# Patient Record
Sex: Female | Born: 1960 | Race: Black or African American | Hispanic: No | Marital: Single | Smoking: Current every day smoker
Health system: Southern US, Community
[De-identification: ages and names within clinical notes are randomized; demographics above are authoritative.]

## PROBLEM LIST (undated history)

## (undated) DIAGNOSIS — R011 Cardiac murmur, unspecified: Secondary | ICD-10-CM

## (undated) DIAGNOSIS — N393 Stress incontinence (female) (male): Secondary | ICD-10-CM

## (undated) DIAGNOSIS — K219 Gastro-esophageal reflux disease without esophagitis: Secondary | ICD-10-CM

## (undated) DIAGNOSIS — I10 Essential (primary) hypertension: Secondary | ICD-10-CM

## (undated) DIAGNOSIS — E079 Disorder of thyroid, unspecified: Secondary | ICD-10-CM

## (undated) DIAGNOSIS — E039 Hypothyroidism, unspecified: Secondary | ICD-10-CM

## (undated) DIAGNOSIS — N8110 Cystocele, unspecified: Secondary | ICD-10-CM

## (undated) DIAGNOSIS — R928 Other abnormal and inconclusive findings on diagnostic imaging of breast: Secondary | ICD-10-CM

## (undated) HISTORY — PX: ROTATOR CUFF REPAIR: SHX139

## (undated) HISTORY — PX: THYROIDECTOMY: SHX17

---

## 2005-11-26 ENCOUNTER — Ambulatory Visit: Payer: Self-pay | Admitting: Otolaryngology

## 2005-11-30 ENCOUNTER — Other Ambulatory Visit: Payer: Self-pay

## 2005-12-10 ENCOUNTER — Ambulatory Visit: Payer: Self-pay | Admitting: Otolaryngology

## 2008-01-22 ENCOUNTER — Emergency Department (HOSPITAL_COMMUNITY): Admission: EM | Admit: 2008-01-22 | Discharge: 2008-01-22 | Payer: Self-pay | Admitting: Emergency Medicine

## 2008-02-02 ENCOUNTER — Emergency Department (HOSPITAL_COMMUNITY): Admission: EM | Admit: 2008-02-02 | Discharge: 2008-02-02 | Payer: Self-pay | Admitting: Emergency Medicine

## 2008-06-28 ENCOUNTER — Ambulatory Visit (HOSPITAL_COMMUNITY): Admission: RE | Admit: 2008-06-28 | Discharge: 2008-06-28 | Payer: Self-pay | Admitting: Family Medicine

## 2009-03-23 ENCOUNTER — Emergency Department (HOSPITAL_COMMUNITY): Admission: EM | Admit: 2009-03-23 | Discharge: 2009-03-23 | Payer: Self-pay | Admitting: Emergency Medicine

## 2010-02-21 ENCOUNTER — Ambulatory Visit (HOSPITAL_COMMUNITY): Admission: RE | Admit: 2010-02-21 | Discharge: 2010-02-21 | Payer: Self-pay | Admitting: Internal Medicine

## 2010-04-02 ENCOUNTER — Ambulatory Visit (HOSPITAL_COMMUNITY)
Admission: RE | Admit: 2010-04-02 | Discharge: 2010-04-02 | Payer: Self-pay | Source: Home / Self Care | Attending: Internal Medicine | Admitting: Internal Medicine

## 2010-05-18 ENCOUNTER — Encounter: Payer: Self-pay | Admitting: Internal Medicine

## 2010-08-07 ENCOUNTER — Other Ambulatory Visit (HOSPITAL_COMMUNITY): Payer: Self-pay | Admitting: Nurse Practitioner

## 2010-08-07 DIAGNOSIS — Z09 Encounter for follow-up examination after completed treatment for conditions other than malignant neoplasm: Secondary | ICD-10-CM

## 2010-10-08 ENCOUNTER — Ambulatory Visit (HOSPITAL_COMMUNITY): Payer: BC Managed Care – PPO

## 2010-10-08 ENCOUNTER — Other Ambulatory Visit (HOSPITAL_COMMUNITY): Payer: BC Managed Care – PPO

## 2010-11-05 ENCOUNTER — Ambulatory Visit (HOSPITAL_COMMUNITY)
Admission: RE | Admit: 2010-11-05 | Discharge: 2010-11-05 | Disposition: A | Discharge status: Home / Self Care | Payer: BC Managed Care – PPO | Source: Ambulatory Visit | Attending: Nurse Practitioner | Admitting: Nurse Practitioner

## 2010-11-05 DIAGNOSIS — Z09 Encounter for follow-up examination after completed treatment for conditions other than malignant neoplasm: Secondary | ICD-10-CM

## 2010-11-05 DIAGNOSIS — N63 Unspecified lump in unspecified breast: Secondary | ICD-10-CM | POA: Insufficient documentation

## 2011-05-07 ENCOUNTER — Emergency Department (HOSPITAL_COMMUNITY)
Admission: EM | Admit: 2011-05-07 | Discharge: 2011-05-07 | Disposition: A | Discharge status: Home / Self Care | Payer: BC Managed Care – PPO | Attending: Emergency Medicine | Admitting: Emergency Medicine

## 2011-05-07 ENCOUNTER — Encounter (HOSPITAL_COMMUNITY): Payer: Self-pay | Admitting: Emergency Medicine

## 2011-05-07 ENCOUNTER — Emergency Department (HOSPITAL_COMMUNITY): Payer: BC Managed Care – PPO

## 2011-05-07 DIAGNOSIS — R6883 Chills (without fever): Secondary | ICD-10-CM | POA: Insufficient documentation

## 2011-05-07 DIAGNOSIS — R059 Cough, unspecified: Secondary | ICD-10-CM

## 2011-05-07 DIAGNOSIS — IMO0001 Reserved for inherently not codable concepts without codable children: Secondary | ICD-10-CM | POA: Insufficient documentation

## 2011-05-07 DIAGNOSIS — J3489 Other specified disorders of nose and nasal sinuses: Secondary | ICD-10-CM | POA: Insufficient documentation

## 2011-05-07 DIAGNOSIS — F17200 Nicotine dependence, unspecified, uncomplicated: Secondary | ICD-10-CM | POA: Insufficient documentation

## 2011-05-07 DIAGNOSIS — R05 Cough: Secondary | ICD-10-CM | POA: Insufficient documentation

## 2011-05-07 DIAGNOSIS — R6889 Other general symptoms and signs: Secondary | ICD-10-CM | POA: Insufficient documentation

## 2011-05-07 DIAGNOSIS — Z794 Long term (current) use of insulin: Secondary | ICD-10-CM | POA: Insufficient documentation

## 2011-05-07 MED ORDER — AZITHROMYCIN 250 MG PO TABS
500.0000 mg | ORAL_TABLET | Freq: Once | ORAL | Status: AC
  Administered 2011-05-07: 500 mg via ORAL
  Filled 2011-05-07: qty 2

## 2011-05-07 MED ORDER — HYDROCOD POLST-CHLORPHEN POLST 10-8 MG/5ML PO LQCR: 5.0000 mL | Freq: Two times a day (BID) | ORAL | Status: DC | PRN

## 2011-05-07 MED ORDER — AZITHROMYCIN 250 MG PO TABS: ORAL_TABLET | ORAL | Status: DC

## 2011-05-07 MED ORDER — IBUPROFEN 800 MG PO TABS
800.0000 mg | ORAL_TABLET | Freq: Once | ORAL | Status: AC
  Administered 2011-05-07: 800 mg via ORAL
  Filled 2011-05-07: qty 1

## 2011-05-07 MED ORDER — IPRATROPIUM BROMIDE 0.02 % IN SOLN
0.5000 mg | Freq: Once | RESPIRATORY_TRACT | Status: AC
  Administered 2011-05-07: 0.5 mg via RESPIRATORY_TRACT
  Filled 2011-05-07: qty 2.5

## 2011-05-07 MED ORDER — ALBUTEROL SULFATE HFA 108 (90 BASE) MCG/ACT IN AERS
2.0000 | INHALATION_SPRAY | RESPIRATORY_TRACT | Status: DC | PRN
  Administered 2011-05-07: 2 via RESPIRATORY_TRACT
  Filled 2011-05-07: qty 6.7

## 2011-05-07 MED ORDER — ALBUTEROL SULFATE (5 MG/ML) 0.5% IN NEBU
5.0000 mg | INHALATION_SOLUTION | Freq: Once | RESPIRATORY_TRACT | Status: AC
  Administered 2011-05-07: 5 mg via RESPIRATORY_TRACT
  Filled 2011-05-07: qty 1

## 2011-05-07 NOTE — ED Notes (Signed)
Pt c/o cough/congestion/body aches.

## 2011-05-07 NOTE — ED Provider Notes (Signed)
History     CSN: [id number]  Arrival date & time 05/07/11  1316   First MD Initiated Contact with Patient 05/07/11 1608      Chief Complaint  Patient presents with  . Cough  . Nasal Congestion  . Generalized Body Aches    (Consider location/radiation/quality/duration/timing/severity/associated sxs/prior treatment) Patient is a 51 y.o. female presenting with cough. The history is provided by the patient. No language interpreter was used.  Cough This is a new problem. The current episode started more than 2 days ago. The problem occurs constantly. The problem has not changed since onset.The cough is non-productive. There has been no fever. Associated symptoms include chills, rhinorrhea and myalgias. Pertinent negatives include no ear pain, no headaches, no sore throat, no shortness of breath and no wheezing. She has tried nothing for the symptoms. The treatment provided no relief. She is a smoker. Her past medical history does not include bronchitis, pneumonia, COPD or asthma.    History reviewed. No pertinent past medical history.  Past Surgical History  Procedure Date  . Thyroidectomy   . Rotator cuff repair     History reviewed. No pertinent family history.  History  Substance Use Topics  . Smoking status: Current Everyday Smoker  . Smokeless tobacco: Not on file  . Alcohol Use: No    OB History    Grav Para Term Preterm Abortions TAB SAB Ect Mult Living                  Review of Systems  Constitutional: Positive for chills. Negative for activity change and appetite change.  HENT: Positive for congestion, rhinorrhea and sneezing. Negative for ear pain, sore throat, trouble swallowing, neck pain and neck stiffness.   Eyes: Negative for visual disturbance.  Respiratory: Positive for cough. Negative for chest tightness, shortness of breath and wheezing.   Gastrointestinal: Negative for vomiting and abdominal pain.  Genitourinary: Negative for dysuria.    Musculoskeletal: Positive for myalgias. Negative for arthralgias.  Skin: Negative.   Neurological: Negative for dizziness, weakness, numbness and headaches.  All other systems reviewed and are negative.    Allergies  Review of patient's allergies indicates no known allergies.  Home Medications   Current Outpatient Rx  Name Route Sig Dispense Refill  . INSULIN ASPART 100 UNIT/ML [place] SOLN Subcutaneous Inject 5-8 Units into the skin 3 (three) times daily before meals.    . INSULIN DETEMIR 100 UNIT/ML [place] SOLN Subcutaneous Inject 10 Units into the skin at bedtime.    Marland Kitchen LOSARTAN POTASSIUM 50 MG PO TABS Oral Take 50 mg by mouth daily.    Marland Kitchen METFORMIN HCL 1000 MG PO TABS Oral Take 1,000 mg by mouth 2 (two) times daily with a meal.    . OMEPRAZOLE 20 MG PO CPDR Oral Take 20 mg by mouth daily.    Marland Kitchen PRAVASTATIN SODIUM 20 MG PO TABS Oral Take 20 mg by mouth daily.      BP 157/62  Pulse 98  Temp 99.5 F (37.5 C)  Resp 20  Ht 5\' 11"  (1.803 m)  Wt 196 lb (88.905 kg)  BMI 27.34 kg/m2  SpO2 97%  Physical Exam  Nursing note reviewed. Constitutional: She is oriented to person, place, and time. She appears well-developed and well-nourished. No distress.  HENT:  Head: Normocephalic and atraumatic.  Right Ear: Tympanic membrane and ear canal normal.  Left Ear: Tympanic membrane and ear canal normal.  Nose: Rhinorrhea present. No mucosal edema.  Mouth/Throat: Uvula is midline,  oropharynx is clear and moist and mucous membranes are normal. No oropharyngeal exudate.  Neck: Normal range of motion.  Cardiovascular: Normal rate, regular rhythm and normal heart sounds.   No murmur heard. Pulmonary/Chest: Effort normal and breath sounds normal. No respiratory distress. She has no wheezes. She has no rales.  Musculoskeletal: She exhibits no tenderness.  Lymphadenopathy:    She has no cervical adenopathy.  Neurological: She is alert and oriented to person, place, and time. She exhibits normal muscle  tone. Coordination normal.  Skin: Skin is warm and dry.    ED Course  Procedures (including critical care time)  Labs Reviewed - No data to display Dg Chest 2 View  05/07/2011  *RADIOLOGY REPORT*  Clinical Data: Cough  CHEST - 2 VIEW  Comparison: 03/23/2009, 01/22/2008  Findings: Normal heart size, mediastinal contours, and pulmonary vascularity. Minimal chronic peribronchial thickening without infiltrate or effusion. No pneumothorax. Bones unremarkable.  IMPRESSION: Minimal chronic bronchitic changes. No acute abnormalities.  Original Report Authenticated By: Lollie Marrow, M.D.        MDM    Patient is alert, NAD.  No tachypnea or hypoxia.  Non-toxic appearing.  Appears stable for d/c.  She agrees to f/u with her PMD or return here if sx's worsen  Patient / Family / Caregiver understand and agree with initial ED impression and plan with expectations set for ED visit.   Pt stable in ED with no significant deterioration in condition.       Bradrick Kamau L. Richards [place], [place] 05/09/11 2313

## 2011-05-10 NOTE — ED Provider Notes (Signed)
Medical screening examination/treatment/procedure(s) were performed by non-physician practitioner and as supervising physician I was immediately available for consultation/collaboration.   Joya Gaskins, MD 05/10/11 [phone]

## 2011-07-22 ENCOUNTER — Other Ambulatory Visit (HOSPITAL_COMMUNITY): Payer: Self-pay | Admitting: Nurse Practitioner

## 2011-07-22 DIAGNOSIS — Z09 Encounter for follow-up examination after completed treatment for conditions other than malignant neoplasm: Secondary | ICD-10-CM

## 2011-07-29 ENCOUNTER — Other Ambulatory Visit (HOSPITAL_COMMUNITY): Payer: Self-pay | Admitting: Nurse Practitioner

## 2011-07-29 ENCOUNTER — Ambulatory Visit (HOSPITAL_COMMUNITY)
Admission: RE | Admit: 2011-07-29 | Discharge: 2011-07-29 | Disposition: A | Discharge status: Home / Self Care | Payer: BC Managed Care – PPO | Source: Ambulatory Visit | Attending: Nurse Practitioner | Admitting: Nurse Practitioner

## 2011-07-29 DIAGNOSIS — Z09 Encounter for follow-up examination after completed treatment for conditions other than malignant neoplasm: Secondary | ICD-10-CM

## 2011-07-29 DIAGNOSIS — N63 Unspecified lump in unspecified breast: Secondary | ICD-10-CM | POA: Insufficient documentation

## 2011-12-30 ENCOUNTER — Encounter (HOSPITAL_COMMUNITY): Payer: Self-pay

## 2011-12-30 ENCOUNTER — Emergency Department (HOSPITAL_COMMUNITY)
Admission: EM | Admit: 2011-12-30 | Discharge: 2011-12-30 | Disposition: A | Discharge status: Home / Self Care | Payer: BC Managed Care – PPO | Attending: Emergency Medicine | Admitting: Emergency Medicine

## 2011-12-30 DIAGNOSIS — J019 Acute sinusitis, unspecified: Secondary | ICD-10-CM

## 2011-12-30 DIAGNOSIS — E119 Type 2 diabetes mellitus without complications: Secondary | ICD-10-CM | POA: Insufficient documentation

## 2011-12-30 DIAGNOSIS — F17200 Nicotine dependence, unspecified, uncomplicated: Secondary | ICD-10-CM | POA: Insufficient documentation

## 2011-12-30 DIAGNOSIS — Z79899 Other long term (current) drug therapy: Secondary | ICD-10-CM | POA: Insufficient documentation

## 2011-12-30 MED ORDER — AZITHROMYCIN 250 MG PO TABS: ORAL_TABLET | ORAL | Status: DC

## 2011-12-30 MED ORDER — FLUCONAZOLE 150 MG PO TABS: 150.0000 mg | ORAL_TABLET | Freq: Every day | ORAL | Status: AC

## 2011-12-30 NOTE — ED Notes (Signed)
Pt presents with c/o sinus pressure headache, chest discomfort, productive cough, nasal congestion and generalized body aches. Pt states became sick with sinus pressure and nasal congestion on Saturday and symptoms have progressed to URI discomfort. Pt reports coughing so much she aches every where. Denies fever, N/V/D. Pt is in no acute respiratory distress at this time.

## 2011-12-30 NOTE — ED Notes (Signed)
Pt reports nasal congestion and sinus headache, cough since sunday

## 2011-12-30 NOTE — ED Provider Notes (Signed)
History     CSN: [id number]  Arrival date & time 12/30/11  1123   First MD Initiated Contact with Patient 12/30/11 1301      Chief Complaint  Patient presents with  . Nasal Congestion  . Headache    (Consider location/radiation/quality/duration/timing/severity/associated sxs/prior treatment) HPI Comments: Monica Bell presents with a three-day history of nasal congestion, sinus headache nonproductive cough and generalized body aches.  She denies fevers and chills and has had no chest pain or shortness of breath.  She does have a history of prior sinusitis and she states her symptoms today are similar.  She has facial pressure primarily in her cheeks and across her nose.  She has had bilateral nasal congestion and nonbloody  green nasal discharge.  She does use Flonase when necessary and has been using this for the past 2 days without relief of her nasal congestion.   The history is provided by the patient.    Past Medical History  Diagnosis Date  . Diabetes mellitus     Past Surgical History  Procedure Date  . Thyroidectomy   . Rotator cuff repair     No family history on file.  History  Substance Use Topics  . Smoking status: Current Everyday Smoker    Types: Cigarettes  . Smokeless tobacco: Not on file  . Alcohol Use: No    OB History    Grav Para Term Preterm Abortions TAB SAB Ect Mult Living                  Review of Systems  Constitutional: Negative for fever.  HENT: Positive for congestion, rhinorrhea, postnasal drip and sinus pressure. Negative for sore throat, facial swelling, trouble swallowing and neck pain.   Eyes: Negative.   Respiratory: Positive for cough. Negative for chest tightness, shortness of breath, wheezing and stridor.   Cardiovascular: Negative for chest pain.  Gastrointestinal: Negative for nausea and abdominal pain.  Genitourinary: Negative.   Musculoskeletal: Negative for joint swelling and arthralgias.  Skin: Negative.   Negative for rash and wound.  Neurological: Negative for dizziness, weakness, light-headedness, numbness and headaches.  Hematological: Negative.   Psychiatric/Behavioral: Negative.     Allergies  Review of patient's allergies indicates no known allergies.  Home Medications   Current Outpatient Rx  Name Route Sig Dispense Refill  . INSULIN ASPART 100 UNIT/ML [place] SOLN Subcutaneous Inject 10 Units into the skin 4 (four) times daily -  before meals and at bedtime. MD also told her to take a little bit before snacks also    . INSULIN DETEMIR 100 UNIT/ML [place] SOLN Subcutaneous Inject 35 Units into the skin daily.    Marland Kitchen LOSARTAN POTASSIUM 50 MG PO TABS Oral Take 50 mg by mouth daily.    Marland Kitchen METFORMIN HCL 1000 MG PO TABS Oral Take 1,000 mg by mouth 2 (two) times daily with a meal.    . OMEPRAZOLE 20 MG PO CPDR Oral Take 20 mg by mouth daily.    Marland Kitchen PRAVASTATIN SODIUM 20 MG PO TABS Oral Take 20 mg by mouth at bedtime.     . AZITHROMYCIN 250 MG PO TABS  Take 2 tablets by mouth on day one followed by one tablet daily for 4 days. 6 tablet 0  . FLUCONAZOLE 150 MG PO TABS Oral Take 1 tablet (150 mg total) by mouth daily. 1 tablet 0    Take prn antibiotic induced vaginitis    BP 147/66  Pulse 80  Temp 98 F (  36.7 C) (Oral)  Resp 20  Ht 5\' 8"  (1.727 m)  Wt 196 lb (88.905 kg)  BMI 29.80 kg/m2  SpO2 99%  Physical Exam  Nursing note and vitals reviewed. Constitutional: She appears well-developed and well-nourished.  HENT:  Head: Normocephalic and atraumatic.  Right Ear: External ear normal.  Left Ear: External ear normal.  Nose: Mucosal edema and rhinorrhea present. Right sinus exhibits maxillary sinus tenderness. Left sinus exhibits maxillary sinus tenderness.  Mouth/Throat: Uvula is midline, oropharynx is clear and moist and mucous membranes are normal.  Eyes: Conjunctivae are normal.  Neck: Normal range of motion.  Cardiovascular: Normal rate, regular rhythm, normal heart sounds and intact  distal pulses.   Pulmonary/Chest: Effort normal and breath sounds normal. No respiratory distress. She has no wheezes. She has no rales.  Abdominal: Soft.  Musculoskeletal: Normal range of motion.  Neurological: She is alert.  Skin: Skin is warm and dry.  Psychiatric: She has a normal mood and affect.    ED Course  Procedures (including critical care time)  Labs Reviewed - No data to display No results found.   1. Sinusitis, acute       MDM  Patient with acute sinusitis by history and exam.  Discussed treatment with antibiotics.  Patient deferred on amoxicillin or Augmentin stating to Zithromax works better for her.  Also states she typically will have a yeast infection while being on antibiotics so she was also prescribed Diflucan tablet.  She was encouraged to continue using her Flonase, also continue with warm compresses to her face, Tylenol for pain relief, menthol cough drops for temporary nasal decongestant purposes.  Follow up with PCP if not improving over the next several days.        Burgess Amor, PA 12/30/11 1710

## 2011-12-31 NOTE — ED Provider Notes (Signed)
Medical screening examination/treatment/procedure(s) were performed by non-physician practitioner and as supervising physician I was immediately available for consultation/collaboration. Zekiah Coen, MD, FACEP   Alize Acy L Blue Ruggerio, MD 12/31/11 1531 

## 2012-02-26 DIAGNOSIS — IMO0002 Reserved for concepts with insufficient information to code with codable children: Secondary | ICD-10-CM | POA: Insufficient documentation

## 2012-02-26 DIAGNOSIS — F17200 Nicotine dependence, unspecified, uncomplicated: Secondary | ICD-10-CM | POA: Insufficient documentation

## 2012-02-26 DIAGNOSIS — N393 Stress incontinence (female) (male): Secondary | ICD-10-CM | POA: Insufficient documentation

## 2012-02-26 DIAGNOSIS — E039 Hypothyroidism, unspecified: Secondary | ICD-10-CM | POA: Insufficient documentation

## 2012-02-26 DIAGNOSIS — G8929 Other chronic pain: Secondary | ICD-10-CM | POA: Insufficient documentation

## 2012-11-08 ENCOUNTER — Other Ambulatory Visit (HOSPITAL_COMMUNITY): Payer: Self-pay | Admitting: Family Medicine

## 2012-11-08 DIAGNOSIS — Z139 Encounter for screening, unspecified: Secondary | ICD-10-CM

## 2012-11-14 ENCOUNTER — Ambulatory Visit (HOSPITAL_COMMUNITY)
Admission: RE | Admit: 2012-11-14 | Discharge: 2012-11-14 | Disposition: A | Discharge status: Home / Self Care | Payer: BC Managed Care – PPO | Source: Ambulatory Visit | Attending: Family Medicine | Admitting: Family Medicine

## 2012-11-14 DIAGNOSIS — Z139 Encounter for screening, unspecified: Secondary | ICD-10-CM

## 2012-11-14 DIAGNOSIS — Z1231 Encounter for screening mammogram for malignant neoplasm of breast: Secondary | ICD-10-CM | POA: Insufficient documentation

## 2013-04-02 IMAGING — US US BREAST*L*
1 series · 5 of 5 positions shown · non-contrast
Comparison: Prior mammograms and ultrasound dated 04/02/2010

On physical exam, I do not palpate a mass in the left breast.

CLINICAL DATA: Short-term interval follow-up of a probable benign
lesion in the left breast

LEFT BREAST ULTRASOUND

[Series 1: us breast*left* · 0.09mm/px · 5 of 5 slices shown]
[im 1/5]
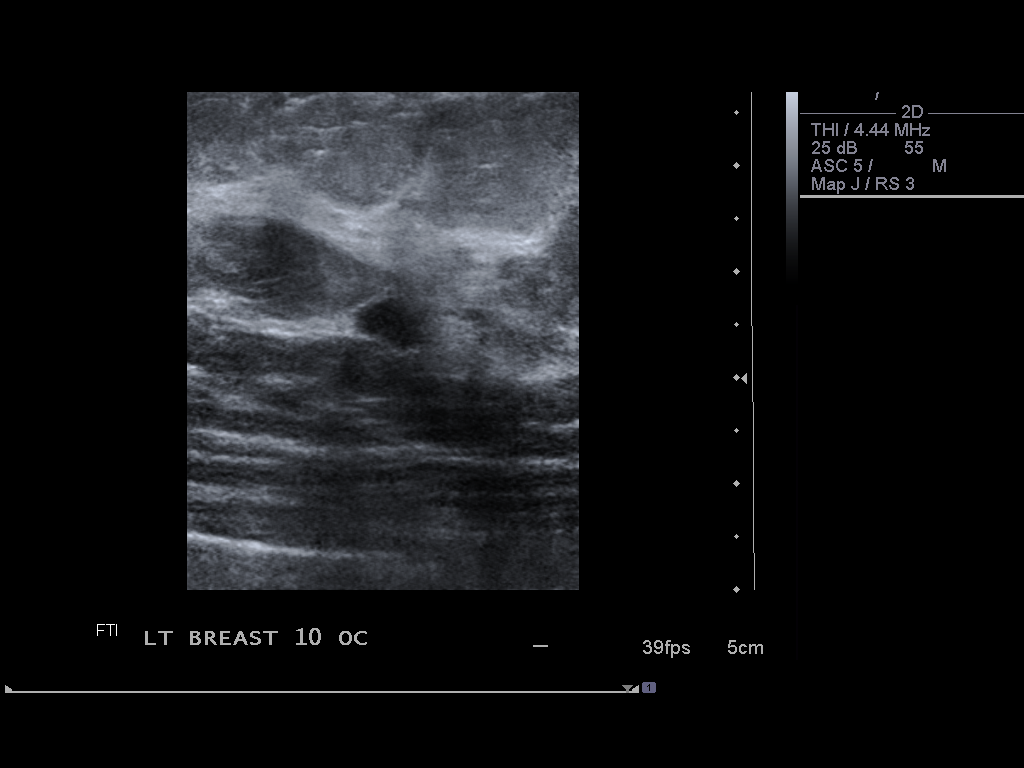
[im 2/5]
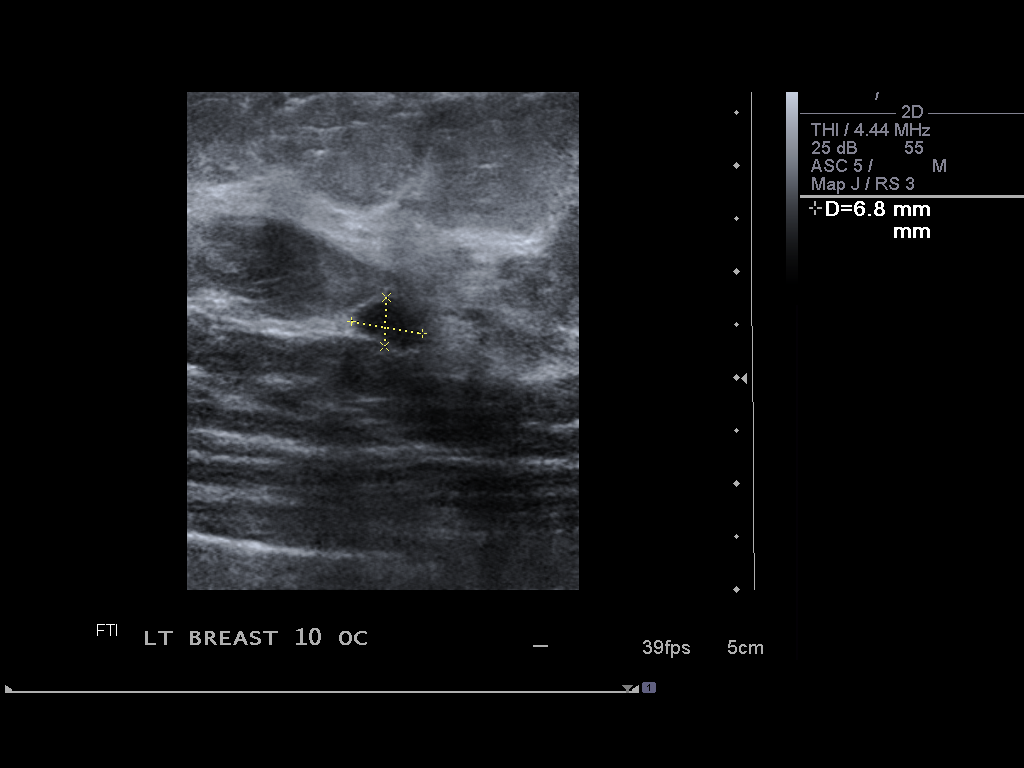
[im 3/5]
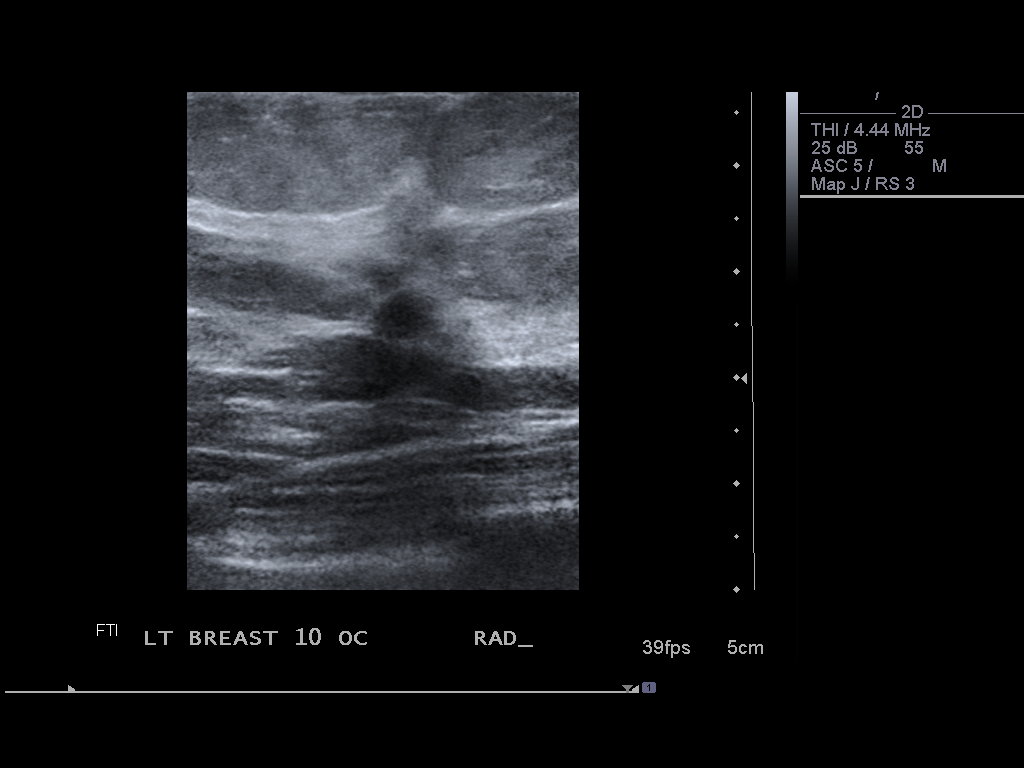
[im 4/5]
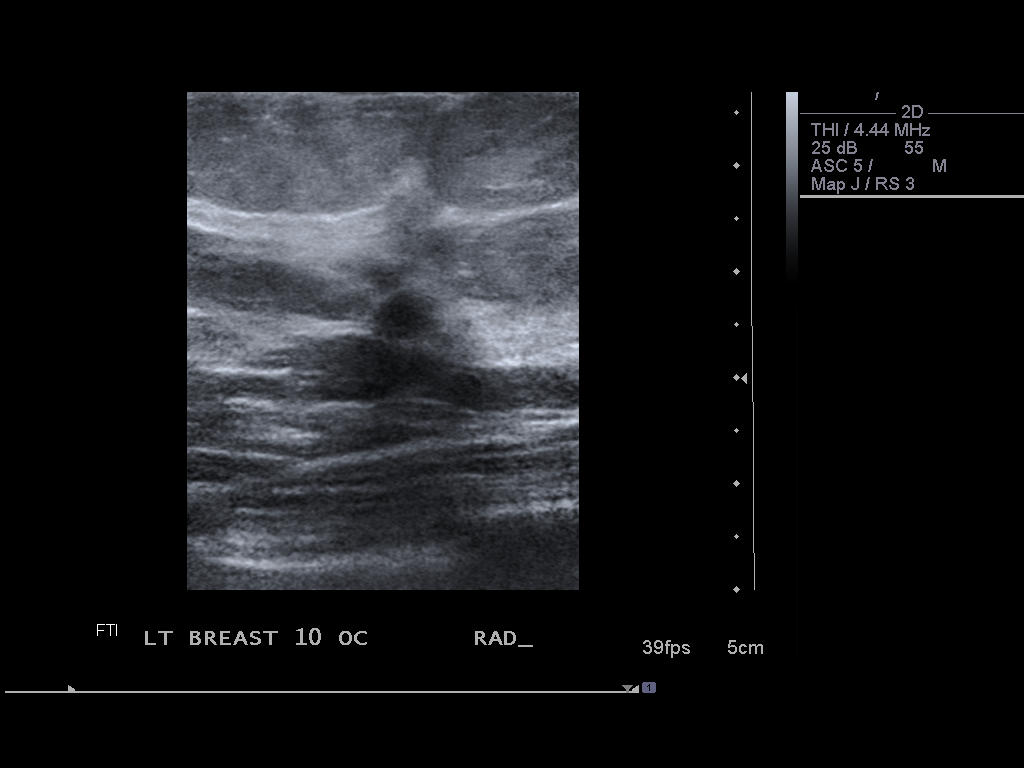
[im 5/5]
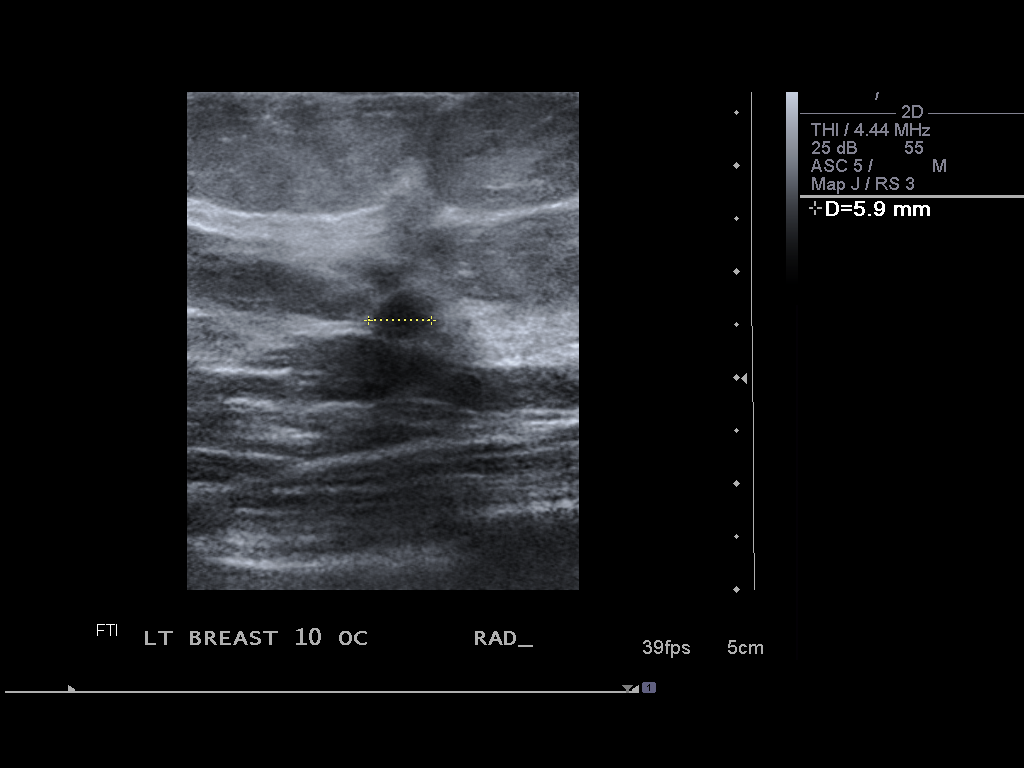

[5 of 5 positions shown; findings below may reference images not displayed]

FINDINGS: Ultrasound is performed, showing there is a hypoechoic
lesion in the left breast at 10 o'clock 5 cm from the nipple
measuring 7 x 5 x 6 mm.  On the prior ultrasound dated 04/02/2010
this measured 9 mm.  This likely a benign cyst.
IMPRESSION: Probable benign lesion in the left breast.  Short-term interval
follow-up bilateral diagnostic mammogram and left breast ultrasound
in March 2011 is recommended

BI-RADS CATEGORY 3:  Probably benign finding(s) - short interval
follow-up suggested.

## 2013-06-04 ENCOUNTER — Encounter (HOSPITAL_COMMUNITY): Payer: Self-pay | Admitting: Emergency Medicine

## 2013-06-04 ENCOUNTER — Emergency Department (HOSPITAL_COMMUNITY)
Admission: EM | Admit: 2013-06-04 | Discharge: 2013-06-04 | Disposition: A | Discharge status: Home / Self Care | Payer: BC Managed Care – PPO | Attending: Emergency Medicine | Admitting: Emergency Medicine

## 2013-06-04 DIAGNOSIS — F17200 Nicotine dependence, unspecified, uncomplicated: Secondary | ICD-10-CM | POA: Insufficient documentation

## 2013-06-04 DIAGNOSIS — Z794 Long term (current) use of insulin: Secondary | ICD-10-CM | POA: Insufficient documentation

## 2013-06-04 DIAGNOSIS — Z79899 Other long term (current) drug therapy: Secondary | ICD-10-CM | POA: Insufficient documentation

## 2013-06-04 DIAGNOSIS — R52 Pain, unspecified: Secondary | ICD-10-CM | POA: Insufficient documentation

## 2013-06-04 DIAGNOSIS — I10 Essential (primary) hypertension: Secondary | ICD-10-CM | POA: Insufficient documentation

## 2013-06-04 DIAGNOSIS — J329 Chronic sinusitis, unspecified: Secondary | ICD-10-CM

## 2013-06-04 DIAGNOSIS — E119 Type 2 diabetes mellitus without complications: Secondary | ICD-10-CM | POA: Insufficient documentation

## 2013-06-04 LAB — GLUCOSE, CAPILLARY: Glucose-Capillary: 136 mg/dL — ABNORMAL HIGH (ref 70–99)

## 2013-06-04 MED ORDER — AZITHROMYCIN 250 MG PO TABS
500.0000 mg | ORAL_TABLET | Freq: Every day | ORAL | Status: DC
  Administered 2013-06-04: 500 mg via ORAL
  Filled 2013-06-04: qty 2

## 2013-06-04 MED ORDER — AZITHROMYCIN 250 MG PO TABS: ORAL_TABLET | ORAL | Status: DC

## 2013-06-04 NOTE — ED Provider Notes (Signed)
Medical screening examination/treatment/procedure(s) were performed by non-physician practitioner and as supervising physician I was immediately available for consultation/collaboration.  EKG Interpretation   None         Elienai Gailey, MD 06/04/13 2332 

## 2013-06-04 NOTE — ED Notes (Addendum)
Pt c/o nasal congestion, sinus congestion, facial and generalized body aches, fever that started this week, was seen by pcp on Wednesday and advised "it will run it's course" pt states that she is not getting any better, denies any n/v/d, cough,

## 2013-06-04 NOTE — ED Provider Notes (Signed)
CSN: [id number]     Arrival date & time 06/04/13  1454 History  This chart was scribed for non-physician practitioner, Kem Parkinson, PA-C,working with Ezequiel Essex, MD, by Marlowe Kays, ED Scribe.  This patient was seen in room APFT21/APFT21 and the patient's care was started at 3:44 PM.  Chief Complaint  Patient presents with  . Facial Pain  . Nasal Congestion   The history is provided by the patient. No language interpreter was used.   HPI Comments:  Monica Bell is a 53 y.o. female who presents to the Emergency Department complaining of generalized body aches, fatigue, congestion, and facial pain that started approximately one week ago. Pt states she went to her PCP at [facility] three days ago and was prescribed Tussionex. She states she is still experiencing nasal congestion and sneezing. She reports fever 2-3 days ago, but does not have one now. She reports associated chest tightness secondary to excessive coughing and sneezing. She reports sneezing wakes her at night. She reports a sick contact stating her daughter has had a severe cough, but states she does not feel it is the same type of illness. She denies sore throat, current fever, abdominal pain or SOB. She reports h/o DM and HTN. She reports her CBGs have been fluctuating since she got sick, but nothing extremely elevated.  Past Medical History  Diagnosis Date  . Diabetes mellitus    Past Surgical History  Procedure Laterality Date  . Thyroidectomy    . Rotator cuff repair     No family history on file. History  Substance Use Topics  . Smoking status: Current Every Day Smoker    Types: Cigarettes  . Smokeless tobacco: Not on file  . Alcohol Use: No   OB History   Grav Para Term Preterm Abortions TAB SAB Ect Mult Living                 Review of Systems  Constitutional: Negative for fever, chills and fatigue.       Generalized body aches  HENT: Positive for congestion (nasal) and sinus  pressure. Negative for sore throat and trouble swallowing.   Respiratory: Positive for cough. Negative for shortness of breath and wheezing.   Cardiovascular: Negative for chest pain and palpitations.  Gastrointestinal: Negative for nausea, vomiting, abdominal pain and blood in stool.  Genitourinary: Negative for dysuria, hematuria and flank pain.  Musculoskeletal: Negative for arthralgias, back pain, myalgias, neck pain and neck stiffness.  Skin: Negative for rash.  Neurological: Negative for dizziness, weakness and numbness.  Hematological: Does not bruise/bleed easily.  All other systems reviewed and are negative.    Allergies  Review of patient's allergies indicates no known allergies.  Home Medications   Current Outpatient Rx  Name  Route  Sig  Dispense  Refill  . azithromycin (ZITHROMAX Z-PAK) 250 MG tablet      Take 2 tablets by mouth on day one followed by one tablet daily for 4 days.   6 tablet   0   . insulin aspart (NOVOLOG) 100 UNIT/ML injection   Subcutaneous   Inject 10 Units into the skin 4 (four) times daily -  before meals and at bedtime. MD also told her to take a little bit before snacks also         . insulin detemir (LEVEMIR) 100 UNIT/ML injection   Subcutaneous   Inject 35 Units into the skin daily.         Marland Kitchen losartan (COZAAR)  50 MG tablet   Oral   Take 50 mg by mouth daily.         . metFORMIN (GLUCOPHAGE) 1000 MG tablet   Oral   Take 1,000 mg by mouth 2 (two) times daily with a meal.         . omeprazole (PRILOSEC) 20 MG capsule   Oral   Take 20 mg by mouth daily.         . pravastatin (PRAVACHOL) 20 MG tablet   Oral   Take 20 mg by mouth at bedtime.           Triage Vitals: BP 129/72  Pulse 96  Temp(Src) 98.9 F (37.2 C) (Oral)  Resp 20  Ht 5\' 11"  (1.803 m)  Wt 198 lb (89.812 kg)  BMI 27.63 kg/m2  SpO2 98% Physical Exam  Nursing note and vitals reviewed. Constitutional: She is oriented to person, place, and time. She  appears well-developed and well-nourished. No distress.  HENT:  Head: Normocephalic and atraumatic.  Right Ear: Hearing, tympanic membrane, external ear and ear canal normal.  Left Ear: Hearing, tympanic membrane, external ear and ear canal normal.  Nose: Mucosal edema (slightly boggy) present. No rhinorrhea or nasal deformity. Right sinus exhibits frontal sinus tenderness. Left sinus exhibits frontal sinus tenderness.  Mouth/Throat: Uvula is midline, oropharynx is clear and moist and mucous membranes are normal. No trismus in the jaw. No uvula swelling. No oropharyngeal exudate, posterior oropharyngeal edema, posterior oropharyngeal erythema or tonsillar abscesses.  Eyes: Conjunctivae are normal.  Neck: Normal range of motion and phonation normal. Neck supple. No Brudzinski's sign and no Kernig's sign noted.  Cardiovascular: Normal rate, regular rhythm, normal heart sounds and intact distal pulses.   No murmur heard. Pulmonary/Chest: Effort normal and breath sounds normal. No respiratory distress. She has no wheezes. She has no rales. She exhibits no tenderness.  Abdominal: Soft. She exhibits no distension. There is no tenderness. There is no rebound and no guarding.  Musculoskeletal: Normal range of motion. She exhibits no edema.  Lymphadenopathy:    She has no cervical adenopathy.  Neurological: She is alert and oriented to person, place, and time. She exhibits normal muscle tone. Coordination normal.  Skin: Skin is warm and dry.    ED Course  Procedures (including critical care time) DIAGNOSTIC STUDIES: Oxygen Saturation is 98% on RA, normal by my interpretation.   COORDINATION OF CARE: 3:52 PM- Will prescribe Zithromax and advised pt to try OTC nascort. Advised pt to be mindful of her CBG levels and discontinue Nasacort if they elevate. Pt verbalizes understanding and agrees to plan.  Medications - No data to display  Labs Review Labs Reviewed  GLUCOSE, CAPILLARY - Abnormal;  Notable for the following:    Glucose-Capillary 136 (*)    All other components within normal limits   Imaging Review No results found.  EKG Interpretation   None       MDM  Pt is well appearing, VSS.  Likely sinusitis.  Reported chest pain is associated with excessive cough and sneezing only.  Wells PE score is 0.  No dyspnea. Patient agrees to zithromax and continue Tussionex . She appears stable for discharge.   I personally performed the services described in this documentation, which was scribed in my presence. The recorded information has been reviewed and is accurate.    Ionia Schey L. Vanessa Mulberry, PA-C 06/04/13 1637

## 2013-06-04 NOTE — Discharge Instructions (Signed)

## 2013-07-22 ENCOUNTER — Emergency Department (HOSPITAL_COMMUNITY)
Admission: EM | Admit: 2013-07-22 | Discharge: 2013-07-23 | Disposition: A | Discharge status: Home / Self Care | Payer: BC Managed Care – PPO | Attending: Emergency Medicine | Admitting: Emergency Medicine

## 2013-07-22 ENCOUNTER — Encounter (HOSPITAL_COMMUNITY): Payer: Self-pay | Admitting: Emergency Medicine

## 2013-07-22 ENCOUNTER — Emergency Department (HOSPITAL_COMMUNITY): Payer: BC Managed Care – PPO

## 2013-07-22 DIAGNOSIS — F17200 Nicotine dependence, unspecified, uncomplicated: Secondary | ICD-10-CM | POA: Insufficient documentation

## 2013-07-22 DIAGNOSIS — Z79899 Other long term (current) drug therapy: Secondary | ICD-10-CM | POA: Insufficient documentation

## 2013-07-22 DIAGNOSIS — E119 Type 2 diabetes mellitus without complications: Secondary | ICD-10-CM | POA: Insufficient documentation

## 2013-07-22 DIAGNOSIS — J9801 Acute bronchospasm: Secondary | ICD-10-CM

## 2013-07-22 DIAGNOSIS — J209 Acute bronchitis, unspecified: Secondary | ICD-10-CM | POA: Insufficient documentation

## 2013-07-22 DIAGNOSIS — J40 Bronchitis, not specified as acute or chronic: Secondary | ICD-10-CM

## 2013-07-22 DIAGNOSIS — E079 Disorder of thyroid, unspecified: Secondary | ICD-10-CM | POA: Insufficient documentation

## 2013-07-22 DIAGNOSIS — Z792 Long term (current) use of antibiotics: Secondary | ICD-10-CM | POA: Insufficient documentation

## 2013-07-22 DIAGNOSIS — Z794 Long term (current) use of insulin: Secondary | ICD-10-CM | POA: Insufficient documentation

## 2013-07-22 HISTORY — DX: Disorder of thyroid, unspecified: E07.9

## 2013-07-22 LAB — CBC WITH DIFFERENTIAL/PLATELET
Basophils Absolute: 0.1 10*3/uL (ref 0.0–0.1)
Basophils Relative: 1 % (ref 0–1)
Eosinophils Absolute: 0.5 10*3/uL (ref 0.0–0.7)
Eosinophils Relative: 6 % — ABNORMAL HIGH (ref 0–5)
HCT: 37.6 % (ref 36.0–46.0)
Hemoglobin: 12.3 g/dL (ref 12.0–15.0)
Lymphocytes Relative: 50 % — ABNORMAL HIGH (ref 12–46)
Lymphs Abs: 4.2 10*3/uL — ABNORMAL HIGH (ref 0.7–4.0)
MCH: 28 pg (ref 26.0–34.0)
MCHC: 32.7 g/dL (ref 30.0–36.0)
MCV: 85.6 fL (ref 78.0–100.0)
Monocytes Absolute: 0.8 10*3/uL (ref 0.1–1.0)
Monocytes Relative: 9 % (ref 3–12)
Neutro Abs: 2.9 10*3/uL (ref 1.7–7.7)
Neutrophils Relative %: 35 % — ABNORMAL LOW (ref 43–77)
Platelets: 319 10*3/uL (ref 150–400)
RBC: 4.39 MIL/uL (ref 3.87–5.11)
RDW: 15.2 % (ref 11.5–15.5)
WBC: 8.5 10*3/uL (ref 4.0–10.5)

## 2013-07-22 MED ORDER — IPRATROPIUM BROMIDE 0.02 % IN SOLN
0.5000 mg | Freq: Once | RESPIRATORY_TRACT | Status: AC
  Administered 2013-07-22: 0.5 mg via RESPIRATORY_TRACT
  Filled 2013-07-22: qty 2.5

## 2013-07-22 MED ORDER — ALBUTEROL SULFATE (2.5 MG/3ML) 0.083% IN NEBU
5.0000 mg | INHALATION_SOLUTION | Freq: Once | RESPIRATORY_TRACT | Status: AC
  Administered 2013-07-22: 5 mg via RESPIRATORY_TRACT
  Filled 2013-07-22: qty 6

## 2013-07-22 MED ORDER — MORPHINE SULFATE 4 MG/ML IJ SOLN
4.0000 mg | Freq: Once | INTRAMUSCULAR | Status: AC
  Administered 2013-07-22: 4 mg via INTRAVENOUS
  Filled 2013-07-22: qty 1

## 2013-07-22 MED ORDER — SODIUM CHLORIDE 0.9 % IV BOLUS (SEPSIS)
1000.0000 mL | Freq: Once | INTRAVENOUS | Status: AC
  Administered 2013-07-22: 1000 mL via INTRAVENOUS

## 2013-07-22 MED ORDER — KETOROLAC TROMETHAMINE 30 MG/ML IJ SOLN
30.0000 mg | Freq: Once | INTRAMUSCULAR | Status: AC
  Administered 2013-07-22: 30 mg via INTRAVENOUS
  Filled 2013-07-22: qty 1

## 2013-07-22 NOTE — ED Notes (Signed)
Pt c/o chest congestion, cough, headache, sinus pressure x 1 wk. Pt started having intermittent chest pain and legs cramping since earlier today.

## 2013-07-22 NOTE — ED Provider Notes (Signed)
CSN: [id number]     Arrival date & time 07/22/13  2136 History  This chart was scribed for Hoy Morn, MD by Zettie Pho, ED Scribe. This patient was seen in room APA02/APA02 and the patient's care was started at 11:12 PM.    Chief Complaint  Patient presents with  . Chest Pain   The history is provided by the patient. No language interpreter was used.   HPI Comments: Monica Bell is a 53 y.o. female who presents to the Emergency Department complaining of an intermittent pain to the substernal region of the chest that she states radiates to the left pectoral region onset earlier today. Patient states that the pain is exacerbated with movement and coughing.  She denies taking any medications at home to treat her pain. Patient reports some associated exertional shortness of breath onset about 4-5 days ago. She reports associated congestion, dry cough, sinus pressure, and chills onset about a week ago that she states has been progressively worsening. She denies sore throat. Patient reports that she did receive a flu vaccination this year. Patient has a history of DM and thyroid disease. She denies personal or familial history of cardiac problems. Patient is a current, every day smoker.   Past Medical History  Diagnosis Date  . Diabetes mellitus   . Thyroid disease    Past Surgical History  Procedure Laterality Date  . Thyroidectomy    . Rotator cuff repair     History reviewed. No pertinent family history. History  Substance Use Topics  . Smoking status: Current Every Day Smoker    Types: Cigarettes  . Smokeless tobacco: Not on file  . Alcohol Use: No   OB History   Grav Para Term Preterm Abortions TAB SAB Ect Mult Living                 Review of Systems  A complete 10 system review of systems was obtained and all systems are negative except as noted in the HPI and PMH.    Allergies  Review of patient's allergies indicates no known allergies.  Home Medications    Current Outpatient Rx  Name  Route  Sig  Dispense  Refill  . azithromycin (ZITHROMAX Z-PAK) 250 MG tablet      Take two tablets on day one, then one tab qd days 2-5   6 tablet   0   . azithromycin (ZITHROMAX Z-PAK) 250 MG tablet   Oral   Take 1 tablet (250 mg total) by mouth daily. Take 2 tabs for first dose, then 1 tab for each additional dose   6 tablet   0   . chlorpheniramine-HYDROcodone (TUSSIONEX) 10-8 MG/5ML LQCR   Oral   Take 5 mLs by mouth 2 (two) times daily.         . insulin aspart (NOVOLOG) 100 UNIT/ML injection   Subcutaneous   Inject 2-35 Units into the skin 4 (four) times daily -  before meals and at bedtime. MD also told her to take a little bit before snacks also         . insulin detemir (LEVEMIR) 100 UNIT/ML injection   Subcutaneous   Inject 10 Units into the skin daily.          Marland Kitchen losartan (COZAAR) 50 MG tablet   Oral   Take 50 mg by mouth daily.         . metFORMIN (GLUCOPHAGE) 1000 MG tablet   Oral   Take 1,000 mg  by mouth 2 (two) times daily with a meal.         . SYNTHROID 150 MCG tablet   Oral   Take 150 mcg by mouth daily.          BP 179/71  Pulse 63  Temp(Src) 97.7 F (36.5 C) (Oral)  Resp 20  Ht 5\' 11"  (1.803 m)  Wt 198 lb (89.812 kg)  BMI 27.63 kg/m2  SpO2 95%  Physical Exam  Nursing note and vitals reviewed. Constitutional: She is oriented to person, place, and time. She appears well-developed and well-nourished.  HENT:  Head: Normocephalic.  Eyes: EOM are normal.  Neck: Normal range of motion.  Cardiovascular: Normal rate, regular rhythm and normal heart sounds.   Pulmonary/Chest: Effort normal and breath sounds normal. No respiratory distress. She has no wheezes. She has no rhonchi. She exhibits tenderness.  Mild tenderness to palpation to the anterior chest wall.   Abdominal: Soft. She exhibits no distension. There is no tenderness.  Musculoskeletal: Normal range of motion.  Neurological: She is alert and  oriented to person, place, and time.  Psychiatric: She has a normal mood and affect.    ED Course  Procedures (including critical care time)  DIAGNOSTIC STUDIES: Oxygen Saturation is 95% on room air, adequate by my interpretation.    COORDINATION OF CARE: 11:19 PM- Will order a chest x-ray, CBC, BMP, and troponin. Will order a Proventil and Atrovent breathing treatment, Toradol, and morphine to manage symptoms. Discussed treatment plan with patient at bedside and patient verbalized agreement.     Labs Review Labs Reviewed  CBC WITH DIFFERENTIAL - Abnormal; Notable for the following:    Neutrophils Relative % 35 (*)    Lymphocytes Relative 50 (*)    Lymphs Abs 4.2 (*)    Eosinophils Relative 6 (*)    All other components within normal limits  BASIC METABOLIC PANEL - Abnormal; Notable for the following:    Glucose, Bld 179 (*)    GFR calc non Af Amer 81 (*)    All other components within normal limits  TROPONIN I   Imaging Review Dg Chest 2 View  07/23/2013   CLINICAL DATA:  Chest pain and weakness, fever and chills.  EXAM: CHEST  2 VIEW  COMPARISON:  DG CHEST 2 VIEW dated 05/07/2011  FINDINGS: Cardiomediastinal silhouette is unremarkable. Mild chronic interstitial changes there is molar. The lungs are otherwise clear without pleural effusions or focal consolidations. Trachea projects midline and there is no pneumothorax. Soft tissue planes and included osseous structures are non-suspicious. Suture anchors in right humeral head.  IMPRESSION: Stable mild interstitial prominence may reflect reactive airway disease, stable. No pneumonia.   Electronically Signed   By: Elon Alas   On: 07/23/2013 01:01  I personally reviewed the imaging tests through PACS system I reviewed available ER/hospitalization records through the EMR    EKG Interpretation   Date/Time:  Saturday July 22 2013 22:42:00 EDT Ventricular Rate:  63 PR Interval:  150 QRS Duration: 76 QT Interval:  416 QTC  Calculation: 425 R Axis:   47 Text Interpretation:  Normal sinus rhythm Low voltage QRS Septal infarct ,  age undetermined Abnormal ECG No previous ECGs available Confirmed by COOK   MD, BRIAN ([id number]) on 07/22/2013 10:46:37 PM      MDM   Final diagnoses:  Bronchitis  Bronchospasm    Patient feels much better at this time.  Discharge or good condition.  Likely bronchitis with some degree of bronchospasm.  Significant improvement after albuterol.  I personally performed the services described in this documentation, which was scribed in my presence. The recorded information has been reviewed and is accurate.       Hoy Morn, MD 07/23/13 [id number]

## 2013-07-23 LAB — BASIC METABOLIC PANEL
BUN: 9 mg/dL (ref 6–23)
CO2: 27 mEq/L (ref 19–32)
Calcium: 8.8 mg/dL (ref 8.4–10.5)
Chloride: 101 mEq/L (ref 96–112)
Creatinine, Ser: 0.82 mg/dL (ref 0.50–1.10)
GFR calc Af Amer: >90 mL/min (ref >90)
GFR calc non Af Amer: 81 mL/min — ABNORMAL LOW (ref >90)
Glucose, Bld: 179 mg/dL — ABNORMAL HIGH (ref 70–99)
Potassium: 4.1 mEq/L (ref 3.7–5.3)
Sodium: 137 mEq/L (ref 137–147)

## 2013-07-23 LAB — TROPONIN I: Troponin I: <0.3 ng/mL (ref <0.30)

## 2013-07-23 MED ORDER — ALBUTEROL SULFATE HFA 108 (90 BASE) MCG/ACT IN AERS
2.0000 | INHALATION_SPRAY | RESPIRATORY_TRACT | Status: DC
  Administered 2013-07-23: 2 via RESPIRATORY_TRACT
  Filled 2013-07-23: qty 6.7

## 2013-07-23 MED ORDER — AZITHROMYCIN 250 MG PO TABS: 250.0000 mg | ORAL_TABLET | Freq: Every day | ORAL | Status: DC

## 2013-07-23 MED ORDER — ONDANSETRON HCL 4 MG/2ML IJ SOLN
4.0000 mg | Freq: Once | INTRAMUSCULAR | Status: AC
  Administered 2013-07-23: 4 mg via INTRAVENOUS
  Filled 2013-07-23: qty 2

## 2013-07-23 NOTE — ED Notes (Signed)
MD at bedside. 

## 2013-07-23 NOTE — Discharge Instructions (Signed)

## 2013-07-23 NOTE — ED Notes (Signed)
Pt. States that she is having nausea. EDP notified. Pt. States she is not feeling well enough to ambulate to bathroom. Pt. Assisted to bedside commode.

## 2013-11-03 ENCOUNTER — Other Ambulatory Visit (HOSPITAL_COMMUNITY): Payer: Self-pay | Admitting: Family Medicine

## 2013-11-03 DIAGNOSIS — Z1231 Encounter for screening mammogram for malignant neoplasm of breast: Secondary | ICD-10-CM

## 2013-11-15 ENCOUNTER — Ambulatory Visit (HOSPITAL_COMMUNITY)
Admission: RE | Admit: 2013-11-15 | Discharge: 2013-11-15 | Disposition: A | Discharge status: Home / Self Care | Payer: BC Managed Care – PPO | Source: Ambulatory Visit | Attending: Family Medicine | Admitting: Family Medicine

## 2013-11-15 DIAGNOSIS — Z1231 Encounter for screening mammogram for malignant neoplasm of breast: Secondary | ICD-10-CM

## 2014-01-01 ENCOUNTER — Encounter (HOSPITAL_COMMUNITY): Payer: Self-pay | Admitting: Emergency Medicine

## 2014-01-01 ENCOUNTER — Emergency Department (HOSPITAL_COMMUNITY)
Admission: EM | Admit: 2014-01-01 | Discharge: 2014-01-01 | Disposition: A | Discharge status: Home / Self Care | Payer: BC Managed Care – PPO | Attending: Emergency Medicine | Admitting: Emergency Medicine

## 2014-01-01 DIAGNOSIS — E079 Disorder of thyroid, unspecified: Secondary | ICD-10-CM | POA: Diagnosis not present

## 2014-01-01 DIAGNOSIS — R51 Headache: Secondary | ICD-10-CM | POA: Insufficient documentation

## 2014-01-01 DIAGNOSIS — Z79899 Other long term (current) drug therapy: Secondary | ICD-10-CM | POA: Insufficient documentation

## 2014-01-01 DIAGNOSIS — E119 Type 2 diabetes mellitus without complications: Secondary | ICD-10-CM | POA: Insufficient documentation

## 2014-01-01 DIAGNOSIS — J069 Acute upper respiratory infection, unspecified: Secondary | ICD-10-CM

## 2014-01-01 DIAGNOSIS — Z794 Long term (current) use of insulin: Secondary | ICD-10-CM | POA: Diagnosis not present

## 2014-01-01 DIAGNOSIS — F17200 Nicotine dependence, unspecified, uncomplicated: Secondary | ICD-10-CM | POA: Diagnosis not present

## 2014-01-01 MED ORDER — HYDROCOD POLST-CHLORPHEN POLST 10-8 MG/5ML PO LQCR: 5.0000 mL | Freq: Two times a day (BID) | ORAL | Status: DC | PRN

## 2014-01-01 MED ORDER — FEXOFENADINE-PSEUDOEPHED ER 60-120 MG PO TB12: 1.0000 | ORAL_TABLET | Freq: Two times a day (BID) | ORAL | Status: DC

## 2014-01-01 NOTE — ED Provider Notes (Signed)
CSN: [id number]     Arrival date & time 01/01/14  1749 History   First MD Initiated Contact with Patient 01/01/14 1847   This chart was scribed for non-physician practitioner Lily Kocher, PA-C, working with Dorie Rank, MD by Rosary Lively, ED scribe. This patient was seen in room APFT22/APFT22 and the patient's care was started at 6:56 PM.    Chief Complaint  Patient presents with  . URI   Patient is a 53 y.o. female presenting with URI. The history is provided by the patient. No language interpreter was used.  URI Presenting symptoms: congestion, cough and rhinorrhea   Congestion:    Location:  Nasal   Interferes with eating/drinking: no   Cough:    Cough characteristics:  Dry   Severity:  Severe Severity:  Severe Duration:  3 days Associated symptoms: headaches and sneezing    HPI Comments:  Monica Bell is a 53 y.o. female who presents to the Emergency Department complaining of upper respiratory symptoms, onset 3 days ago. Pt first noticed headache, worsening nasal congestion, mild sore throat, and severe dry cough. Pt is a smoker. Pt denies operations or procedures on the lungs. Pt denies asthma. Pt denies rash.   Past Medical History  Diagnosis Date  . Diabetes mellitus   . Thyroid disease    Past Surgical History  Procedure Laterality Date  . Thyroidectomy    . Rotator cuff repair     No family history on file. History  Substance Use Topics  . Smoking status: Current Every Day Smoker    Types: Cigarettes  . Smokeless tobacco: Not on file  . Alcohol Use: No   OB History   Grav Para Term Preterm Abortions TAB SAB Ect Mult Living                 Review of Systems  HENT: Positive for congestion, rhinorrhea and sneezing.   Respiratory: Positive for cough.   Neurological: Positive for headaches.  All other systems reviewed and are negative.     Allergies  Review of patient's allergies indicates no known allergies.  Home Medications   Prior to  Admission medications   Medication Sig Start Date End Date Taking? Authorizing Provider  azithromycin (ZITHROMAX Z-PAK) 250 MG tablet Take two tablets on day one, then one tab qd days 2-5 06/04/13   Tammy L. Triplett, PA-C  azithromycin (ZITHROMAX Z-PAK) 250 MG tablet Take 1 tablet (250 mg total) by mouth daily. Take 2 tabs for first dose, then 1 tab for each additional dose 07/23/13   Hoy Morn, MD  chlorpheniramine-HYDROcodone (TUSSIONEX) 10-8 MG/5ML [facility] Take 5 mLs by mouth 2 (two) times daily. 06/01/13   Historical Provider, MD  insulin aspart (NOVOLOG) 100 UNIT/ML injection Inject 2-35 Units into the skin 4 (four) times daily -  before meals and at bedtime. MD also told her to take a little bit before snacks also    Historical Provider, MD  insulin detemir (LEVEMIR) 100 UNIT/ML injection Inject 10 Units into the skin daily.     Historical Provider, MD  losartan (COZAAR) 50 MG tablet Take 50 mg by mouth daily.    Historical Provider, MD  metFORMIN (GLUCOPHAGE) 1000 MG tablet Take 1,000 mg by mouth 2 (two) times daily with a meal.    Historical Provider, MD  SYNTHROID 150 MCG tablet Take 150 mcg by mouth daily. 05/16/13   Historical Provider, MD   BP 149/73  Pulse 83  Temp(Src) 98.1 F (36.7 C) (  Oral)  Resp 18  Ht 5\' 11"  (1.803 m)  Wt 200 lb (90.719 kg)  BMI 27.91 kg/m2  SpO2 100% Physical Exam  Nursing note and vitals reviewed. Constitutional: She is oriented to person, place, and time. She appears well-developed and well-nourished.  Non-toxic appearance.  HENT:  Head: Normocephalic.  Right Ear: Tympanic membrane and external ear normal.  Left Ear: Tympanic membrane and external ear normal.  Mouth/Throat: Posterior oropharyngeal edema present. No tonsillar abscesses.  Fluid behind left and right TM. Airway is patent. Speech is clear. Nasal congestion present.   Eyes: EOM and lids are normal. Pupils are equal, round, and reactive to light.  Neck: Trachea normal, normal range of motion  and full passive range of motion without pain. Neck supple. Carotid bruit is not present. No tracheal deviation present.  Cardiovascular: Normal rate, regular rhythm, normal heart sounds, intact distal pulses and normal pulses.   Pulmonary/Chest: Breath sounds normal. No respiratory distress.  Coarse breath sounds but no consolidation. Symmetrical rise and fall of the chest. Lungs clear.   Abdominal: Soft. Bowel sounds are normal. There is no tenderness. There is no guarding.  Musculoskeletal: Normal range of motion.  Lymphadenopathy:       Head (right side): No submandibular adenopathy present.       Head (left side): No submandibular adenopathy present.    She has no cervical adenopathy.  Neurological: She is alert and oriented to person, place, and time. She has normal strength. No cranial nerve deficit or sensory deficit.  Skin: Skin is warm and dry. No rash noted.  Psychiatric: She has a normal mood and affect. Her speech is normal.    ED Course  Procedures  DIAGNOSTIC STUDIES: Oxygen Saturation is 100% on RA, normal by my interpretation.  COORDINATION OF CARE: 7:02 PM-Discussed treatment plan which includes cough medicine and Algera-D with pt at bedside and pt agreed to plan.  Labs Review Labs Reviewed - No data to display  Imaging Review No results found.   EKG Interpretation None      MDM  Vital signs are well within normal limits. Pulse oximetry is 100% on room air. Within normal limits by my interpretation. Patient has nasal congestion, sneezing, headache, dry cough has been going on for 3 days.  Plan - patient advised to increase fluids, wash hands frequently, to use a mask until the symptoms have resolved. Prescription for Tussionex and Allegra-D given to the patient. Patient is to followup with her primary physician or return to the emergency department if any changes, problems, or concerns.      **I have reviewed nursing notes, vital signs, and all appropriate  lab and imaging results for this patient.*  **I personally performed the services described in this documentation, which was scribed in my presence. The recorded information has been reviewed and is accurate.Lenox Ahr, PA-C 01/01/14 1920

## 2014-01-01 NOTE — ED Notes (Signed)
Discharge instructions and prescriptions given and reviewed with patient.  Patient verbalized understanding of sedating effects of medication.  Patient ambulatory; discharged home in good condition.

## 2014-01-01 NOTE — Discharge Instructions (Signed)
Upper Respiratory Infection, Adult An upper respiratory infection (URI) is also sometimes known as the common cold. The upper respiratory tract includes the nose, sinuses, throat, trachea, and bronchi. Bronchi are the airways leading to the lungs. Most people improve within 1 week, but symptoms can last up to 2 weeks. A residual cough may last even longer.  CAUSES Many different viruses can infect the tissues lining the upper respiratory tract. The tissues become irritated and inflamed and often become very moist. Mucus production is also common. A cold is contagious. You can easily spread the virus to others by oral contact. This includes kissing, sharing a glass, coughing, or sneezing. Touching your mouth or nose and then touching a surface, which is then touched by another person, can also spread the virus. SYMPTOMS  Symptoms typically develop 1 to 3 days after you come in contact with a cold virus. Symptoms vary from person to person. They may include:  Runny nose.  Sneezing.  Nasal congestion.  Sinus irritation.  Sore throat.  Loss of voice (laryngitis).  Cough.  Fatigue.  Muscle aches.  Loss of appetite.  Headache.  Low-grade fever. DIAGNOSIS  You might diagnose your own cold based on familiar symptoms, since most people get a cold 2 to 3 times a year. Your caregiver can confirm this based on your exam. Most importantly, your caregiver can check that your symptoms are not due to another disease such as strep throat, sinusitis, pneumonia, asthma, or epiglottitis. Blood tests, throat tests, and X-rays are not necessary to diagnose a common cold, but they may sometimes be helpful in excluding other more serious diseases. Your caregiver will decide if any further tests are required. RISKS AND COMPLICATIONS  You may be at risk for a more severe case of the common cold if you smoke cigarettes, have chronic heart disease (such as heart failure) or lung disease (such as asthma), or if  you have a weakened immune system. The very young and very old are also at risk for more serious infections. Bacterial sinusitis, middle ear infections, and bacterial pneumonia can complicate the common cold. The common cold can worsen asthma and chronic obstructive pulmonary disease (COPD). Sometimes, these complications can require emergency medical care and may be life-threatening. PREVENTION  The best way to protect against getting a cold is to practice good hygiene. Avoid oral or hand contact with people with cold symptoms. Wash your hands often if contact occurs. There is no clear evidence that vitamin C, vitamin E, echinacea, or exercise reduces the chance of developing a cold. However, it is always recommended to get plenty of rest and practice good nutrition. TREATMENT  Treatment is directed at relieving symptoms. There is no cure. Antibiotics are not effective, because the infection is caused by a virus, not by bacteria. Treatment may include:  Increased fluid intake. Sports drinks offer valuable electrolytes, sugars, and fluids.  Breathing heated mist or steam (vaporizer or shower).  Eating chicken soup or other clear broths, and maintaining good nutrition.  Getting plenty of rest.  Using gargles or lozenges for comfort.  Controlling fevers with ibuprofen or acetaminophen as directed by your caregiver.  Increasing usage of your inhaler if you have asthma. Zinc gel and zinc lozenges, taken in the first 24 hours of the common cold, can shorten the duration and lessen the severity of symptoms. Pain medicines may help with fever, muscle aches, and throat pain. A variety of non-prescription medicines are available to treat congestion and runny nose. Your caregiver   can make recommendations and may suggest nasal or lung inhalers for other symptoms.  HOME CARE INSTRUCTIONS   Only take over-the-counter or prescription medicines for pain, discomfort, or fever as directed by your  caregiver.  Use a warm mist humidifier or inhale steam from a shower to increase air moisture. This may keep secretions moist and make it easier to breathe.  Drink enough water and fluids to keep your urine clear or pale yellow.  Rest as needed.  Return to work when your temperature has returned to normal or as your caregiver advises. You may need to stay home longer to avoid infecting others. You can also use a face mask and careful hand washing to prevent spread of the virus. SEEK MEDICAL CARE IF:   After the first few days, you feel you are getting worse rather than better.  You need your caregiver's advice about medicines to control symptoms.  You develop chills, worsening shortness of breath, or brown or red sputum. These may be signs of pneumonia.  You develop yellow or brown nasal discharge or pain in the face, especially when you bend forward. These may be signs of sinusitis.  You develop a fever, swollen neck glands, pain with swallowing, or white areas in the back of your throat. These may be signs of strep throat. SEEK IMMEDIATE MEDICAL CARE IF:   You have a fever.  You develop severe or persistent headache, ear pain, sinus pain, or chest pain.  You develop wheezing, a prolonged cough, cough up blood, or have a change in your usual mucus (if you have chronic lung disease).  You develop sore muscles or a stiff neck. Document Released: 10/07/2000 Document Revised: 07/06/2011 Document Reviewed: 07/19/2013 [employer] Patient Information 2015 [employer]. This information is not intended to replace advice given to you by your health care provider. Make sure you discuss any questions you have with your health care provider.  

## 2014-01-01 NOTE — ED Notes (Signed)
Sima Matas, PA in to assess.

## 2014-01-01 NOTE — ED Notes (Signed)
Pt presents to ED c/o sneezing, headache, and dry cough q 3 days. Denies N/V/D.

## 2014-01-01 NOTE — ED Provider Notes (Signed)
Medical screening examination/treatment/procedure(s) were performed by non-physician practitioner and as supervising physician I was immediately available for consultation/collaboration.    Dorie Rank, MD 01/01/14 2239

## 2014-08-16 ENCOUNTER — Ambulatory Visit
Admit: 2014-08-16 | Disposition: A | Discharge status: Home / Self Care | Payer: Self-pay | Attending: Unknown Physician Specialty | Admitting: Unknown Physician Specialty

## 2014-08-20 LAB — SURGICAL PATHOLOGY

## 2014-11-05 ENCOUNTER — Other Ambulatory Visit (HOSPITAL_COMMUNITY): Payer: Self-pay | Admitting: Nurse Practitioner

## 2014-11-05 DIAGNOSIS — Z1231 Encounter for screening mammogram for malignant neoplasm of breast: Secondary | ICD-10-CM

## 2014-11-19 ENCOUNTER — Ambulatory Visit (HOSPITAL_COMMUNITY)
Admission: RE | Admit: 2014-11-19 | Discharge: 2014-11-19 | Disposition: A | Discharge status: Home / Self Care | Payer: BC Managed Care – PPO | Source: Ambulatory Visit | Attending: Nurse Practitioner | Admitting: Nurse Practitioner

## 2014-11-19 DIAGNOSIS — Z1231 Encounter for screening mammogram for malignant neoplasm of breast: Secondary | ICD-10-CM | POA: Insufficient documentation

## 2015-11-12 ENCOUNTER — Other Ambulatory Visit: Payer: Self-pay | Admitting: Internal Medicine

## 2015-11-12 DIAGNOSIS — Z1231 Encounter for screening mammogram for malignant neoplasm of breast: Secondary | ICD-10-CM

## 2015-11-21 ENCOUNTER — Ambulatory Visit (HOSPITAL_COMMUNITY)
Admission: RE | Admit: 2015-11-21 | Discharge: 2015-11-21 | Disposition: A | Discharge status: Home / Self Care | Payer: BC Managed Care – PPO | Source: Ambulatory Visit | Attending: Internal Medicine | Admitting: Internal Medicine

## 2015-11-21 DIAGNOSIS — Z1231 Encounter for screening mammogram for malignant neoplasm of breast: Secondary | ICD-10-CM | POA: Insufficient documentation

## 2016-08-03 ENCOUNTER — Encounter (HOSPITAL_COMMUNITY): Payer: BC Managed Care – PPO | Admitting: Oncology

## 2016-08-14 ENCOUNTER — Ambulatory Visit (HOSPITAL_COMMUNITY): Payer: BC Managed Care – PPO | Admitting: Hematology

## 2017-03-14 ENCOUNTER — Emergency Department (HOSPITAL_COMMUNITY)
Admission: EM | Admit: 2017-03-14 | Discharge: 2017-03-14 | Disposition: A | Discharge status: Home / Self Care | Payer: BC Managed Care – PPO | Attending: Emergency Medicine | Admitting: Emergency Medicine

## 2017-03-14 DIAGNOSIS — Z794 Long term (current) use of insulin: Secondary | ICD-10-CM | POA: Diagnosis not present

## 2017-03-14 DIAGNOSIS — Z79899 Other long term (current) drug therapy: Secondary | ICD-10-CM | POA: Diagnosis not present

## 2017-03-14 DIAGNOSIS — N898 Other specified noninflammatory disorders of vagina: Secondary | ICD-10-CM | POA: Diagnosis present

## 2017-03-14 DIAGNOSIS — E119 Type 2 diabetes mellitus without complications: Secondary | ICD-10-CM | POA: Diagnosis not present

## 2017-03-14 DIAGNOSIS — F17210 Nicotine dependence, cigarettes, uncomplicated: Secondary | ICD-10-CM | POA: Insufficient documentation

## 2017-03-14 LAB — URINALYSIS, ROUTINE W REFLEX MICROSCOPIC
Bilirubin Urine: NEGATIVE
Glucose, UA: NEGATIVE mg/dL
Hgb urine dipstick: NEGATIVE
Ketones, ur: NEGATIVE mg/dL
Leukocytes, UA: NEGATIVE
Nitrite: NEGATIVE
Protein, ur: NEGATIVE mg/dL
Specific Gravity, Urine: 1.02 (ref 1.005–1.030)
pH: 5 (ref 5.0–8.0)

## 2017-03-14 LAB — WET PREP, GENITAL
Clue Cells Wet Prep HPF POC: NONE SEEN
Sperm: NONE SEEN
Trich, Wet Prep: NONE SEEN
Yeast Wet Prep HPF POC: NONE SEEN

## 2017-03-14 MED ORDER — METRONIDAZOLE 500 MG PO TABS: 500.0000 mg | ORAL_TABLET | Freq: Two times a day (BID) | ORAL | 0 refills | Status: DC

## 2017-03-14 NOTE — Discharge Instructions (Signed)
If in a few days symptoms have not resolved take the metronidazole.

## 2017-03-14 NOTE — ED Notes (Signed)
Pelvic Set up  At bedside

## 2017-03-14 NOTE — ED Triage Notes (Signed)
Pt reports vaginal odor and discharge since yesterday. Pt also reports mild lower abd pain. Denies any high risk sexual activity.

## 2017-03-14 NOTE — ED Provider Notes (Signed)
[facility] EMERGENCY DEPARTMENT Provider Note   CSN: [id number] Arrival date & time: 03/14/17  1638     History   Chief Complaint Chief Complaint  Patient presents with  . Vaginal Discharge    HPI Monica Bell is a 56 y.o. female.  HPI Patient presents with vaginal discharge since yesterday.  States discharge is a little more heavy than normal.  Also states that she is having a fishy smell.  Has had unprotected sex but denies risk for STD.  Slight lower abdominal pain.  No dysuria.  No fevers or chills.  States it is like when she has had bacterial vaginosis in the past.  States her primary care doctor is out on maternity leave so she could not go see them. Past Medical History:  Diagnosis Date  . Diabetes mellitus   . Thyroid disease     There are no active problems to display for this patient.   Past Surgical History:  Procedure Laterality Date  . ROTATOR CUFF REPAIR    . THYROIDECTOMY      OB History    No data available       Home Medications    Prior to Admission medications   Medication Sig Start Date End Date Taking? Authorizing Provider  azithromycin (ZITHROMAX Z-PAK) 250 MG tablet Take two tablets on day one, then one tab qd days 2-5 06/04/13   Triplett, Tammy, PA-C  azithromycin (ZITHROMAX Z-PAK) 250 MG tablet Take 1 tablet (250 mg total) by mouth daily. Take 2 tabs for first dose, then 1 tab for each additional dose 07/23/13   Jola Schmidt, MD  chlorpheniramine-HYDROcodone (TUSSIONEX) 10-8 MG/5ML LQCR Take 5 mLs by mouth 2 (two) times daily. 06/01/13   [provider]  chlorpheniramine-HYDROcodone (TUSSIONEX) 10-8 MG/5ML LQCR Take 5 mLs by mouth every 12 (twelve) hours as needed for cough. 01/01/14   Lily Kocher, PA-C  fexofenadine-pseudoephedrine (ALLEGRA-D) 60-120 MG per tablet Take 1 tablet by mouth 2 (two) times daily. 01/01/14   Lily Kocher, PA-C  insulin aspart (NOVOLOG) 100 UNIT/ML injection Inject 2-35 Units into the skin 4 (four)  times daily -  before meals and at bedtime. MD also told her to take a little bit before snacks also    [provider]  insulin detemir (LEVEMIR) 100 UNIT/ML injection Inject 10 Units into the skin daily.     [provider]  losartan (COZAAR) 50 MG tablet Take 50 mg by mouth daily.    [provider]  metFORMIN (GLUCOPHAGE) 1000 MG tablet Take 1,000 mg by mouth 2 (two) times daily with a meal.    [provider]  metroNIDAZOLE (FLAGYL) 500 MG tablet Take 1 tablet (500 mg total) 2 (two) times daily by mouth. 03/14/17   Davonna Belling, MD  SYNTHROID 150 MCG tablet Take 150 mcg by mouth daily. 05/16/13   [provider]    Family History No family history on file.  Social History Social History   Tobacco Use  . Smoking status: Current Every Day Smoker    Types: Cigarettes  Substance Use Topics  . Alcohol use: No  . Drug use: No     Allergies   Patient has no known allergies.   Review of Systems Review of Systems  Constitutional: Negative for appetite change.  HENT: Negative for congestion.   Respiratory: Negative for shortness of breath.   Cardiovascular: Negative for chest pain.  Gastrointestinal: Positive for abdominal pain.  Genitourinary: Positive for vaginal discharge. Negative for  decreased urine volume, dysuria, flank pain, menstrual problem, urgency and vaginal pain.  Musculoskeletal: Negative for back pain.  Neurological: Negative for weakness.  Psychiatric/Behavioral: Negative for confusion.     Physical Exam Updated Vital Signs BP (!) 161/76 (BP Location: Right Arm)   Pulse 81   Temp 98.2 F (36.8 C) (Oral)   Resp 18   Ht 5\' 11"  (1.803 m)   Wt 92.5 kg (204 lb)   SpO2 98%   BMI 28.45 kg/m   Physical Exam  Constitutional: She appears well-developed.  HENT:  Head: Atraumatic.  Eyes: Pupils are equal, round, and reactive to light.  Neck: Neck supple.  Cardiovascular: Normal rate.  Pulmonary/Chest: Effort  normal.  Abdominal: There is no tenderness.  Genitourinary:  Genitourinary Comments: No cervical motion tenderness.  Small amount of white cervical discharge.  No adnexal tenderness.  No rash.  Musculoskeletal: Normal range of motion.  Neurological: She is alert.  Skin: Skin is warm. Capillary refill takes less than 2 seconds.     ED Treatments / Results  Labs (all labs ordered are listed, but only abnormal results are displayed) Labs Reviewed  WET PREP, GENITAL - Abnormal; Notable for the following components:      Result Value   WBC, Wet Prep HPF POC FEW (*)    All other components within normal limits  URINALYSIS, ROUTINE W REFLEX MICROSCOPIC  RPR  HIV ANTIBODY (ROUTINE TESTING)  GC/CHLAMYDIA PROBE AMP ([place]) NOT AT [facility]    EKG  EKG Interpretation None       Radiology No results found.  Procedures Procedures (including critical care time)  Medications Ordered in ED Medications - No data to display   Initial Impression / Assessment and Plan / ED Course  I have reviewed the triage vital signs and the nursing notes.  Pertinent labs & imaging results that were available during my care of the patient were reviewed by me and considered in my medical decision making (see chart for details).     Patient with vaginal discharge and some mild lower abdominal pain.  Denies high risk sexual behavior but has had unprotected sex.  Rather benign exam.  Wet prep showed few white cells but otherwise negative.  Will discharge patient home.  Other culture sent but pending.  Patient will monitor to see if it improves on its own.  If it does not was given prescription for Flagyl to treat vaginitis.  Final Clinical Impressions(s) / ED Diagnoses   Final diagnoses:  Vaginal discharge    ED Discharge Orders        Ordered    metroNIDAZOLE (FLAGYL) 500 MG tablet  2 times daily     03/14/17 1818       Davonna Belling, MD 03/14/17 1825

## 2017-03-15 LAB — HIV ANTIBODY (ROUTINE TESTING W REFLEX): HIV Screen 4th Generation wRfx: NONREACTIVE

## 2017-03-15 LAB — GC/CHLAMYDIA PROBE AMP (~~LOC~~) NOT AT ARMC
Chlamydia: NEGATIVE
Neisseria Gonorrhea: NEGATIVE

## 2017-03-15 LAB — RPR: RPR Ser Ql: NONREACTIVE

## 2017-06-17 ENCOUNTER — Other Ambulatory Visit (HOSPITAL_COMMUNITY): Payer: Self-pay | Admitting: Nurse Practitioner

## 2017-06-17 DIAGNOSIS — R1011 Right upper quadrant pain: Secondary | ICD-10-CM

## 2017-06-22 ENCOUNTER — Ambulatory Visit (HOSPITAL_COMMUNITY): Payer: BC Managed Care – PPO

## 2017-07-14 ENCOUNTER — Ambulatory Visit (HOSPITAL_COMMUNITY): Admission: RE | Admit: 2017-07-14 | Payer: BC Managed Care – PPO | Source: Ambulatory Visit

## 2018-10-03 ENCOUNTER — Ambulatory Visit (INDEPENDENT_AMBULATORY_CARE_PROVIDER_SITE_OTHER): Payer: BC Managed Care – PPO

## 2018-10-03 ENCOUNTER — Other Ambulatory Visit: Payer: Self-pay

## 2018-10-03 ENCOUNTER — Encounter: Payer: Self-pay | Admitting: Orthopedic Surgery

## 2018-10-03 ENCOUNTER — Ambulatory Visit: Payer: BC Managed Care – PPO | Admitting: Orthopedic Surgery

## 2018-10-03 VITALS — BP 141/71 | HR 79 | Temp 98.4°F | Ht 71.0 in | Wt 197.0 lb

## 2018-10-03 DIAGNOSIS — G8929 Other chronic pain: Secondary | ICD-10-CM

## 2018-10-03 DIAGNOSIS — M25511 Pain in right shoulder: Secondary | ICD-10-CM

## 2018-10-03 DIAGNOSIS — M7501 Adhesive capsulitis of right shoulder: Secondary | ICD-10-CM

## 2018-10-03 NOTE — Progress Notes (Signed)
NEW PROBLEM OFFICE VISIT  Chief Complaint  Patient presents with  . Shoulder Pain    Right shoulder with history of RCR on same side.    58 YO FEMALE S/P OPEN RCR RT SHOULDER IN 2000 IN [place] [facility] PRESENT FOR EVAL OF RT SHOULDER PAIN AND STIFFNESS WITH LOSS OF MOTION for 3 months  Most of the pain is in the shoulder upper arm and somewhat over the supraspinatus fossa     Review of Systems  HENT: Positive for tinnitus.   Gastrointestinal: Positive for heartburn.  Musculoskeletal: Positive for joint pain and myalgias.  All other systems reviewed and are negative.    Past Medical History:  Diagnosis Date  . Diabetes mellitus   . Thyroid disease     Past Surgical History:  Procedure Laterality Date  . ROTATOR CUFF REPAIR    . THYROIDECTOMY      Family History  Problem Relation Age of Onset  . Diabetes Mother   . Cancer Mother   . COPD Father   . Hypertension Father    Social History   Tobacco Use  . Smoking status: Current Every Day Smoker    Types: Cigarettes  . Smokeless tobacco: Never Used  Substance Use Topics  . Alcohol use: No  . Drug use: No    No Known Allergies  Current Meds  Medication Sig  . Insulin Degludec (TRESIBA FLEXTOUCH [place]) Inject into the skin.  Marland Kitchen losartan (COZAAR) 50 MG tablet Take 50 mg by mouth daily.  . metFORMIN (GLUCOPHAGE) 1000 MG tablet Take 1,000 mg by mouth 2 (two) times daily with a meal.  . Semaglutide,0.25 or 0.5MG /DOS, (OZEMPIC, 0.25 OR 0.5 MG/DOSE,) 2 MG/1.5ML SOPN   . SYNTHROID 150 MCG tablet Take 150 mcg by mouth daily.  . [DISCONTINUED] insulin detemir (LEVEMIR) 100 UNIT/ML injection Inject 10 Units into the skin daily.     BP (!) 141/71   Pulse 79   Temp 98.4 F (36.9 C)   Ht 5\' 11"  (1.803 m)   Wt 197 lb (89.4 kg)   BMI 27.48 kg/m   Physical Exam Vitals signs and nursing note reviewed.  Constitutional:      Appearance: Normal appearance.  Neurological:     Mental Status: She is alert and oriented to  person, place, and time.  Psychiatric:        Mood and Affect: Mood normal.     Ortho Exam  Left shoulder: No tenderness in the left shoulder to palpation.  Full range of motion is exhibited with normal strength in the rotator cuff.  She is stable in abduction external rotation her skin is intact there are no scars or lesions.  She is neurovascularly normal and there are no lymph nodes palpable  Right shoulder there is an anterior scar over the acromioclavicular joint extends distally approximately 7 cm.  It healed and is nontender there is no Tinel's over the shoulder she does have tenderness in the right upper arm over the deltoid and humerus but not over the biceps tendon.  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  . Chronic right shoulder pain   . Adhesive capsulitis of right shoulder Yes    PLAN: (Rx., injectx, surgery, frx, mri/ct) She appears to have elements of adhesive capsulitis  I injected the subacromial space and recommended physical therapy with a 58-month follow-up No orders of the defined types were placed in this encounter.  Procedure note the subacromial injection shoulder RIGHT    Verbal consent was obtained to inject the  RIGHT   Shoulder  Timeout  was completed to confirm the injection site is a subacromial space of the  RIGHT  shoulder   Medication used Depo-Medrol 40 mg and lidocaine 1% 3 cc  Anesthesia was provided by ethyl chloride  The injection was performed in the RIGHT  posterior subacromial space. After pinning the skin with alcohol and anesthetized the skin with ethyl chloride the subacromial space was injected using a 20-gauge needle. There were no complications  Sterile dressing was applied.    Arther Abbott, MD  10/03/2018 4:07 PM

## 2018-11-02 ENCOUNTER — Other Ambulatory Visit (HOSPITAL_COMMUNITY): Payer: Self-pay | Admitting: Student

## 2018-11-02 DIAGNOSIS — Z1231 Encounter for screening mammogram for malignant neoplasm of breast: Secondary | ICD-10-CM

## 2018-11-07 ENCOUNTER — Ambulatory Visit (HOSPITAL_COMMUNITY)
Admission: RE | Admit: 2018-11-07 | Discharge: 2018-11-07 | Disposition: A | Discharge status: Home / Self Care | Payer: BC Managed Care – PPO | Source: Ambulatory Visit | Attending: Student | Admitting: Student

## 2018-11-07 ENCOUNTER — Other Ambulatory Visit: Payer: Self-pay

## 2018-11-07 DIAGNOSIS — Z1231 Encounter for screening mammogram for malignant neoplasm of breast: Secondary | ICD-10-CM | POA: Diagnosis not present

## 2018-12-14 DIAGNOSIS — E782 Mixed hyperlipidemia: Secondary | ICD-10-CM

## 2018-12-14 HISTORY — DX: Mixed hyperlipidemia: E78.2

## 2018-12-30 ENCOUNTER — Emergency Department (HOSPITAL_COMMUNITY)
Admission: EM | Admit: 2018-12-30 | Discharge: 2018-12-30 | Disposition: A | Discharge status: Home / Self Care | Payer: BC Managed Care – PPO | Attending: Emergency Medicine | Admitting: Emergency Medicine

## 2018-12-30 ENCOUNTER — Emergency Department (HOSPITAL_COMMUNITY): Payer: BC Managed Care – PPO

## 2018-12-30 ENCOUNTER — Encounter (HOSPITAL_COMMUNITY): Payer: Self-pay

## 2018-12-30 ENCOUNTER — Other Ambulatory Visit: Payer: Self-pay

## 2018-12-30 DIAGNOSIS — Z7984 Long term (current) use of oral hypoglycemic drugs: Secondary | ICD-10-CM | POA: Diagnosis not present

## 2018-12-30 DIAGNOSIS — G8929 Other chronic pain: Secondary | ICD-10-CM | POA: Diagnosis not present

## 2018-12-30 DIAGNOSIS — Z79899 Other long term (current) drug therapy: Secondary | ICD-10-CM | POA: Insufficient documentation

## 2018-12-30 DIAGNOSIS — F17210 Nicotine dependence, cigarettes, uncomplicated: Secondary | ICD-10-CM | POA: Insufficient documentation

## 2018-12-30 DIAGNOSIS — M25561 Pain in right knee: Secondary | ICD-10-CM | POA: Diagnosis not present

## 2018-12-30 DIAGNOSIS — E119 Type 2 diabetes mellitus without complications: Secondary | ICD-10-CM | POA: Diagnosis not present

## 2018-12-30 MED ORDER — MELOXICAM 15 MG PO TABS: 15.0000 mg | ORAL_TABLET | Freq: Every day | ORAL | 0 refills | Status: AC

## 2018-12-30 NOTE — ED Provider Notes (Signed)
[name]   CSN: [id number] Arrival date & time: 12/30/18  1520     History   Chief Complaint Chief Complaint  Patient presents with  . Knee Pain    HPI Monica Bell is a 58 y.o. female.     The history is provided by the patient. No language interpreter was used.  Knee Pain Location:  Knee Injury: no   Knee location:  R knee Pain details:    Quality:  Aching   Radiates to:  Does not radiate   Severity:  Moderate   Timing:  Constant   Progression:  Worsening Chronicity:  New Dislocation: no   Relieved by:  None tried Worsened by:  Nothing Ineffective treatments:  None tried Associated symptoms: no back pain   Risk factors: no concern for non-accidental trauma   Pt reports she gets injections for her knee from her primary care.  Pt reports her appointment keep getting counseled.  Pt has had multiple injections   Past Medical History:  Diagnosis Date  . Diabetes mellitus   . Thyroid disease     There are no active problems to display for this patient.   Past Surgical History:  Procedure Laterality Date  . ROTATOR CUFF REPAIR    . THYROIDECTOMY       OB History   No obstetric history on file.      Home Medications    Prior to Admission medications   Medication Sig Start Date End Date Taking? Authorizing Provider  Insulin Degludec (TRESIBA FLEXTOUCH [place]) Inject into the skin.    [provider]  losartan (COZAAR) 50 MG tablet Take 50 mg by mouth daily.    [provider]  metFORMIN (GLUCOPHAGE) 1000 MG tablet Take 1,000 mg by mouth 2 (two) times daily with a meal.    [provider]  Semaglutide,0.25 or 0.5MG /DOS, (OZEMPIC, 0.25 OR 0.5 MG/DOSE,) 2 MG/1.5ML SOPN  02/01/18   [provider]  SYNTHROID 150 MCG tablet Take 150 mcg by mouth daily. 05/16/13   [provider]    Family History Family History  Problem Relation Age of Onset  . Diabetes Mother   . Cancer  Mother   . COPD Father   . Hypertension Father     Social History Social History   Tobacco Use  . Smoking status: Current Every Day Smoker    Types: Cigarettes  . Smokeless tobacco: Never Used  Substance Use Topics  . Alcohol use: No  . Drug use: No     Allergies   Patient has no known allergies.   Review of Systems Review of Systems  Musculoskeletal: Positive for arthralgias, joint swelling and myalgias. Negative for back pain.  All other systems reviewed and are negative.    Physical Exam Updated Vital Signs BP (!) 173/77 (BP Location: Right Arm)   Pulse 81   Temp 98.8 F (37.1 C) (Oral)   Resp 18   Ht 5\' 11"  (1.803 m)   Wt 89.4 kg   SpO2 100%   BMI 27.48 kg/m   Physical Exam Vitals signs and nursing note reviewed.  Constitutional:      Appearance: She is well-developed.  HENT:     Head: Normocephalic.  Neck:     Musculoskeletal: Normal range of motion.  Cardiovascular:     Rate and Rhythm: Normal rate.  Pulmonary:     Effort: Pulmonary effort is normal.  Abdominal:     General: There is no  distension.  Musculoskeletal: Normal range of motion.     Comments: Swollen right knee,  From,  Pain with movement,  nv and ns intact   Skin:    General: Skin is warm.  Neurological:     Mental Status: She is alert and oriented to person, place, and time.  Psychiatric:        Mood and Affect: Mood normal.      ED Treatments / Results  Labs (all labs ordered are listed, but only abnormal results are displayed) Labs Reviewed - No data to display  EKG None  Radiology Dg Knee Complete 4 Views Right  Result Date: 12/30/2018 CLINICAL DATA:  Right knee pain.  No known injury. EXAM: RIGHT KNEE - COMPLETE 4+ VIEW COMPARISON:  None. FINDINGS: No evidence of fracture or malalignment. Mild early tricompartmental osteophytosis with relative preservation of the joint spaces. No knee joint effusion. Scattered vascular calcifications. IMPRESSION: Mild early  degenerative changes of the right knee. No acute findings. Electronically Signed   By: Davina Poke M.D.   On: 12/30/2018 16:25    Procedures Procedures (including critical care time)  Medications Ordered in ED Medications - No data to display   Initial Impression / Assessment and Plan / ED Course  I have reviewed the triage vital signs and the nursing notes.  Pertinent labs & imaging results that were available during my care of the patient were reviewed by me and considered in my medical decision making (see chart for details).        Pt states her primary has canceled her appointment multiple times.  Pt sees Dr. Aline Brochure for her shoulder.  Pt has an appointment to see Dr. Aline Brochure on the 9th.  I will try pt on meloxicam.  This may help with symptoms.   Final Clinical Impressions(s) / ED Diagnoses   Final diagnoses:  Chronic pain of right knee    ED Discharge Orders         Ordered    meloxicam (MOBIC) 15 MG tablet  Daily     12/30/18 1736        An After Visit Summary was printed and given to the patient.    Fransico Meadow, Hershal Coria 12/30/18 Alex, Nathan, MD 12/30/18 2241

## 2018-12-30 NOTE — Discharge Instructions (Signed)
See Dr. Aline Brochure for evaluation  of knee problems

## 2018-12-30 NOTE — ED Triage Notes (Signed)
Pt presents to ED with complaints of right knee pain x couple of weeks.

## 2019-01-04 ENCOUNTER — Other Ambulatory Visit: Payer: Self-pay

## 2019-01-04 ENCOUNTER — Ambulatory Visit: Payer: BC Managed Care – PPO | Admitting: Orthopedic Surgery

## 2019-01-04 ENCOUNTER — Encounter: Payer: Self-pay | Admitting: Orthopedic Surgery

## 2019-01-04 VITALS — BP 144/83 | HR 81 | Temp 97.8°F | Ht 71.0 in | Wt 187.0 lb

## 2019-01-04 DIAGNOSIS — M7501 Adhesive capsulitis of right shoulder: Secondary | ICD-10-CM

## 2019-01-04 DIAGNOSIS — M25511 Pain in right shoulder: Secondary | ICD-10-CM | POA: Diagnosis not present

## 2019-01-04 DIAGNOSIS — G8929 Other chronic pain: Secondary | ICD-10-CM

## 2019-01-04 NOTE — Progress Notes (Signed)
Chief Complaint  Patient presents with  . Follow-up    3 week recheck on right shoulder.    58 year old female 3 months recheck right shoulder adhesive capsulitis status post previous rotator cuff repair back in late 1990s  She has stopped physical therapy to expensive  Still has pain especially at night.  She is somewhat compliant with the exercises  Review of systems no numbness or tingling in the right arm no neck pain at this time  BP (!) 144/83   Pulse 81   Temp 97.8 F (36.6 C)   Ht 5\' 11"  (1.803 m)   Wt 187 lb (84.8 kg)   BMI 26.08 kg/m   She is well-developed well-nourished grooming and hygiene are normal alert and oriented x3 mood affect normal gait and station normal  Right shoulder with the arm at the side on the right we have 30 degrees external rotation versus 50 on the left passive forward elevation 110 on the right shoulder is stable cuff strength seems normal previous skin incision looks good no erythema no tenderness pulse and temperature normal right arm negative for lymph node enlargement  Encounter Diagnoses  Name Primary?  . Chronic right shoulder pain Yes  . Adhesive capsulitis of right shoulder      Procedure note the subacromial injection shoulder RIGHT    Verbal consent was obtained to inject the  RIGHT   Shoulder  Timeout was completed to confirm the injection site is a subacromial space of the  RIGHT  shoulder   Medication used Depo-Medrol 40 mg and lidocaine 1% 3 cc  Anesthesia was provided by ethyl chloride  The injection was performed in the RIGHT  posterior subacromial space. After pinning the skin with alcohol and anesthetized the skin with ethyl chloride the subacromial space was injected using a 20-gauge needle. There were no complications  Sterile dressing was applied.   Recommend home physical therapy as the formal therapy is too expensive.  We discussed that this will take longer to recover doing her self  We repeated her  injection follow-up in 3 months

## 2019-01-04 NOTE — Patient Instructions (Signed)
DO HOME EXERCISES 2 X A DAY

## 2019-01-06 DIAGNOSIS — E559 Vitamin D deficiency, unspecified: Secondary | ICD-10-CM

## 2019-01-06 DIAGNOSIS — E119 Type 2 diabetes mellitus without complications: Secondary | ICD-10-CM

## 2019-01-06 DIAGNOSIS — M1990 Unspecified osteoarthritis, unspecified site: Secondary | ICD-10-CM

## 2019-01-06 HISTORY — DX: Type 2 diabetes mellitus without complications: E11.9

## 2019-01-06 HISTORY — DX: Unspecified osteoarthritis, unspecified site: M19.90

## 2019-01-06 HISTORY — DX: Vitamin D deficiency, unspecified: E55.9

## 2019-04-05 ENCOUNTER — Ambulatory Visit: Payer: BC Managed Care – PPO | Admitting: Orthopedic Surgery

## 2019-04-26 ENCOUNTER — Ambulatory Visit: Payer: BC Managed Care – PPO | Admitting: Orthopedic Surgery

## 2019-05-17 ENCOUNTER — Encounter: Payer: Self-pay | Admitting: Orthopedic Surgery

## 2019-05-17 ENCOUNTER — Ambulatory Visit: Payer: BC Managed Care – PPO | Admitting: Orthopedic Surgery

## 2019-05-17 ENCOUNTER — Other Ambulatory Visit: Payer: Self-pay

## 2019-05-17 ENCOUNTER — Ambulatory Visit: Payer: BC Managed Care – PPO

## 2019-05-17 VITALS — BP 173/74 | HR 84 | Ht 71.0 in | Wt 190.0 lb

## 2019-05-17 DIAGNOSIS — G8929 Other chronic pain: Secondary | ICD-10-CM | POA: Diagnosis not present

## 2019-05-17 DIAGNOSIS — E119 Type 2 diabetes mellitus without complications: Secondary | ICD-10-CM | POA: Insufficient documentation

## 2019-05-17 DIAGNOSIS — R928 Other abnormal and inconclusive findings on diagnostic imaging of breast: Secondary | ICD-10-CM | POA: Insufficient documentation

## 2019-05-17 DIAGNOSIS — M25512 Pain in left shoulder: Secondary | ICD-10-CM | POA: Diagnosis not present

## 2019-05-17 DIAGNOSIS — E785 Hyperlipidemia, unspecified: Secondary | ICD-10-CM | POA: Insufficient documentation

## 2019-05-17 DIAGNOSIS — R011 Cardiac murmur, unspecified: Secondary | ICD-10-CM | POA: Insufficient documentation

## 2019-05-17 DIAGNOSIS — E782 Mixed hyperlipidemia: Secondary | ICD-10-CM | POA: Insufficient documentation

## 2019-05-17 DIAGNOSIS — M7501 Adhesive capsulitis of right shoulder: Secondary | ICD-10-CM

## 2019-05-17 DIAGNOSIS — K219 Gastro-esophageal reflux disease without esophagitis: Secondary | ICD-10-CM | POA: Insufficient documentation

## 2019-05-17 NOTE — Patient Instructions (Signed)
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 15% of patients will have increased pain within the 24 hours postinjection.   This is transient and will go away.   We recommend that you use ice packs on the injection site for 20 minutes every 2 hours and extra strength Tylenol 2 tablets every 8 as needed until the pain resolves.  If you continue to have pain after taking the Tylenol and using the ice please call the office for further instructions.

## 2019-05-17 NOTE — Progress Notes (Signed)
Chief Complaint  Patient presents with  . Shoulder Pain    right wants injection/ also requesting injection left shoulder pain for a month     Problem #1 right shoulder adhesive capsulitis  Problem #2 1 month history of left shoulder pain  She is 59 years old she had a rotator cuff repair of the right shoulder years ago started having some shoulder pain and stiffness was diagnosed with adhesive capsulitis injection was given in physical therapy was started  Comes in today with a 1 month history of atraumatic onset of left shoulder pain and is considering treatment with injection.  Past Medical History:  Diagnosis Date  . Diabetes mellitus   . Thyroid disease     System review no skin rashes warmth to the joint or signs of infection no fever  BP (!) 173/74   Pulse 84   Ht 5\' 11"  (1.803 m)   Wt 190 lb (86.2 kg)   BMI 26.50 kg/m   Right shoulder shoulder external rotation 40 degrees flexion in the scapular plane 130 degrees, no tenderness no instability normal strength and muscle tone skin normal neurovascular exam intact   Left shoulder normal range of motion tenderness is anteriorly and runs down the front of the arm, skin normal neurovascular exam intact, strength normal no instability  X-ray x-ray shows chronic cuff disease see report  Encounter Diagnoses  Name Primary?  . Chronic left shoulder pain Yes  . Adhesive capsulitis of right shoulder     Left shoulder acute uncomplicated illness most likely tendinitis treated with injection  Adhesive capsulitis right shoulder stable chronic problem  Also treated with injection  Procedure injection right shoulder subacromial joint and left shoulder subacromial joint   procedure note the subacromial injection shoulder RIGHT  Verbal consent was obtained to inject the  RIGHT   Shoulder  Timeout was completed to confirm the injection site is a subacromial space of the  RIGHT  shoulder   Medication used Depo-Medrol 40 mg  and lidocaine 1% 3 cc  Anesthesia was provided by ethyl chloride  The injection was performed in the RIGHT  posterior subacromial space. After pinning the skin with alcohol and anesthetized the skin with ethyl chloride the subacromial space was injected using a 20-gauge needle. There were no complications  Sterile dressing was applied.    Procedure note the subacromial injection shoulder left   Verbal consent was obtained to inject the  Left   Shoulder  Timeout was completed to confirm the injection site is a subacromial space of the  left  shoulder  Medication used Depo-Medrol 40 mg and lidocaine 1% 3 cc  Anesthesia was provided by ethyl chloride  The injection was performed in the left  posterior subacromial space. After pinning the skin with alcohol and anesthetized the skin with ethyl chloride the subacromial space was injected using a 20-gauge needle. There were no complications  Sterile dressing was applied.   Follow-up as needed

## 2019-08-01 DIAGNOSIS — J301 Allergic rhinitis due to pollen: Secondary | ICD-10-CM

## 2019-08-01 DIAGNOSIS — Z6828 Body mass index (BMI) 28.0-28.9, adult: Secondary | ICD-10-CM

## 2019-08-01 HISTORY — DX: Body mass index (BMI) 28.0-28.9, adult: Z68.28

## 2019-08-01 HISTORY — DX: Allergic rhinitis due to pollen: J30.1

## 2019-08-23 ENCOUNTER — Encounter: Payer: Self-pay | Admitting: Orthopedic Surgery

## 2019-08-23 ENCOUNTER — Other Ambulatory Visit: Payer: Self-pay

## 2019-08-23 ENCOUNTER — Ambulatory Visit (INDEPENDENT_AMBULATORY_CARE_PROVIDER_SITE_OTHER): Payer: BC Managed Care – PPO | Admitting: Orthopedic Surgery

## 2019-08-23 VITALS — Temp 97.2°F | Ht 71.0 in | Wt 189.0 lb

## 2019-08-23 DIAGNOSIS — M25512 Pain in left shoulder: Secondary | ICD-10-CM | POA: Diagnosis not present

## 2019-08-23 DIAGNOSIS — G8929 Other chronic pain: Secondary | ICD-10-CM | POA: Diagnosis not present

## 2019-08-23 DIAGNOSIS — M7501 Adhesive capsulitis of right shoulder: Secondary | ICD-10-CM

## 2019-08-23 NOTE — Progress Notes (Signed)
Chief Complaint  Patient presents with  . Shoulder Pain    injecitons today     Chief Complaint  Patient presents with  . Shoulder Pain    right wants injection/ also requesting injection left shoulder pain for a month     Problem #1 right shoulder adhesive capsulitis  Problem #2 1 month history of left shoulder pain  She is 59 years old she had a rotator cuff repair of the right shoulder years ago started having some shoulder pain and stiffness was diagnosed with adhesive capsulitis injection was given in physical therapy was started  Comes in today with a 1 month history of atraumatic onset of left shoulder pain and is considering treatment with injection.  52 DM hypothyroidism could not afford home therapy for adhesive capsulitis she is doing her own program with [facility] request 2 injections which were given she was advised to continue exercise and do the best she can and see [place] in 3 months  Encounter Diagnoses  Name Primary?  . Chronic left shoulder pain Yes  . Adhesive capsulitis of right shoulder

## 2019-08-23 NOTE — Patient Instructions (Signed)
Do the best that she can with your exercises  You have received an injection of steroids into the joint. 15% of patients will have increased pain within the 24 hours postinjection.   This is transient and will go away.   We recommend that you use ice packs on the injection site for 20 minutes every 2 hours and extra strength Tylenol 2 tablets every 8 as needed until the pain resolves.  If you continue to have pain after taking the Tylenol and using the ice please call the office for further instructions.

## 2019-11-08 ENCOUNTER — Other Ambulatory Visit (HOSPITAL_COMMUNITY): Payer: Self-pay | Admitting: Nurse Practitioner

## 2019-11-08 DIAGNOSIS — Z1231 Encounter for screening mammogram for malignant neoplasm of breast: Secondary | ICD-10-CM

## 2019-11-13 ENCOUNTER — Ambulatory Visit (HOSPITAL_COMMUNITY): Payer: BC Managed Care – PPO

## 2019-11-20 ENCOUNTER — Ambulatory Visit: Payer: BC Managed Care – PPO | Admitting: Orthopedic Surgery

## 2019-11-21 ENCOUNTER — Ambulatory Visit: Payer: BC Managed Care – PPO | Admitting: Orthopedic Surgery

## 2019-11-21 ENCOUNTER — Other Ambulatory Visit: Payer: Self-pay

## 2019-11-21 ENCOUNTER — Encounter: Payer: Self-pay | Admitting: Orthopedic Surgery

## 2019-11-21 VITALS — Ht 71.0 in | Wt 190.0 lb

## 2019-11-21 DIAGNOSIS — M25512 Pain in left shoulder: Secondary | ICD-10-CM | POA: Diagnosis not present

## 2019-11-21 DIAGNOSIS — E119 Type 2 diabetes mellitus without complications: Secondary | ICD-10-CM | POA: Diagnosis not present

## 2019-11-21 DIAGNOSIS — M25511 Pain in right shoulder: Secondary | ICD-10-CM

## 2019-11-21 DIAGNOSIS — G8929 Other chronic pain: Secondary | ICD-10-CM

## 2019-11-21 DIAGNOSIS — M7501 Adhesive capsulitis of right shoulder: Secondary | ICD-10-CM

## 2019-11-21 DIAGNOSIS — Z794 Long term (current) use of insulin: Secondary | ICD-10-CM

## 2019-11-21 NOTE — Patient Instructions (Signed)
You can call for injections anytime after 3 months   You have received an injection of steroids into the joint. 15% of patients will have increased pain within the 24 hours postinjection.   This is transient and will go away.   We recommend that you use ice packs on the injection site for 20 minutes every 2 hours and extra strength Tylenol 2 tablets every 8 as needed until the pain resolves.  If you continue to have pain after taking the Tylenol and using the ice please call the office for further instructions.

## 2019-11-21 NOTE — Progress Notes (Signed)
Chief Complaint  Patient presents with  . Follow-up    Recheck on bilateral shoulder pain.   Encounter Diagnoses  Name Primary?  . Diabetes mellitus type 2, insulin dependent ([place]) Yes  . Adhesive capsulitis of right shoulder   . Chronic right shoulder pain     She says that the pain is intermittent and the shots seem to help her significantly    Current Outpatient Medications:  .  Insulin Degludec (TRESIBA FLEXTOUCH [place]), Inject into the skin., Disp: , Rfl:  .  levothyroxine (SYNTHROID) 137 MCG tablet, Take 137 mcg by mouth daily., Disp: , Rfl:  .  losartan (COZAAR) 50 MG tablet, Take 50 mg by mouth daily., Disp: , Rfl:  .  meloxicam (MOBIC) 15 MG tablet, Take 1 tablet (15 mg total) by mouth daily., Disp: 30 tablet, Rfl: 0 .  metFORMIN (GLUCOPHAGE) 1000 MG tablet, Take 1,000 mg by mouth 2 (two) times daily with a meal., Disp: , Rfl:  .  montelukast (SINGULAIR) 10 MG tablet, Take 10 mg by mouth daily., Disp: , Rfl:  .  Semaglutide,0.25 or 0.5MG /DOS, (OZEMPIC, 0.25 OR 0.5 MG/DOSE,) 2 MG/1.5ML SOPN, , Disp: , Rfl:  .  SYNTHROID 150 MCG tablet, Take 150 mcg by mouth daily., Disp: , Rfl:   Procedure injection right shoulder subacromial joint and left shoulder subacromial joint   procedure note the subacromial injection shoulder RIGHT  Verbal consent was obtained to inject the  RIGHT   Shoulder  Timeout was completed to confirm the injection site is a subacromial space of the  RIGHT  shoulder   Medication used Depo-Medrol 40 mg and lidocaine 1% 3 cc  Anesthesia was provided by ethyl chloride  The injection was performed in the RIGHT  posterior subacromial space. After pinning the skin with alcohol and anesthetized the skin with ethyl chloride the subacromial space was injected using a 20-gauge needle. There were no complications  Sterile dressing was applied.    Procedure note the subacromial injection shoulder left   Verbal consent was obtained to inject the  Left    Shoulder  Timeout was completed to confirm the injection site is a subacromial space of the  left  shoulder  Medication used Depo-Medrol 40 mg and lidocaine 1% 3 cc  Anesthesia was provided by ethyl chloride  The injection was performed in the left  posterior subacromial space. After pinning the skin with alcohol and anesthetized the skin with ethyl chloride the subacromial space was injected using a 20-gauge needle. There were no complications  Sterile dressing was applied.   F/u prn

## 2019-11-27 ENCOUNTER — Ambulatory Visit (HOSPITAL_COMMUNITY)
Admission: RE | Admit: 2019-11-27 | Discharge: 2019-11-27 | Disposition: A | Discharge status: Home / Self Care | Payer: BC Managed Care – PPO | Source: Ambulatory Visit | Attending: Nurse Practitioner | Admitting: Nurse Practitioner

## 2019-11-27 ENCOUNTER — Other Ambulatory Visit: Payer: Self-pay

## 2019-11-27 DIAGNOSIS — Z1231 Encounter for screening mammogram for malignant neoplasm of breast: Secondary | ICD-10-CM | POA: Insufficient documentation

## 2019-12-12 ENCOUNTER — Encounter: Payer: Self-pay | Admitting: Podiatry

## 2019-12-12 ENCOUNTER — Ambulatory Visit: Payer: BC Managed Care – PPO | Admitting: Podiatry

## 2019-12-12 ENCOUNTER — Ambulatory Visit (INDEPENDENT_AMBULATORY_CARE_PROVIDER_SITE_OTHER): Payer: BC Managed Care – PPO

## 2019-12-12 ENCOUNTER — Other Ambulatory Visit: Payer: Self-pay

## 2019-12-12 DIAGNOSIS — S92504A Nondisplaced unspecified fracture of right lesser toe(s), initial encounter for closed fracture: Secondary | ICD-10-CM

## 2019-12-12 DIAGNOSIS — S92502A Displaced unspecified fracture of left lesser toe(s), initial encounter for closed fracture: Secondary | ICD-10-CM | POA: Diagnosis not present

## 2019-12-12 DIAGNOSIS — M79672 Pain in left foot: Secondary | ICD-10-CM

## 2019-12-13 NOTE — Progress Notes (Signed)
Subjective:  Patient ID: Monica Bell, female    DOB: 07/03/1960,  MRN: [id number]  Chief Complaint  Patient presents with  . Toe Pain    Patient presents today for left 4th toe fracture    59 y.o. female presents with the above complaint.  Patient presents with a left fourth toe pain after she underwent an caught left fourth toe on a chair about 3 weeks ago.  Patient there is no bruising ecchymosis the pain has gotten little bit better since then.  Patient states still painful though.  She wants to get an x-ray to make sure that there is no fracture.  She has tried some various shoes but it is shooting pain and swelling associated.  Patient has tried some warm water soaks Advil elevation but none of that has helped.  She denies any other acute complaints.  She would like to discuss treatment options.   Review of Systems: Negative except as noted in the HPI. Denies N/V/F/Ch.  Past Medical History:  Diagnosis Date  . Diabetes mellitus   . Thyroid disease     Current Outpatient Medications:  .  fluconazole (DIFLUCAN) 200 MG tablet, Take by mouth., Disp: , Rfl:  .  Insulin Degludec (TRESIBA FLEXTOUCH [place]), Inject into the skin., Disp: , Rfl:  .  levothyroxine (SYNTHROID) 137 MCG tablet, Take 137 mcg by mouth daily., Disp: , Rfl:  .  losartan (COZAAR) 50 MG tablet, Take 50 mg by mouth daily., Disp: , Rfl:  .  meloxicam (MOBIC) 15 MG tablet, Take 1 tablet (15 mg total) by mouth daily., Disp: 30 tablet, Rfl: 0 .  metFORMIN (GLUCOPHAGE) 1000 MG tablet, Take 1,000 mg by mouth 2 (two) times daily with a meal., Disp: , Rfl:  .  montelukast (SINGULAIR) 10 MG tablet, Take 10 mg by mouth daily., Disp: , Rfl:  .  omeprazole (PRILOSEC) 20 MG capsule, Take 20 mg by mouth daily., Disp: , Rfl:  .  Semaglutide,0.25 or 0.5MG /DOS, (OZEMPIC, 0.25 OR 0.5 MG/DOSE,) 2 MG/1.5ML SOPN, , Disp: , Rfl:  .  SYNTHROID 150 MCG tablet, Take 150 mcg by mouth daily., Disp: , Rfl:   Social History   Tobacco Use    Smoking Status Current Every Day Smoker  . Types: Cigarettes  Smokeless Tobacco Never Used    No Known Allergies Objective:  There were no vitals filed for this visit. There is no height or weight on file to calculate BMI. Constitutional Well developed. Well nourished.  Vascular Dorsalis pedis pulses palpable bilaterally. Posterior tibial pulses palpable bilaterally. Capillary refill normal to all digits.  No cyanosis or clubbing noted. Pedal hair growth normal.  Neurologic Normal speech. Oriented to person, place, and time. Epicritic sensation to light touch grossly present bilaterally.  Dermatologic Nails well groomed and normal in appearance. No open wounds. No skin lesions.  Orthopedic:  Pain on palpation to the left fourth digit.  No ecchymosis noted.  Range of motion still intact at the MPJs and as well as the IPJ of the fourth digit.  Cap refill within normal limits   Radiographs: 3 views of skeletally mature adult left foot: Oblique fracture noted of the proximal phalanx in good position and alignment.  No angulation or translation of the capital fragment noted. Assessment:   1. Closed fracture of phalanx of left fourth toe, initial encounter    Plan:  Patient was evaluated and treated and all questions answered.  Left fourth digit proximal phalanx fracture -I reviewed my radiograph findings  with the patient in extensive detail.  Given that she still clinically has a lot of pain.  I believe patient will benefit from a surgical shoe to offload the forefoot and allow the proper osseous bone to heal adequately.  I also discussed with her the importance of buddy splinting as well.  Patient will utilize tape to continue doing buddy splinting as well as utilizing surgical shoe for next 4 weeks. -I will see her back in 4 weeks and obtain another x-ray.  At this point I discussed with the patient that I will treat the clinical pain as osseous healing can take many many months.   Patient states understanding.  No follow-ups on file.

## 2020-01-11 ENCOUNTER — Ambulatory Visit: Payer: BC Managed Care – PPO | Admitting: Podiatry

## 2020-03-04 ENCOUNTER — Other Ambulatory Visit: Payer: Self-pay

## 2020-03-04 ENCOUNTER — Encounter: Payer: Self-pay | Admitting: Orthopedic Surgery

## 2020-03-04 ENCOUNTER — Ambulatory Visit: Payer: BC Managed Care – PPO | Admitting: Orthopedic Surgery

## 2020-03-04 VITALS — BP 159/75 | HR 88 | Resp 16 | Ht 71.0 in | Wt 186.6 lb

## 2020-03-04 DIAGNOSIS — G8929 Other chronic pain: Secondary | ICD-10-CM

## 2020-03-04 DIAGNOSIS — M7501 Adhesive capsulitis of right shoulder: Secondary | ICD-10-CM | POA: Diagnosis not present

## 2020-03-04 DIAGNOSIS — M7502 Adhesive capsulitis of left shoulder: Secondary | ICD-10-CM | POA: Diagnosis not present

## 2020-03-04 DIAGNOSIS — M25511 Pain in right shoulder: Secondary | ICD-10-CM

## 2020-03-04 DIAGNOSIS — M25512 Pain in left shoulder: Secondary | ICD-10-CM

## 2020-03-04 NOTE — Patient Instructions (Signed)
Call for injections when needed try to space them out 4-6 months   You have received an injection of steroids into the joint. 15% of patients will have increased pain within the 24 hours postinjection.   This is transient and will go away.   We recommend that you use ice packs on the injection site for 20 minutes every 2 hours and extra strength Tylenol 2 tablets every 8 as needed until the pain resolves.  If you continue to have pain after taking the Tylenol and using the ice please call the office for further instructions.

## 2020-03-04 NOTE — Progress Notes (Signed)
Encounter Diagnoses  Name Primary?  . Adhesive capsulitis of right shoulder Yes  . Chronic right shoulder pain   . Chronic left shoulder pain     Chief Complaint  Patient presents with  . Shoulder Pain    left/not any better/20/80    59 year old female with bilateral shoulder pain from adhesive capsulitis.  The left shoulder has better range of motion than the right.  The patient started to have increasing pain especially at night.  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 After pinning the skin with alcohol and anesthetized the skin with ethyl chloride the subacromial space was injected using a 20-gauge needle. There were no complications  Sterile dressing was applied.    Procedure note the subacromial injection shoulder left   Verbal consent was obtained to inject the  Left   Shoulder  Timeout was completed to confirm the injection site is a subacromial space of the  left  shoulder  Medication used Depo-Medrol 40 mg and lidocaine 1% 3 cc  Anesthesia was provided by ethyl chloride  The injection was performed in the left  posterior subacromial space. After pinning the skin with alcohol and anesthetized the skin with ethyl chloride the subacromial space was injected using a 20-gauge needle. There were no complications  Sterile dressing was applied.   Call for  injections when needed

## 2020-03-14 ENCOUNTER — Other Ambulatory Visit
Admission: RE | Admit: 2020-03-14 | Discharge: 2020-03-14 | Disposition: A | Discharge status: Home / Self Care | Payer: BC Managed Care – PPO | Source: Ambulatory Visit | Attending: Gastroenterology | Admitting: Gastroenterology

## 2020-03-14 ENCOUNTER — Other Ambulatory Visit: Payer: Self-pay

## 2020-03-14 DIAGNOSIS — Z20822 Contact with and (suspected) exposure to covid-19: Secondary | ICD-10-CM | POA: Insufficient documentation

## 2020-03-14 DIAGNOSIS — Z01812 Encounter for preprocedural laboratory examination: Secondary | ICD-10-CM | POA: Insufficient documentation

## 2020-03-15 ENCOUNTER — Encounter: Payer: Self-pay | Admitting: *Deleted

## 2020-03-15 LAB — SARS CORONAVIRUS 2 (TAT 6-24 HRS): SARS Coronavirus 2: NEGATIVE

## 2020-03-18 ENCOUNTER — Ambulatory Visit: Payer: BC Managed Care – PPO | Admitting: Anesthesiology

## 2020-03-18 ENCOUNTER — Encounter
Admission: RE | Disposition: A | Discharge status: Home / Self Care | Payer: Self-pay | Source: Home / Self Care | Attending: Gastroenterology

## 2020-03-18 ENCOUNTER — Ambulatory Visit
Admission: RE | Admit: 2020-03-18 | Discharge: 2020-03-18 | Disposition: A | Discharge status: Home / Self Care | Payer: BC Managed Care – PPO | Attending: Gastroenterology | Admitting: Gastroenterology

## 2020-03-18 ENCOUNTER — Encounter: Payer: Self-pay | Admitting: *Deleted

## 2020-03-18 DIAGNOSIS — Z86010 Personal history of colonic polyps: Secondary | ICD-10-CM | POA: Diagnosis not present

## 2020-03-18 DIAGNOSIS — K640 First degree hemorrhoids: Secondary | ICD-10-CM | POA: Diagnosis not present

## 2020-03-18 DIAGNOSIS — Z794 Long term (current) use of insulin: Secondary | ICD-10-CM | POA: Diagnosis not present

## 2020-03-18 DIAGNOSIS — Z79890 Hormone replacement therapy (postmenopausal): Secondary | ICD-10-CM | POA: Insufficient documentation

## 2020-03-18 DIAGNOSIS — Z79899 Other long term (current) drug therapy: Secondary | ICD-10-CM | POA: Diagnosis not present

## 2020-03-18 DIAGNOSIS — Z1211 Encounter for screening for malignant neoplasm of colon: Secondary | ICD-10-CM | POA: Diagnosis present

## 2020-03-18 DIAGNOSIS — D120 Benign neoplasm of cecum: Secondary | ICD-10-CM | POA: Diagnosis not present

## 2020-03-18 HISTORY — DX: Stress incontinence (female) (male): N39.3

## 2020-03-18 HISTORY — PX: COLONOSCOPY WITH PROPOFOL: SHX5780

## 2020-03-18 HISTORY — DX: Gastro-esophageal reflux disease without esophagitis: K21.9

## 2020-03-18 HISTORY — DX: Essential (primary) hypertension: I10

## 2020-03-18 HISTORY — DX: Hypothyroidism, unspecified: E03.9

## 2020-03-18 HISTORY — DX: Cardiac murmur, unspecified: R01.1

## 2020-03-18 HISTORY — DX: Cystocele, unspecified: N81.10

## 2020-03-18 HISTORY — DX: Other abnormal and inconclusive findings on diagnostic imaging of breast: R92.8

## 2020-03-18 LAB — GLUCOSE, CAPILLARY: Glucose-Capillary: 75 mg/dL (ref 70–99)

## 2020-03-18 SURGERY — COLONOSCOPY WITH PROPOFOL
Anesthesia: General

## 2020-03-18 MED ORDER — PROPOFOL 10 MG/ML IV BOLUS
INTRAVENOUS | Status: DC | PRN
  Administered 2020-03-18: 60 mg via INTRAVENOUS

## 2020-03-18 MED ORDER — PROPOFOL 500 MG/50ML IV EMUL
INTRAVENOUS | Status: DC | PRN
  Administered 2020-03-18: 200 ug/kg/min via INTRAVENOUS

## 2020-03-18 MED ORDER — SODIUM CHLORIDE 0.9 % IV SOLN
INTRAVENOUS | Status: DC
  Administered 2020-03-18: 1000 mL via INTRAVENOUS

## 2020-03-18 NOTE — Interval H&P Note (Signed)
History and Physical Interval Note:  03/18/2020 10:45 AM  Monica Bell  has presented today for surgery, with the diagnosis of hx colon polyps.  The various methods of treatment have been discussed with the patient and family. After consideration of risks, benefits and other options for treatment, the patient has consented to  Procedure(s): COLONOSCOPY WITH PROPOFOL (N/A) as a surgical intervention.  The patient's history has been reviewed, patient examined, no change in status, stable for surgery.  I have reviewed the patient's chart and labs.  Questions were answered to the patient's satisfaction.     Lesly Rubenstein  Ok to proceed with colonoscopy

## 2020-03-18 NOTE — Transfer of Care (Signed)
Immediate Anesthesia Transfer of Care Note  Patient: Monica Bell  Procedure(s) Performed: COLONOSCOPY WITH PROPOFOL (N/A )  Patient Location: PACU  Anesthesia Type:General  Level of Consciousness: sedated  Airway & Oxygen Therapy: Patient Spontanous Breathing and Patient connected to nasal cannula oxygen  Post-op Assessment: Report given to RN and Post -op Vital signs reviewed and stable  Post vital signs: Reviewed and stable  Last Vitals:  Vitals Value Taken Time  BP    Temp    Pulse    Resp    SpO2      Last Pain:  Vitals:   03/18/20 1012  TempSrc: Temporal  PainSc: 0-No pain         Complications: No complications documented.

## 2020-03-18 NOTE — Anesthesia Procedure Notes (Signed)
Date/Time: 03/18/2020 10:59 AM Performed by: Nelda Marseille, CRNA Pre-anesthesia Checklist: Patient identified, Emergency Drugs available, Suction available, Patient being monitored and Timeout performed Oxygen Delivery Method: Nasal cannula

## 2020-03-18 NOTE — Op Note (Signed)
[facility] Gastroenterology Patient Name: Monica Bell Procedure Date: 03/18/2020 10:44 AM MRN: [id number] Account #: [id number] Date of Birth: [birth year] Admit Type: Outpatient Age: 59 Room: [facility] ENDO ROOM 3 Gender: Female Note Status: Finalized Procedure:             Colonoscopy Indications:           High risk colon cancer surveillance: Personal history                         of colonic polyps Providers:             Andrey Farmer MD, MD Medicines:             Monitored Anesthesia Care Complications:         No immediate complications. Estimated blood loss:                         Minimal. Procedure:             Pre-Anesthesia Assessment:                        - Prior to the procedure, a History and Physical was                         performed, and patient medications and allergies were                         reviewed. The patient is competent. The risks and                         benefits of the procedure and the sedation options and                         risks were discussed with the patient. All questions                         were answered and informed consent was obtained.                         Patient identification and proposed procedure were                         verified by the physician, the nurse, the anesthetist                         and the technician in the endoscopy suite. Mental                         Status Examination: alert and oriented. Airway                         Examination: normal oropharyngeal airway and neck                         mobility. Respiratory Examination: clear to                         auscultation. CV Examination: normal. Prophylactic  Antibiotics: The patient does not require prophylactic                         antibiotics. Prior Anticoagulants: The patient has                         taken no previous anticoagulant or antiplatelet                         agents. ASA  Grade Assessment: II - A patient with mild                         systemic disease. After reviewing the risks and                         benefits, the patient was deemed in satisfactory                         condition to undergo the procedure. The anesthesia                         plan was to use monitored anesthesia care (MAC).                         Immediately prior to administration of medications,                         the patient was re-assessed for adequacy to receive                         sedatives. The heart rate, respiratory rate, oxygen                         saturations, blood pressure, adequacy of pulmonary                         ventilation, and response to care were monitored                         throughout the procedure. The physical status of the                         patient was re-assessed after the procedure.                        After obtaining informed consent, the colonoscope was                         passed under direct vision. Throughout the procedure,                         the patient's blood pressure, pulse, and oxygen                         saturations were monitored continuously. The                         Colonoscope was introduced through the anus and  advanced to the the cecum, identified by appendiceal                         orifice and ileocecal valve. The colonoscopy was                         performed without difficulty. The patient tolerated                         the procedure well. The quality of the bowel                         preparation was good. Findings:      The perianal and digital rectal examinations were normal.      A 10 mm polyp was found in the ileocecal valve. The polyp was       carpet-like. The polyp was removed with a saline injection-lift       technique using a hot snare. Resection and retrieval were complete. To       prevent bleeding post-intervention, three hemostatic clips  were       successfully placed. There was no bleeding during, or at the end, of the       procedure. Due to computer issues the pictures did not capture. The       polyp was at the base of the same fold as the ileocecal valve. Area was       not tattooed due to clear anatomical land mark of ileocecal valve.      Non-bleeding internal hemorrhoids were found during retroflexion. The       hemorrhoids were Grade I (internal hemorrhoids that do not prolapse).      The exam was otherwise without abnormality on direct and retroflexion       views. Impression:            - One 10 mm polyp at the ileocecal valve, removed                         using injection-lift and a hot snare. Resected and                         retrieved. Clips were placed.                        - Non-bleeding internal hemorrhoids.                        - The examination was otherwise normal on direct and                         retroflexion views. Recommendation:        - Discharge patient to home.                        - Resume previous diet.                        - Continue present medications.                        - Await pathology results.                        -  Repeat colonoscopy for surveillance based on                         pathology results.                        - Return to referring physician as previously                         scheduled. Procedure Code(s):     --- Professional ---                        [phone], Colonoscopy, flexible; with removal of                         tumor(s), polyp(s), or other lesion(s) by snare                         technique                        [id number], Colonoscopy, flexible; with directed submucosal                         injection(s), any substance Diagnosis Code(s):     --- Professional ---                        Z86.010, Personal history of colonic polyps                        K63.5, Polyp of colon                        K64.0, First degree hemorrhoids CPT  copyright 2019 American Medical Association. All rights reserved. The codes documented in this report are preliminary and upon coder review may  be revised to meet current compliance requirements. Andrey Farmer, MD Andrey Farmer MD, MD 03/18/2020 11:37:26 AM Number of Addenda: 0 Note Initiated On: 03/18/2020 10:44 AM Scope Withdrawal Time: 0 hours 21 minutes 53 seconds  Total Procedure Duration: 0 hours 28 minutes 34 seconds  Estimated Blood Loss:  Estimated blood loss was minimal.      [facility]

## 2020-03-18 NOTE — H&P (Signed)
Outpatient short stay form Pre-procedure 03/18/2020 10:43 AM Monica Miyamoto MD, MPH  Primary Physician: NP Ahmed Prima  Reason for visit:  Surveillance colonoscopy  History of present illness:   59 y/o lady with history of adenomatous polyp here for surveillance colonoscopy. No family history of GI malignancies. No abdominal surgeries.. No blood thinners.    Current Facility-Administered Medications:  .  0.9 %  sodium chloride infusion, , Intravenous, Continuous, Saamir Armstrong, Hilton Cork, MD, Last Rate: 20 mL/hr at 03/18/20 1025, 1,000 mL at 03/18/20 1025  Medications Prior to Admission  Medication Sig Dispense Refill Last Dose  . Calcium Carbonate-Vitamin D (CALTRATE 600+D PO) Take 1 tablet by mouth daily.   Past Week at Unknown time  . cetirizine (ZYRTEC) 10 MG tablet Take 10 mg by mouth daily.   Past Week at Unknown time  . fluconazole (DIFLUCAN) 150 MG tablet Take 150 mg by mouth once.   Past Week at Unknown time  . Insulin Degludec (TRESIBA FLEXTOUCH [place]) Inject into the skin.   Past Week at Unknown time  . Insulin Syringe-Needle U-100 (ULTICARE INSULIN SYRINGE) 31G X 1/4" 0.3 ML MISC by Does not apply route as needed.   Past Week at Unknown time  . losartan (COZAAR) 50 MG tablet Take 50 mg by mouth daily.   Past Week at Unknown time  . magnesium oxide (MAG-OX) 400 MG tablet Take 400 mg by mouth daily.   Past Week at Unknown time  . metFORMIN (GLUCOPHAGE) 1000 MG tablet Take 1,000 mg by mouth 2 (two) times daily with a meal.   Past Week at Unknown time  . omeprazole (PRILOSEC) 20 MG capsule Take 20 mg by mouth daily.   Past Week at Unknown time  . Semaglutide,0.25 or 0.5MG /DOS, (OZEMPIC, 0.25 OR 0.5 MG/DOSE,) 2 MG/1.5ML SOPN    Past Week at Unknown time  . SYNTHROID 150 MCG tablet Take 150 mcg by mouth daily.   Past Week at Unknown time  . levothyroxine (SYNTHROID) 150 MCG tablet Take 150 mcg by mouth daily.      . montelukast (SINGULAIR) 10 MG tablet Take 10 mg by mouth daily. (Patient not  taking: Reported on 03/18/2020)   Not Taking at Unknown time  . Omega-3 Fatty Acids (FISH OIL OMEGA-3) 1000 MG CAPS Take 1 capsule by mouth daily. (Patient not taking: Reported on 03/18/2020)   Not Taking at Unknown time     No Known Allergies   Past Medical History:  Diagnosis Date  . Abnormal mammogram   . Allergic rhinitis due to pollen 08/01/2019  . Arthritis 01/06/2019   osteoarthritis of both knees  . BMI 28.0-28.9,adult 08/01/2019  . Cystocele, unspecified (CODE)   . Diabetes mellitus   . GERD (gastroesophageal reflux disease)   . Heart murmur   . Hyperlipemia, mixed 12/14/2018  . Hypertension   . Hypothyroidism   . Stress incontinence, female   . Thyroid disease   . Type 2 diabetes mellitus, without long-term current use of insulin ([place]) 01/06/2019  . Vitamin D deficiency 01/06/2019    Review of systems:  Otherwise negative.    Physical Exam  Gen: Alert, oriented. Appears stated age.  HEENT: PERRLA. Lungs: No respiratory distress CV: RRR Abd: soft, benign, no masses. Ext: No edema.     Planned procedures: Proceed with colonoscopy. The patient understands the nature of the planned procedure, indications, risks, alternatives and potential complications including but not limited to bleeding, infection, perforation, damage to internal organs and possible oversedation/side effects from anesthesia. The  patient agrees and gives consent to proceed.  Please refer to procedure notes for findings, recommendations and patient disposition/instructions.     Monica Miyamoto MD, MPH Gastroenterology 03/18/2020  10:43 AM

## 2020-03-18 NOTE — Anesthesia Preprocedure Evaluation (Signed)
Anesthesia Evaluation  Patient identified by MRN, date of birth, ID band Patient awake    Reviewed: Allergy & Precautions, H&P , NPO status , Patient's Chart, lab work & pertinent test results, reviewed documented beta blocker date and time   Airway Mallampati: II   Neck ROM: full    Dental  (+) Poor Dentition   Pulmonary neg pulmonary ROS, Current Smoker and Patient abstained from smoking.,    Pulmonary exam normal        Cardiovascular Exercise Tolerance: Good hypertension, On Medications negative cardio ROS Normal cardiovascular exam Rhythm:regular Rate:Normal     Neuro/Psych negative neurological ROS  negative psych ROS   GI/Hepatic Neg liver ROS, GERD  Medicated,  Endo/Other  diabetes, Well ControlledHypothyroidism   Renal/GU negative Renal ROS  negative genitourinary   Musculoskeletal   Abdominal   Peds  Hematology negative hematology ROS (+)   Anesthesia Other Findings Past Medical History: No date: Abnormal mammogram 08/01/2019: Allergic rhinitis due to pollen 01/06/2019: Arthritis     Comment:  osteoarthritis of both knees 08/01/2019: BMI 28.0-28.9,adult No date: Cystocele, unspecified (CODE) No date: Diabetes mellitus No date: GERD (gastroesophageal reflux disease) No date: Heart murmur 12/14/2018: Hyperlipemia, mixed No date: Hypertension No date: Hypothyroidism No date: Stress incontinence, female No date: Thyroid disease 01/06/2019: Type 2 diabetes mellitus, without long-term current use  of insulin ([place]) 01/06/2019: Vitamin D deficiency Past Surgical History: No date: ROTATOR CUFF REPAIR No date: THYROIDECTOMY BMI    Body Mass Index: 25.86 kg/m     Reproductive/Obstetrics negative OB ROS                             Anesthesia Physical Anesthesia Plan  ASA: II  Anesthesia Plan: General   Post-op Pain Management:    Induction:   PONV Risk Score and  Plan:   Airway Management Planned:   Additional Equipment:   Intra-op Plan:   Post-operative Plan:   Informed Consent: I have reviewed the patients History and Physical, chart, labs and discussed the procedure including the risks, benefits and alternatives for the proposed anesthesia with the patient or authorized representative who has indicated his/her understanding and acceptance.     Dental Advisory Given  Plan Discussed with: CRNA  Anesthesia Plan Comments:         Anesthesia Quick Evaluation

## 2020-03-19 ENCOUNTER — Encounter: Payer: Self-pay | Admitting: Gastroenterology

## 2020-03-19 LAB — SURGICAL PATHOLOGY

## 2020-03-20 NOTE — Anesthesia Postprocedure Evaluation (Signed)
Anesthesia Post Note  Patient: Monica Bell  Procedure(s) Performed: COLONOSCOPY WITH PROPOFOL (N/A )  Patient location during evaluation: PACU Anesthesia Type: General Level of consciousness: awake and alert Pain management: pain level controlled Vital Signs Assessment: post-procedure vital signs reviewed and stable Respiratory status: spontaneous breathing, nonlabored ventilation, respiratory function stable and patient connected to nasal cannula oxygen Cardiovascular status: blood pressure returned to baseline and stable Postop Assessment: no apparent nausea or vomiting Anesthetic complications: no   No complications documented.   Last Vitals:  Vitals:   03/18/20 1149 03/18/20 1159  BP: (!) 153/71 (!) 163/71  Pulse:    Resp:    Temp:    SpO2:      Last Pain:  Vitals:   03/18/20 1159  TempSrc:   PainSc: 0-No pain                 Molli Barrows

## 2020-04-02 ENCOUNTER — Ambulatory Visit: Payer: BC Managed Care – PPO | Admitting: Orthopedic Surgery

## 2020-04-04 ENCOUNTER — Ambulatory Visit: Payer: BC Managed Care – PPO | Admitting: Orthopedic Surgery

## 2020-06-20 ENCOUNTER — Ambulatory Visit (INDEPENDENT_AMBULATORY_CARE_PROVIDER_SITE_OTHER): Payer: BC Managed Care – PPO | Admitting: Orthopedic Surgery

## 2020-06-20 ENCOUNTER — Encounter: Payer: Self-pay | Admitting: Orthopedic Surgery

## 2020-06-20 ENCOUNTER — Other Ambulatory Visit: Payer: Self-pay

## 2020-06-20 VITALS — BP 176/83 | HR 85 | Ht 71.0 in | Wt 185.0 lb

## 2020-06-20 DIAGNOSIS — E119 Type 2 diabetes mellitus without complications: Secondary | ICD-10-CM

## 2020-06-20 DIAGNOSIS — M7501 Adhesive capsulitis of right shoulder: Secondary | ICD-10-CM | POA: Diagnosis not present

## 2020-06-20 DIAGNOSIS — Z794 Long term (current) use of insulin: Secondary | ICD-10-CM

## 2020-06-20 DIAGNOSIS — G8929 Other chronic pain: Secondary | ICD-10-CM | POA: Diagnosis not present

## 2020-06-20 DIAGNOSIS — M25512 Pain in left shoulder: Secondary | ICD-10-CM | POA: Diagnosis not present

## 2020-06-20 NOTE — Progress Notes (Signed)
Chief Complaint  Patient presents with  . Shoulder Pain    Bilateral wants injections     Encounter Diagnoses  Name Primary?  . Adhesive capsulitis of right shoulder Yes  . Chronic right shoulder pain   . Chronic left shoulder pain   . Diabetes mellitus type 2, insulin dependent (HCC)     A steroid injection was performed at right subacromial space using 1% plain Lidocaine and 6 mg of Celestone. This was well tolerated.  I then repeated the procedure left subacromial space  F/u prn

## 2020-08-14 ENCOUNTER — Ambulatory Visit: Payer: BC Managed Care – PPO | Admitting: Podiatry

## 2020-08-14 ENCOUNTER — Other Ambulatory Visit: Payer: Self-pay | Admitting: Podiatry

## 2020-08-14 DIAGNOSIS — L84 Corns and callosities: Secondary | ICD-10-CM

## 2020-10-29 ENCOUNTER — Other Ambulatory Visit: Payer: Self-pay

## 2020-10-29 ENCOUNTER — Encounter: Payer: Self-pay | Admitting: Podiatry

## 2020-10-29 ENCOUNTER — Ambulatory Visit: Payer: BC Managed Care – PPO | Admitting: Podiatry

## 2020-10-29 DIAGNOSIS — M2041 Other hammer toe(s) (acquired), right foot: Secondary | ICD-10-CM

## 2020-10-29 DIAGNOSIS — M2042 Other hammer toe(s) (acquired), left foot: Secondary | ICD-10-CM

## 2020-10-29 NOTE — Progress Notes (Signed)
Subjective:  Patient ID: Monica Bell, female    DOB: [birth year],  MRN: [id number]  Chief Complaint  Patient presents with   Callouses    Bilateral 5th toe corns     60 y.o. female presents with the above complaint.  Patient presents with complaint of bilateral fifth digit hyperkeratotic lesion.  Patient states is painful to touch painful with ambulation.  She does have hammertoe contractures.  She states to wear shoes that tend to be very tight around the forefoot.  She has denies any other acute complaints.  She has not tried anything for it.  She has not seen anyone else prior to seeing me.  She was last seen by me for fracture which has healed.  She denies any other acute complaints.   Review of Systems: Negative except as noted in the HPI. Denies N/V/F/Ch.  Past Medical History:  Diagnosis Date   Abnormal mammogram    Allergic rhinitis due to pollen 08/01/2019   Arthritis 01/06/2019   osteoarthritis of both knees   BMI 28.0-28.9,adult 08/01/2019   Cystocele, unspecified (CODE)    Diabetes mellitus    GERD (gastroesophageal reflux disease)    Heart murmur    Hyperlipemia, mixed 12/14/2018   Hypertension    Hypothyroidism    Stress incontinence, female    Thyroid disease    Type 2 diabetes mellitus, without long-term current use of insulin ([place]) 01/06/2019   Vitamin D deficiency 01/06/2019    Current Outpatient Medications:    Calcium Carbonate-Vitamin D (CALTRATE 600+D PO), Take 1 tablet by mouth daily., Disp: , Rfl:    cetirizine (ZYRTEC) 10 MG tablet, Take 10 mg by mouth daily., Disp: , Rfl:    fluconazole (DIFLUCAN) 150 MG tablet, Take 150 mg by mouth once., Disp: , Rfl:    Insulin Degludec (TRESIBA FLEXTOUCH [place]), Inject into the skin., Disp: , Rfl:    Insulin Syringe-Needle U-100 31G X 1/4" 0.3 ML MISC, by Does not apply route as needed., Disp: , Rfl:    levothyroxine (SYNTHROID) 150 MCG tablet, Take 150 mcg by mouth daily. , Disp: , Rfl:    losartan (COZAAR)  50 MG tablet, Take 50 mg by mouth daily., Disp: , Rfl:    magnesium oxide (MAG-OX) 400 MG tablet, Take 400 mg by mouth daily., Disp: , Rfl:    metFORMIN (GLUCOPHAGE) 1000 MG tablet, Take 1,000 mg by mouth 2 (two) times daily with a meal., Disp: , Rfl:    montelukast (SINGULAIR) 10 MG tablet, Take 10 mg by mouth daily., Disp: , Rfl:    Omega-3 Fatty Acids (FISH OIL OMEGA-3) 1000 MG CAPS, Take 1 capsule by mouth daily., Disp: , Rfl:    omeprazole (PRILOSEC) 20 MG capsule, Take 20 mg by mouth daily., Disp: , Rfl:    Semaglutide,0.25 or 0.5MG /DOS, 2 MG/1.5ML SOPN, , Disp: , Rfl:    SYNTHROID 150 MCG tablet, Take 150 mcg by mouth daily., Disp: , Rfl:   Social History   Tobacco Use  Smoking Status Every Day   Packs/day: 0.50   Years: 15.00   Pack years: 7.50   Types: Cigarettes  Smokeless Tobacco Never    No Known Allergies Objective:  There were no vitals filed for this visit. There is no height or weight on file to calculate BMI. Constitutional Well developed. Well nourished.  Vascular Dorsalis pedis pulses palpable bilaterally. Posterior tibial pulses palpable bilaterally. Capillary refill normal to all digits.  No cyanosis or clubbing noted. Pedal hair growth normal.  Neurologic Normal speech. Oriented to person, place, and time. Epicritic sensation to light touch grossly present bilaterally.  Dermatologic Hyperkeratotic lesion with underlying hammertoe contracture semiflexible in nature noted to bilateral fifth digit.  Adductovarus rotation noted.  Orthopedic: Normal joint ROM without pain or crepitus bilaterally. No visible deformities. No bony tenderness.   Radiographs: None Assessment:   1. Hammertoe of right foot   2. Hammertoe of left foot    Plan:  Patient was evaluated and treated and all questions answered.  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  Given that there is no rotation as well as polyp formation I discussed shoe gear modification extensive detail.  Also discussed with her she will benefit from toe protectors as well.  If these do not help and I discussed with her she would need surgical intervention to help correct the fifth toe deformity.  Patient states understanding and would like to try conservative care per first -I will see her back again in 6 weeks and we will reevaluate.  No follow-ups on file.

## 2020-11-18 ENCOUNTER — Other Ambulatory Visit (HOSPITAL_COMMUNITY): Payer: Self-pay | Admitting: Nurse Practitioner

## 2020-11-18 DIAGNOSIS — Z1231 Encounter for screening mammogram for malignant neoplasm of breast: Secondary | ICD-10-CM

## 2020-12-02 ENCOUNTER — Ambulatory Visit (HOSPITAL_COMMUNITY)
Admission: RE | Admit: 2020-12-02 | Discharge: 2020-12-02 | Disposition: A | Discharge status: Home / Self Care | Payer: BC Managed Care – PPO | Source: Ambulatory Visit | Attending: Nurse Practitioner | Admitting: Nurse Practitioner

## 2020-12-02 ENCOUNTER — Other Ambulatory Visit: Payer: Self-pay

## 2020-12-02 DIAGNOSIS — Z1231 Encounter for screening mammogram for malignant neoplasm of breast: Secondary | ICD-10-CM | POA: Diagnosis not present

## 2020-12-05 ENCOUNTER — Emergency Department (HOSPITAL_COMMUNITY): Payer: BC Managed Care – PPO

## 2020-12-05 ENCOUNTER — Encounter (HOSPITAL_COMMUNITY): Payer: Self-pay | Admitting: *Deleted

## 2020-12-05 ENCOUNTER — Other Ambulatory Visit: Payer: Self-pay

## 2020-12-05 ENCOUNTER — Inpatient Hospital Stay (HOSPITAL_COMMUNITY)
Admission: EM | Admit: 2020-12-05 | Discharge: 2020-12-07 | DRG: 247 | Disposition: A | Discharge status: Home / Self Care | Payer: BC Managed Care – PPO | Attending: Internal Medicine | Admitting: Internal Medicine

## 2020-12-05 DIAGNOSIS — F17210 Nicotine dependence, cigarettes, uncomplicated: Secondary | ICD-10-CM | POA: Diagnosis present

## 2020-12-05 DIAGNOSIS — I214 Non-ST elevation (NSTEMI) myocardial infarction: Principal | ICD-10-CM | POA: Diagnosis present

## 2020-12-05 DIAGNOSIS — E890 Postprocedural hypothyroidism: Secondary | ICD-10-CM | POA: Diagnosis present

## 2020-12-05 DIAGNOSIS — D72829 Elevated white blood cell count, unspecified: Secondary | ICD-10-CM | POA: Diagnosis present

## 2020-12-05 DIAGNOSIS — Z79899 Other long term (current) drug therapy: Secondary | ICD-10-CM

## 2020-12-05 DIAGNOSIS — Z79890 Hormone replacement therapy (postmenopausal): Secondary | ICD-10-CM

## 2020-12-05 DIAGNOSIS — I255 Ischemic cardiomyopathy: Secondary | ICD-10-CM | POA: Diagnosis present

## 2020-12-05 DIAGNOSIS — E785 Hyperlipidemia, unspecified: Secondary | ICD-10-CM

## 2020-12-05 DIAGNOSIS — Z8249 Family history of ischemic heart disease and other diseases of the circulatory system: Secondary | ICD-10-CM

## 2020-12-05 DIAGNOSIS — Z794 Long term (current) use of insulin: Secondary | ICD-10-CM

## 2020-12-05 DIAGNOSIS — E119 Type 2 diabetes mellitus without complications: Secondary | ICD-10-CM

## 2020-12-05 DIAGNOSIS — N289 Disorder of kidney and ureter, unspecified: Secondary | ICD-10-CM | POA: Diagnosis present

## 2020-12-05 DIAGNOSIS — I110 Hypertensive heart disease with heart failure: Secondary | ICD-10-CM | POA: Diagnosis present

## 2020-12-05 DIAGNOSIS — F17200 Nicotine dependence, unspecified, uncomplicated: Secondary | ICD-10-CM

## 2020-12-05 DIAGNOSIS — Z833 Family history of diabetes mellitus: Secondary | ICD-10-CM

## 2020-12-05 DIAGNOSIS — I5020 Unspecified systolic (congestive) heart failure: Secondary | ICD-10-CM | POA: Diagnosis present

## 2020-12-05 DIAGNOSIS — E039 Hypothyroidism, unspecified: Secondary | ICD-10-CM | POA: Diagnosis present

## 2020-12-05 DIAGNOSIS — I169 Hypertensive crisis, unspecified: Secondary | ICD-10-CM | POA: Diagnosis present

## 2020-12-05 DIAGNOSIS — Z809 Family history of malignant neoplasm, unspecified: Secondary | ICD-10-CM

## 2020-12-05 DIAGNOSIS — Z20822 Contact with and (suspected) exposure to covid-19: Secondary | ICD-10-CM | POA: Diagnosis present

## 2020-12-05 DIAGNOSIS — K219 Gastro-esophageal reflux disease without esophagitis: Secondary | ICD-10-CM | POA: Diagnosis present

## 2020-12-05 DIAGNOSIS — I161 Hypertensive emergency: Secondary | ICD-10-CM | POA: Diagnosis present

## 2020-12-05 DIAGNOSIS — E1165 Type 2 diabetes mellitus with hyperglycemia: Secondary | ICD-10-CM | POA: Diagnosis present

## 2020-12-05 DIAGNOSIS — R079 Chest pain, unspecified: Secondary | ICD-10-CM | POA: Diagnosis present

## 2020-12-05 DIAGNOSIS — Z825 Family history of asthma and other chronic lower respiratory diseases: Secondary | ICD-10-CM

## 2020-12-05 DIAGNOSIS — M170 Bilateral primary osteoarthritis of knee: Secondary | ICD-10-CM | POA: Diagnosis present

## 2020-12-05 DIAGNOSIS — E782 Mixed hyperlipidemia: Secondary | ICD-10-CM | POA: Diagnosis present

## 2020-12-05 DIAGNOSIS — I249 Acute ischemic heart disease, unspecified: Secondary | ICD-10-CM

## 2020-12-05 MED ORDER — ALUM & MAG HYDROXIDE-SIMETH 200-200-20 MG/5ML PO SUSP
30.0000 mL | Freq: Once | ORAL | Status: AC
  Administered 2020-12-05: 30 mL via ORAL
  Filled 2020-12-05: qty 30

## 2020-12-05 NOTE — ED Provider Notes (Signed)
[facility] EMERGENCY DEPARTMENT Provider Note   CSN: [id number] Arrival date & time: 12/05/20  2253     History Chief Complaint  Patient presents with   Chest Pain    Monica Bell is a 60 y.o. female.  The history is provided by the patient.  Chest Pain She has history of hypertension, diabetes, hyperlipidemia and comes in because of burning pain in her chest.  It was present this morning and then resolved, recurred about 9 PM.  There is some radiation of pain to the jaw.  She rates pain at 8/10.  It is worse when she lays flat, better if she sits up.  Nothing else seems to affected.  She denies any dyspnea or nausea but there was some mild diaphoresis.  She did have similar episode several months ago and never sought medical attention.  She is a cigarette smoker.  She denies family history of coronary artery disease.   Past Medical History:  Diagnosis Date   Abnormal mammogram    Allergic rhinitis due to pollen 08/01/2019   Arthritis 01/06/2019   osteoarthritis of both knees   BMI 28.0-28.9,adult 08/01/2019   Cystocele, unspecified (CODE)    Diabetes mellitus    GERD (gastroesophageal reflux disease)    Heart murmur    Hyperlipemia, mixed 12/14/2018   Hypertension    Hypothyroidism    Stress incontinence, female    Thyroid disease    Type 2 diabetes mellitus, without long-term current use of insulin ([place]) 01/06/2019   Vitamin D deficiency 01/06/2019    Patient Active Problem List   Diagnosis Date Noted   Abnormal mammogram 05/17/2019   Diabetes mellitus type 2, insulin dependent ([place]) 05/17/2019   GERD (gastroesophageal reflux disease) 05/17/2019   Heart murmur 05/17/2019   Hyperlipidemia 05/17/2019   Chronic right shoulder pain 02/26/2012   Cystocele 02/26/2012   Stress incontinence, female 02/26/2012   Hypothyroidism 02/26/2012   Tobacco use disorder 02/26/2012    Past Surgical History:  Procedure Laterality Date   COLONOSCOPY WITH PROPOFOL N/A  03/18/2020   Procedure: COLONOSCOPY WITH PROPOFOL;  Surgeon: Lesly Rubenstein, MD;  Location: [facility] ENDOSCOPY;  Service: Endoscopy;  Laterality: N/A;   ROTATOR CUFF REPAIR     THYROIDECTOMY       OB History   No obstetric history on file.     Family History  Problem Relation Age of Onset   Diabetes Mother    Cancer Mother    COPD Father    Hypertension Father     Social History   Tobacco Use   Smoking status: Every Day    Packs/day: 0.50    Years: 15.00    Pack years: 7.50    Types: Cigarettes   Smokeless tobacco: Never  Vaping Use   Vaping Use: Never used  Substance Use Topics   Alcohol use: No   Drug use: No    Home Medications Prior to Admission medications   Medication Sig Start Date End Date Taking? Authorizing Provider  Calcium Carbonate-Vitamin D (CALTRATE 600+D PO) Take 1 tablet by mouth daily.    [provider]  cetirizine (ZYRTEC) 10 MG tablet Take 10 mg by mouth daily.    [provider]  fluconazole (DIFLUCAN) 150 MG tablet Take 150 mg by mouth once.    [provider]  Insulin Degludec (TRESIBA FLEXTOUCH [place]) Inject into the skin.    [provider]  Insulin Syringe-Needle U-100 31G X 1/4" 0.3 ML MISC by Does not apply  route as needed.    [provider]  levothyroxine (SYNTHROID) 150 MCG tablet Take 150 mcg by mouth daily.  08/05/19   [provider]  losartan (COZAAR) 50 MG tablet Take 50 mg by mouth daily.    [provider]  magnesium oxide (MAG-OX) 400 MG tablet Take 400 mg by mouth daily.    [provider]  metFORMIN (GLUCOPHAGE) 1000 MG tablet Take 1,000 mg by mouth 2 (two) times daily with a meal.    [provider]  montelukast (SINGULAIR) 10 MG tablet Take 10 mg by mouth daily. 08/01/19   [provider]  Omega-3 Fatty Acids (FISH OIL OMEGA-3) 1000 MG CAPS Take 1 capsule by mouth daily.    [provider]  omeprazole (PRILOSEC) 20 MG capsule Take  20 mg by mouth daily. 10/13/19   [provider]  Semaglutide,0.25 or 0.'5MG'$ /DOS, 2 MG/1.5ML SOPN  02/01/18   [provider]  SYNTHROID 150 MCG tablet Take 150 mcg by mouth daily. 05/16/13   [provider]    Allergies    Patient has no known allergies.  Review of Systems   Review of Systems  Cardiovascular:  Positive for chest pain.  All other systems reviewed and are negative.  Physical Exam Updated Vital Signs BP (!) 214/99 (BP Location: Left Arm)   Pulse 77   Temp 97.9 F (36.6 C)   Resp 18   Ht '5\' 11"'$  (1.803 m)   Wt 85.3 kg   SpO2 100%   BMI 26.22 kg/m   Physical Exam Vitals and nursing note reviewed.  60 year old female, resting comfortably and in no acute distress. Vital signs are significant for elevated blood pressure. Oxygen saturation is 100%, which is normal. Head is normocephalic and atraumatic. PERRLA, EOMI. Oropharynx is clear. Neck is nontender and supple without adenopathy or JVD. Back is nontender and there is no CVA tenderness. Lungs are clear without rales, wheezes, or rhonchi. Chest is nontender. Heart has regular rate and rhythm without murmur. Abdomen is soft, flat, nontender without masses or hepatosplenomegaly and peristalsis is normoactive. Extremities have no cyanosis or edema, full range of motion is present. Skin is warm and dry without rash. Neurologic: Mental status is normal, cranial nerves are intact, there are no motor or sensory deficits.  ED Results / Procedures / Treatments   Labs (all labs ordered are listed, but only abnormal results are displayed) Labs Reviewed  BASIC METABOLIC PANEL - Abnormal; Notable for the following components:      Result Value   Glucose, Bld 218 (*)    Creatinine, Ser 1.02 (*)    Calcium 8.4 (*)    All other components within normal limits  CBC WITH DIFFERENTIAL/PLATELET - Abnormal; Notable for the following components:   WBC 13.2 (*)    RDW 15.7 (*)    Lymphs Abs 5.0 (*)     All other components within normal limits  TROPONIN I (HIGH SENSITIVITY) - Abnormal; Notable for the following components:   Troponin I (High Sensitivity) 90 (*)    All other components within normal limits  TROPONIN I (HIGH SENSITIVITY) - Abnormal; Notable for the following components:   Troponin I (High Sensitivity) 149 (*)    All other components within normal limits  RESP PANEL BY RT-PCR (FLU A&B, COVID) ARPGX2  HEPARIN LEVEL (UNFRACTIONATED)  HEMOGLOBIN A1C  HIV ANTIBODY (ROUTINE TESTING W REFLEX)  BASIC METABOLIC PANEL  MAGNESIUM  LIPID PANEL  URINALYSIS, COMPLETE (UACMP) WITH MICROSCOPIC  MICROALBUMIN / CREATININE URINE RATIO  RAPID URINE DRUG SCREEN, HOSP PERFORMED  TROPONIN I (HIGH SENSITIVITY)    EKG EKG Interpretation  Date/Time:  Thursday December 05 2020 23:05:16 EDT Ventricular Rate:  74 PR Interval:  139 QRS Duration: 84 QT Interval:  395 QTC Calculation: 439 R Axis:   50 Text Interpretation: Sinus rhythm Low voltage, precordial leads Borderline repolarization abnormality When compared with ECG of 07/22/2013, No significant change was found Confirmed by Delora Fuel [id number]) on 12/05/2020 11:41:01 PM  Radiology DG Chest Port 1 View  Result Date: 12/05/2020 CLINICAL DATA:  Mid chest pain that radiates to right side in the neck. EXAM: PORTABLE CHEST 1 VIEW COMPARISON:  July 23, 2013 FINDINGS: The heart size and mediastinal contours are within normal limits. Aortic atherosclerosis. No focal consolidation. No pleural effusion. No pneumothorax. Degenerative change of the shoulders. Surgical anchors in the right humeral head. IMPRESSION: 1. No acute cardiopulmonary findings. 2.  Aortic Atherosclerosis (ICD10-I70.0). Electronically Signed   By: Dahlia Bailiff MD   On: 12/05/2020 23:59    Procedures Procedures  CRITICAL CARE Performed by: Delora Fuel Total critical care time: 50 minutes Critical care time was exclusive of separately billable procedures and treating other  patients. Critical care was necessary to treat or prevent imminent or life-threatening deterioration. Critical care was time spent personally by me on the following activities: development of treatment plan with patient and/or surrogate as well as nursing, discussions with consultants, evaluation of patient's response to treatment, examination of patient, obtaining history from patient or surrogate, ordering and performing treatments and interventions, ordering and review of laboratory studies, ordering and review of radiographic studies, pulse oximetry and re-evaluation of patient's condition.  Medications Ordered in ED Medications  nitroGLYCERIN (NITROSTAT) SL tablet 0.4 mg (has no administration in time range)  heparin ADULT infusion 100 units/mL (25000 units/248m) (1,000 Units/hr Intravenous New Bag/Given 12/06/20 0128)  aspirin EC tablet 81 mg (has no administration in time range)  nitroGLYCERIN (NITROSTAT) SL tablet 0.4 mg (has no administration in time range)  acetaminophen (TYLENOL) tablet 650 mg (has no administration in time range)  ondansetron (ZOFRAN) injection 4 mg (has no administration in time range)  insulin aspart (novoLOG) injection 0-9 Units (has no administration in time range)  0.9 %  sodium chloride infusion (has no administration in time range)  sodium chloride flush (NS) 0.9 % injection 3 mL (has no administration in time range)  pantoprazole (PROTONIX) EC tablet 40 mg (has no administration in time range)  alum & mag hydroxide-simeth (MAALOX/MYLANTA) 200-200-20 MG/5ML suspension 30 mL (30 mLs Oral Given 12/05/20 2345)  aspirin chewable tablet 324 mg (324 mg Oral Given 12/06/20 0126)  heparin bolus via infusion 4,000 Units (4,000 Units Intravenous Bolus from Bag 12/06/20 0128)    ED Course  I have reviewed the triage vital signs and the nursing notes.  Pertinent labs & imaging results that were available during my care of the patient were reviewed by me and considered in  my medical decision making (see chart for details).   MDM Rules/Calculators/A&P                         Chest pain of uncertain cause.  Burning nature of pain and worse with supine is suggestive of GERD.  ECG shows no acute changes, unchanged from prior.  We will give therapeutic trial of GI cocktail and also check troponin x2.  Old records are reviewed, and she has no relevant past  visits.  She had partial relief of pain with GI cocktail.  Blood pressure has come down slightly.  Troponin has come back mildly elevated at 90.  Repeat troponin will be obtained, but she will be treated for ACS.  She is given a nitroglycerin and aspirin and started on intravenous heparin.  Case is discussed with Dr. Myna Hidalgo of [facility], who agrees to admit the patient.  Remainder screening labs were unremarkable.  Repeat troponin has increased slightly.  Final Clinical Impression(s) / ED Diagnoses Final diagnoses:  ACS (acute coronary syndrome) [facility])    Rx / DC Orders ED Discharge Orders     None        Delora Fuel, MD [id number] [phone]

## 2020-12-05 NOTE — ED Triage Notes (Signed)
Pt c/o mid chest pain with a burning feeling that started today

## 2020-12-06 ENCOUNTER — Inpatient Hospital Stay (HOSPITAL_COMMUNITY)
Admission: EM | Disposition: A | Discharge status: Home / Self Care | Payer: Self-pay | Source: Home / Self Care | Attending: Family Medicine

## 2020-12-06 ENCOUNTER — Observation Stay (HOSPITAL_COMMUNITY): Payer: BC Managed Care – PPO

## 2020-12-06 ENCOUNTER — Encounter (HOSPITAL_COMMUNITY): Payer: Self-pay | Admitting: Family Medicine

## 2020-12-06 ENCOUNTER — Other Ambulatory Visit (HOSPITAL_COMMUNITY): Payer: Self-pay

## 2020-12-06 DIAGNOSIS — I255 Ischemic cardiomyopathy: Secondary | ICD-10-CM | POA: Diagnosis present

## 2020-12-06 DIAGNOSIS — I161 Hypertensive emergency: Secondary | ICD-10-CM | POA: Diagnosis present

## 2020-12-06 DIAGNOSIS — I214 Non-ST elevation (NSTEMI) myocardial infarction: Secondary | ICD-10-CM | POA: Diagnosis present

## 2020-12-06 DIAGNOSIS — E039 Hypothyroidism, unspecified: Secondary | ICD-10-CM

## 2020-12-06 DIAGNOSIS — N289 Disorder of kidney and ureter, unspecified: Secondary | ICD-10-CM

## 2020-12-06 DIAGNOSIS — E119 Type 2 diabetes mellitus without complications: Secondary | ICD-10-CM | POA: Diagnosis not present

## 2020-12-06 DIAGNOSIS — I110 Hypertensive heart disease with heart failure: Secondary | ICD-10-CM | POA: Diagnosis present

## 2020-12-06 DIAGNOSIS — Z79899 Other long term (current) drug therapy: Secondary | ICD-10-CM | POA: Diagnosis not present

## 2020-12-06 DIAGNOSIS — Z79890 Hormone replacement therapy (postmenopausal): Secondary | ICD-10-CM | POA: Diagnosis not present

## 2020-12-06 DIAGNOSIS — Z825 Family history of asthma and other chronic lower respiratory diseases: Secondary | ICD-10-CM | POA: Diagnosis not present

## 2020-12-06 DIAGNOSIS — I169 Hypertensive crisis, unspecified: Secondary | ICD-10-CM | POA: Diagnosis not present

## 2020-12-06 DIAGNOSIS — Z794 Long term (current) use of insulin: Secondary | ICD-10-CM

## 2020-12-06 DIAGNOSIS — M170 Bilateral primary osteoarthritis of knee: Secondary | ICD-10-CM | POA: Diagnosis present

## 2020-12-06 DIAGNOSIS — E782 Mixed hyperlipidemia: Secondary | ICD-10-CM | POA: Diagnosis present

## 2020-12-06 DIAGNOSIS — D72829 Elevated white blood cell count, unspecified: Secondary | ICD-10-CM | POA: Diagnosis present

## 2020-12-06 DIAGNOSIS — E890 Postprocedural hypothyroidism: Secondary | ICD-10-CM | POA: Diagnosis present

## 2020-12-06 DIAGNOSIS — Z8249 Family history of ischemic heart disease and other diseases of the circulatory system: Secondary | ICD-10-CM | POA: Diagnosis not present

## 2020-12-06 DIAGNOSIS — R079 Chest pain, unspecified: Secondary | ICD-10-CM | POA: Diagnosis not present

## 2020-12-06 DIAGNOSIS — Z833 Family history of diabetes mellitus: Secondary | ICD-10-CM | POA: Diagnosis not present

## 2020-12-06 DIAGNOSIS — R778 Other specified abnormalities of plasma proteins: Secondary | ICD-10-CM | POA: Diagnosis not present

## 2020-12-06 DIAGNOSIS — F17210 Nicotine dependence, cigarettes, uncomplicated: Secondary | ICD-10-CM | POA: Diagnosis present

## 2020-12-06 DIAGNOSIS — Z809 Family history of malignant neoplasm, unspecified: Secondary | ICD-10-CM | POA: Diagnosis not present

## 2020-12-06 DIAGNOSIS — K219 Gastro-esophageal reflux disease without esophagitis: Secondary | ICD-10-CM

## 2020-12-06 DIAGNOSIS — I5020 Unspecified systolic (congestive) heart failure: Secondary | ICD-10-CM | POA: Diagnosis present

## 2020-12-06 DIAGNOSIS — E1165 Type 2 diabetes mellitus with hyperglycemia: Secondary | ICD-10-CM | POA: Diagnosis present

## 2020-12-06 DIAGNOSIS — Z20822 Contact with and (suspected) exposure to covid-19: Secondary | ICD-10-CM | POA: Diagnosis present

## 2020-12-06 HISTORY — PX: CORONARY ULTRASOUND/IVUS: CATH118244

## 2020-12-06 HISTORY — PX: CORONARY STENT INTERVENTION: CATH118234

## 2020-12-06 HISTORY — PX: LEFT HEART CATH AND CORONARY ANGIOGRAPHY: CATH118249

## 2020-12-06 LAB — LIPID PANEL
Cholesterol: 201 mg/dL — ABNORMAL HIGH (ref 0–200)
HDL: 51 mg/dL (ref >40)
LDL Cholesterol: 134 mg/dL — ABNORMAL HIGH (ref 0–99)
Total CHOL/HDL Ratio: 3.9 RATIO
Triglycerides: 78 mg/dL (ref <150)
VLDL: 16 mg/dL (ref 0–40)

## 2020-12-06 LAB — URINALYSIS, COMPLETE (UACMP) WITH MICROSCOPIC
Bacteria, UA: NONE SEEN
Bilirubin Urine: NEGATIVE
Glucose, UA: NEGATIVE mg/dL
Hgb urine dipstick: NEGATIVE
Ketones, ur: NEGATIVE mg/dL
Nitrite: NEGATIVE
Protein, ur: NEGATIVE mg/dL
Specific Gravity, Urine: >1.046 — ABNORMAL HIGH (ref 1.005–1.030)
pH: 7 (ref 5.0–8.0)

## 2020-12-06 LAB — ECHOCARDIOGRAM COMPLETE
AR max vel: 1.92 cm2
AV Area VTI: 1.77 cm2
AV Area mean vel: 1.67 cm2
AV Mean grad: 4 mmHg
AV Peak grad: 7.7 mmHg
Ao pk vel: 1.39 m/s
Area-P 1/2: 4.33 cm2
Calc EF: 57.1 %
Height: 71 in
MV VTI: 1.62 cm2
S' Lateral: 3.52 cm
Single Plane A2C EF: 59 %
Single Plane A4C EF: 56.6 %
Weight: 3008 oz

## 2020-12-06 LAB — BASIC METABOLIC PANEL
Anion gap: 6 (ref 5–15)
Anion gap: 8 (ref 5–15)
BUN: 9 mg/dL (ref 6–20)
BUN: 9 mg/dL (ref 6–20)
CO2: 26 mmol/L (ref 22–32)
CO2: 26 mmol/L (ref 22–32)
Calcium: 8.4 mg/dL — ABNORMAL LOW (ref 8.9–10.3)
Calcium: 8.7 mg/dL — ABNORMAL LOW (ref 8.9–10.3)
Chloride: 105 mmol/L (ref 98–111)
Chloride: 106 mmol/L (ref 98–111)
Creatinine, Ser: 0.85 mg/dL (ref 0.44–1.00)
Creatinine, Ser: 1.02 mg/dL — ABNORMAL HIGH (ref 0.44–1.00)
GFR, Estimated: >60 mL/min (ref >60)
GFR, Estimated: >60 mL/min (ref >60)
Glucose, Bld: 218 mg/dL — ABNORMAL HIGH (ref 70–99)
Glucose, Bld: 96 mg/dL (ref 70–99)
Potassium: 3.7 mmol/L (ref 3.5–5.1)
Potassium: 3.9 mmol/L (ref 3.5–5.1)
Sodium: 137 mmol/L (ref 135–145)
Sodium: 140 mmol/L (ref 135–145)

## 2020-12-06 LAB — CBC WITH DIFFERENTIAL/PLATELET
Abs Immature Granulocytes: 0.04 10*3/uL (ref 0.00–0.07)
Basophils Absolute: 0.1 10*3/uL (ref 0.0–0.1)
Basophils Relative: 1 %
Eosinophils Absolute: 0.4 10*3/uL (ref 0.0–0.5)
Eosinophils Relative: 3 %
HCT: 37.4 % (ref 36.0–46.0)
Hemoglobin: 12 g/dL (ref 12.0–15.0)
Immature Granulocytes: 0 %
Lymphocytes Relative: 38 %
Lymphs Abs: 5 10*3/uL — ABNORMAL HIGH (ref 0.7–4.0)
MCH: 28.8 pg (ref 26.0–34.0)
MCHC: 32.1 g/dL (ref 30.0–36.0)
MCV: 89.7 fL (ref 80.0–100.0)
Monocytes Absolute: 1 10*3/uL (ref 0.1–1.0)
Monocytes Relative: 8 %
Neutro Abs: 6.7 10*3/uL (ref 1.7–7.7)
Neutrophils Relative %: 50 %
Platelets: 383 10*3/uL (ref 150–400)
RBC: 4.17 MIL/uL (ref 3.87–5.11)
RDW: 15.7 % — ABNORMAL HIGH (ref 11.5–15.5)
WBC: 13.2 10*3/uL — ABNORMAL HIGH (ref 4.0–10.5)
nRBC: 0 % (ref 0.0–0.2)

## 2020-12-06 LAB — RESP PANEL BY RT-PCR (FLU A&B, COVID) ARPGX2
Influenza A by PCR: NEGATIVE
Influenza B by PCR: NEGATIVE
SARS Coronavirus 2 by RT PCR: NEGATIVE

## 2020-12-06 LAB — HEPARIN LEVEL (UNFRACTIONATED)
Heparin Unfractionated: 0.76 IU/mL — ABNORMAL HIGH (ref 0.30–0.70)
Heparin Unfractionated: >1.1 IU/mL — ABNORMAL HIGH (ref 0.30–0.70)

## 2020-12-06 LAB — RAPID URINE DRUG SCREEN, HOSP PERFORMED
Amphetamines: POSITIVE — AB
Barbiturates: NOT DETECTED
Benzodiazepines: POSITIVE — AB
Cocaine: NOT DETECTED
Opiates: NOT DETECTED
Tetrahydrocannabinol: NOT DETECTED

## 2020-12-06 LAB — HEMOGLOBIN A1C
Hgb A1c MFr Bld: 9.4 % — ABNORMAL HIGH (ref 4.8–5.6)
Mean Plasma Glucose: 223.08 mg/dL

## 2020-12-06 LAB — HIV ANTIBODY (ROUTINE TESTING W REFLEX): HIV Screen 4th Generation wRfx: NONREACTIVE

## 2020-12-06 LAB — POCT ACTIVATED CLOTTING TIME
Activated Clotting Time: 410 seconds
Activated Clotting Time: 237 seconds

## 2020-12-06 LAB — MAGNESIUM: Magnesium: 2 mg/dL (ref 1.7–2.4)

## 2020-12-06 LAB — TROPONIN I (HIGH SENSITIVITY)
Troponin I (High Sensitivity): 1050 ng/L (ref <18)
Troponin I (High Sensitivity): 149 ng/L (ref <18)
Troponin I (High Sensitivity): 396 ng/L (ref <18)
Troponin I (High Sensitivity): 734 ng/L (ref <18)
Troponin I (High Sensitivity): 90 ng/L — ABNORMAL HIGH (ref <18)

## 2020-12-06 LAB — CBG MONITORING, ED
Glucose-Capillary: 110 mg/dL — ABNORMAL HIGH (ref 70–99)
Glucose-Capillary: 90 mg/dL (ref 70–99)

## 2020-12-06 LAB — GLUCOSE, CAPILLARY
Glucose-Capillary: 87 mg/dL (ref 70–99)
Glucose-Capillary: 129 mg/dL — ABNORMAL HIGH (ref 70–99)
Glucose-Capillary: 107 mg/dL — ABNORMAL HIGH (ref 70–99)
Glucose-Capillary: 170 mg/dL — ABNORMAL HIGH (ref 70–99)

## 2020-12-06 SURGERY — LEFT HEART CATH AND CORONARY ANGIOGRAPHY
Anesthesia: LOCAL

## 2020-12-06 MED ORDER — HEPARIN (PORCINE) IN NACL 1000-0.9 UT/500ML-% IV SOLN
INTRAVENOUS | Status: AC
  Filled 2020-12-06: qty 1000

## 2020-12-06 MED ORDER — SODIUM CHLORIDE 0.9% FLUSH: 3.0000 mL | INTRAVENOUS | Status: DC | PRN

## 2020-12-06 MED ORDER — HEPARIN (PORCINE) 25000 UT/250ML-% IV SOLN
900.0000 [IU]/h | INTRAVENOUS | Status: DC
  Administered 2020-12-06: 1000 [IU]/h via INTRAVENOUS
  Filled 2020-12-06: qty 250

## 2020-12-06 MED ORDER — MIDAZOLAM HCL 2 MG/2ML IJ SOLN
INTRAMUSCULAR | Status: DC | PRN
  Administered 2020-12-06: 1 mg via INTRAVENOUS

## 2020-12-06 MED ORDER — HYDRALAZINE HCL 20 MG/ML IJ SOLN: 10.0000 mg | INTRAMUSCULAR | Status: AC | PRN

## 2020-12-06 MED ORDER — INSULIN ASPART 100 UNIT/ML IJ SOLN: 0.0000 [IU] | INTRAMUSCULAR | Status: DC

## 2020-12-06 MED ORDER — FENTANYL CITRATE (PF) 100 MCG/2ML IJ SOLN
INTRAMUSCULAR | Status: AC
  Filled 2020-12-06: qty 2

## 2020-12-06 MED ORDER — NITROGLYCERIN 1 MG/10 ML FOR IR/CATH LAB
INTRA_ARTERIAL | Status: AC
  Filled 2020-12-06: qty 10

## 2020-12-06 MED ORDER — TIROFIBAN HCL IN NACL 5-0.9 MG/100ML-% IV SOLN
INTRAVENOUS | Status: AC
  Filled 2020-12-06: qty 100

## 2020-12-06 MED ORDER — TIROFIBAN (AGGRASTAT) BOLUS VIA INFUSION
INTRAVENOUS | Status: DC | PRN
Start: 2020-12-06 — End: 2020-12-06
  Administered 2020-12-06: 2132.5 ug via INTRAVENOUS

## 2020-12-06 MED ORDER — VERAPAMIL HCL 2.5 MG/ML IV SOLN
INTRAVENOUS | Status: DC | PRN
  Administered 2020-12-06: 10 mL via INTRA_ARTERIAL

## 2020-12-06 MED ORDER — ASPIRIN 81 MG PO CHEW
CHEWABLE_TABLET | ORAL | Status: AC
  Filled 2020-12-06: qty 1

## 2020-12-06 MED ORDER — ASPIRIN 81 MG PO CHEW
324.0000 mg | CHEWABLE_TABLET | Freq: Once | ORAL | Status: AC
  Administered 2020-12-06: 324 mg via ORAL
  Filled 2020-12-06: qty 4

## 2020-12-06 MED ORDER — NITROGLYCERIN 1 MG/10 ML FOR IR/CATH LAB
INTRA_ARTERIAL | Status: DC | PRN
  Administered 2020-12-06: 200 ug via INTRACORONARY

## 2020-12-06 MED ORDER — MIDAZOLAM HCL 2 MG/2ML IJ SOLN
INTRAMUSCULAR | Status: AC
  Filled 2020-12-06: qty 2

## 2020-12-06 MED ORDER — SODIUM CHLORIDE 0.9 % IV SOLN: 250.0000 mL | INTRAVENOUS | Status: DC | PRN

## 2020-12-06 MED ORDER — ROSUVASTATIN CALCIUM 20 MG PO TABS: 40.0000 mg | ORAL_TABLET | Freq: Every evening | ORAL | Status: DC

## 2020-12-06 MED ORDER — HEPARIN SODIUM (PORCINE) 1000 UNIT/ML IJ SOLN
INTRAMUSCULAR | Status: DC | PRN
  Administered 2020-12-06 (×2): 4500 [IU] via INTRAVENOUS
  Administered 2020-12-06: 2000 [IU] via INTRAVENOUS

## 2020-12-06 MED ORDER — TIROFIBAN HCL IN NACL 5-0.9 MG/100ML-% IV SOLN
INTRAVENOUS | Status: AC | PRN
  Administered 2020-12-06: 0.15 ug/kg/min via INTRAVENOUS

## 2020-12-06 MED ORDER — ROSUVASTATIN CALCIUM 20 MG PO TABS
40.0000 mg | ORAL_TABLET | Freq: Every day | ORAL | Status: DC
  Administered 2020-12-06 – 2020-12-07 (×2): 40 mg via ORAL
  Filled 2020-12-06 (×3): qty 2

## 2020-12-06 MED ORDER — HEPARIN BOLUS VIA INFUSION
4000.0000 [IU] | Freq: Once | INTRAVENOUS | Status: AC
  Administered 2020-12-06: 4000 [IU] via INTRAVENOUS

## 2020-12-06 MED ORDER — VERAPAMIL HCL 2.5 MG/ML IV SOLN
INTRAVENOUS | Status: DC | PRN
  Administered 2020-12-06: 200 ug via INTRACORONARY

## 2020-12-06 MED ORDER — TICAGRELOR 90 MG PO TABS
90.0000 mg | ORAL_TABLET | Freq: Two times a day (BID) | ORAL | 2 refills | Status: DC
  Filled 2020-12-06: qty 60, 30d supply, fill #0

## 2020-12-06 MED ORDER — PANTOPRAZOLE SODIUM 40 MG PO TBEC
40.0000 mg | DELAYED_RELEASE_TABLET | Freq: Every day | ORAL | Status: DC
  Administered 2020-12-06 – 2020-12-07 (×2): 40 mg via ORAL
  Filled 2020-12-06 (×2): qty 1

## 2020-12-06 MED ORDER — HEPARIN (PORCINE) IN NACL 1000-0.9 UT/500ML-% IV SOLN
INTRAVENOUS | Status: DC | PRN
  Administered 2020-12-06: 500 mL

## 2020-12-06 MED ORDER — HEPARIN SODIUM (PORCINE) 1000 UNIT/ML IJ SOLN
INTRAMUSCULAR | Status: AC
  Filled 2020-12-06: qty 1

## 2020-12-06 MED ORDER — DEXTROSE 50 % IV SOLN
1.0000 | Freq: Once | INTRAVENOUS | Status: AC
  Administered 2020-12-06: 25 mL via INTRAVENOUS

## 2020-12-06 MED ORDER — EMPAGLIFLOZIN 10 MG PO TABS
10.0000 mg | ORAL_TABLET | Freq: Every day | ORAL | Status: DC
  Filled 2020-12-06: qty 1

## 2020-12-06 MED ORDER — LIDOCAINE HCL (PF) 1 % IJ SOLN
INTRAMUSCULAR | Status: AC
  Filled 2020-12-06: qty 30

## 2020-12-06 MED ORDER — TICAGRELOR 90 MG PO TABS
ORAL_TABLET | ORAL | Status: DC | PRN
  Administered 2020-12-06: 180 mg via ORAL

## 2020-12-06 MED ORDER — INSULIN ASPART 100 UNIT/ML IJ SOLN
0.0000 [IU] | Freq: Three times a day (TID) | INTRAMUSCULAR | Status: DC
  Administered 2020-12-07: 2 [IU] via SUBCUTANEOUS

## 2020-12-06 MED ORDER — HEPARIN (PORCINE) IN NACL 1000-0.9 UT/500ML-% IV SOLN
INTRAVENOUS | Status: DC | PRN
Start: 2020-12-06 — End: 2020-12-06
  Administered 2020-12-06 (×3): 500 mL

## 2020-12-06 MED ORDER — NITROGLYCERIN 0.4 MG SL SUBL: 0.4000 mg | SUBLINGUAL_TABLET | SUBLINGUAL | Status: DC | PRN

## 2020-12-06 MED ORDER — LIDOCAINE HCL (PF) 1 % IJ SOLN
INTRAMUSCULAR | Status: DC | PRN
  Administered 2020-12-06: 2 mL

## 2020-12-06 MED ORDER — SODIUM CHLORIDE 0.9 % WEIGHT BASED INFUSION
1.0000 mL/kg/h | INTRAVENOUS | Status: DC
Start: 2020-12-07 — End: 2020-12-06

## 2020-12-06 MED ORDER — VERAPAMIL HCL 2.5 MG/ML IV SOLN
INTRAVENOUS | Status: AC
  Filled 2020-12-06: qty 2

## 2020-12-06 MED ORDER — ASPIRIN 81 MG PO CHEW
81.0000 mg | CHEWABLE_TABLET | ORAL | Status: AC
  Administered 2020-12-06: 81 mg via ORAL

## 2020-12-06 MED ORDER — TIROFIBAN HCL IN NACL 5-0.9 MG/100ML-% IV SOLN
0.1500 ug/kg/min | INTRAVENOUS | Status: DC
  Administered 2020-12-06 – 2020-12-07 (×3): 0.15 ug/kg/min via INTRAVENOUS
  Filled 2020-12-06 (×3): qty 100

## 2020-12-06 MED ORDER — ONDANSETRON HCL 4 MG/2ML IJ SOLN: 4.0000 mg | Freq: Four times a day (QID) | INTRAMUSCULAR | Status: DC | PRN

## 2020-12-06 MED ORDER — ACETAMINOPHEN 325 MG PO TABS: 650.0000 mg | ORAL_TABLET | ORAL | Status: DC | PRN

## 2020-12-06 MED ORDER — ASPIRIN EC 81 MG PO TBEC
81.0000 mg | DELAYED_RELEASE_TABLET | Freq: Every day | ORAL | Status: DC
  Administered 2020-12-07: 81 mg via ORAL
  Filled 2020-12-06: qty 1

## 2020-12-06 MED ORDER — IOHEXOL 350 MG/ML SOLN
INTRAVENOUS | Status: DC | PRN
  Administered 2020-12-06: 140 mL

## 2020-12-06 MED ORDER — ASPIRIN 81 MG PO TBEC
81.0000 mg | DELAYED_RELEASE_TABLET | Freq: Every day | ORAL | 2 refills | Status: DC
  Filled 2020-12-06: qty 30, 30d supply, fill #0

## 2020-12-06 MED ORDER — SODIUM CHLORIDE 0.9 % WEIGHT BASED INFUSION
3.0000 mL/kg/h | INTRAVENOUS | Status: DC
  Administered 2020-12-06: 3 mL/kg/h via INTRAVENOUS

## 2020-12-06 MED ORDER — INSULIN ASPART 100 UNIT/ML IJ SOLN: 0.0000 [IU] | Freq: Every day | INTRAMUSCULAR | Status: DC

## 2020-12-06 MED ORDER — DEXTROSE 50 % IV SOLN
INTRAVENOUS | Status: AC
  Filled 2020-12-06: qty 50

## 2020-12-06 MED ORDER — SODIUM CHLORIDE 0.9 % IV SOLN: INTRAVENOUS | Status: AC

## 2020-12-06 MED ORDER — TICAGRELOR 90 MG PO TABS
90.0000 mg | ORAL_TABLET | Freq: Two times a day (BID) | ORAL | Status: DC
  Administered 2020-12-07 (×2): 90 mg via ORAL
  Filled 2020-12-06 (×2): qty 1

## 2020-12-06 MED ORDER — SODIUM CHLORIDE 0.9% FLUSH
3.0000 mL | Freq: Two times a day (BID) | INTRAVENOUS | Status: DC
  Administered 2020-12-06 (×2): 3 mL via INTRAVENOUS

## 2020-12-06 MED ORDER — LABETALOL HCL 5 MG/ML IV SOLN: 10.0000 mg | INTRAVENOUS | Status: AC | PRN

## 2020-12-06 MED ORDER — METOPROLOL TARTRATE 50 MG PO TABS
50.0000 mg | ORAL_TABLET | Freq: Two times a day (BID) | ORAL | Status: DC
  Administered 2020-12-06 – 2020-12-07 (×3): 50 mg via ORAL
  Filled 2020-12-06 (×3): qty 1

## 2020-12-06 MED ORDER — LOSARTAN POTASSIUM 25 MG PO TABS
25.0000 mg | ORAL_TABLET | Freq: Every day | ORAL | Status: DC
  Administered 2020-12-07: 25 mg via ORAL
  Filled 2020-12-06: qty 1

## 2020-12-06 MED ORDER — SODIUM CHLORIDE 0.9% FLUSH: 3.0000 mL | Freq: Two times a day (BID) | INTRAVENOUS | Status: DC

## 2020-12-06 MED ORDER — TICAGRELOR 90 MG PO TABS
ORAL_TABLET | ORAL | Status: AC
  Filled 2020-12-06: qty 2

## 2020-12-06 MED ORDER — FENTANYL CITRATE (PF) 100 MCG/2ML IJ SOLN
INTRAMUSCULAR | Status: DC | PRN
  Administered 2020-12-06: 50 ug via INTRAVENOUS

## 2020-12-06 SURGICAL SUPPLY — 22 items
BALLN SAPPHIRE 3.0X15 (BALLOONS) ×2
BALLN SAPPHIRE ~~LOC~~ 3.25X8 (BALLOONS) ×2 IMPLANT
BALLN SAPPHIRE ~~LOC~~ 3.5X15 (BALLOONS) ×2 IMPLANT
BALLOON SAPPHIRE 3.0X15 (BALLOONS) ×1 IMPLANT
CATH LAUNCHER 6FR EBU 3 (CATHETERS) ×2 IMPLANT
CATH OPTICROSS HD (CATHETERS) ×2 IMPLANT
CATH OPTITORQUE TIG 4.0 5F (CATHETERS) ×2 IMPLANT
CATH PRIORITY ONE AC 6F (CATHETERS) ×2 IMPLANT
DEVICE RAD COMP TR BAND LRG (VASCULAR PRODUCTS) ×2 IMPLANT
GLIDESHEATH SLEND A-KIT 6F 22G (SHEATH) ×2 IMPLANT
GUIDEWIRE INQWIRE 1.5J.035X260 (WIRE) ×1 IMPLANT
INQWIRE 1.5J .035X260CM (WIRE) ×2
KIT ENCORE 26 ADVANTAGE (KITS) ×2 IMPLANT
KIT HEART LEFT (KITS) ×2 IMPLANT
KIT HEMO VALVE WATCHDOG (MISCELLANEOUS) ×2 IMPLANT
PACK CARDIAC CATHETERIZATION (CUSTOM PROCEDURE TRAY) ×2 IMPLANT
SLED PULL BACK IVUS (MISCELLANEOUS) ×2 IMPLANT
STENT ONYX FRONTIER 3.0X30 (Permanent Stent) ×2 IMPLANT
TRANSDUCER W/STOPCOCK (MISCELLANEOUS) ×2 IMPLANT
TUBING CIL FLEX 10 FLL-RA (TUBING) ×2 IMPLANT
WIRE ASAHI PROWATER 180CM (WIRE) ×2 IMPLANT
WIRE HI TORQ BMW 190CM (WIRE) ×2 IMPLANT

## 2020-12-06 NOTE — Progress Notes (Signed)
ANTICOAGULATION CONSULT NOTE - Initial Consult  Pharmacy Consult for Heparin Indication: chest pain/ACS  No Known Allergies  Patient Measurements: Height: '5\' 11"'$  (180.3 cm) Weight: 85.3 kg (188 lb) IBW/kg (Calculated) : 70.8  Vital Signs: Temp: 97.9 F (36.6 C) (08/11 2302) BP: 176/73 (08/12 0600) Pulse Rate: 79 (08/12 0600)  Labs: Recent Labs    12/05/20 2346 12/06/20 0139 12/06/20 0503 12/06/20 0746 12/06/20 0747  HGB 12.0  --   --   --   --   HCT 37.4  --   --   --   --   PLT 383  --   --   --   --   HEPARINUNFRC  --   --   --   --  0.76*  CREATININE 1.02*  --  0.85  --   --   TROPONINIHS 90* 149* 396* 734*  --      Estimated Creatinine Clearance: 86.2 mL/min (by C-G formula based on SCr of 0.85 mg/dL).   Medical History: Past Medical History:  Diagnosis Date   Abnormal mammogram    Allergic rhinitis due to pollen 08/01/2019   Arthritis 01/06/2019   osteoarthritis of both knees   BMI 28.0-28.9,adult 08/01/2019   Cystocele, unspecified (CODE)    Diabetes mellitus    GERD (gastroesophageal reflux disease)    Heart murmur    Hyperlipemia, mixed 12/14/2018   Hypertension    Hypothyroidism    Stress incontinence, female    Thyroid disease    Type 2 diabetes mellitus, without long-term current use of insulin ([place]) 01/06/2019   Vitamin D deficiency 01/06/2019    Medications:  No current facility-administered medications on file prior to encounter.   Current Outpatient Medications on File Prior to Encounter  Medication Sig Dispense Refill   Calcium Carbonate-Vitamin D (CALTRATE 600+D PO) Take 1 tablet by mouth daily.     cetirizine (ZYRTEC) 10 MG tablet Take 10 mg by mouth daily.     cholecalciferol (VITAMIN D3) 25 MCG (1000 UNIT) tablet Take 1,000 Units by mouth daily.     levothyroxine (SYNTHROID) 137 MCG tablet Take 137 mcg by mouth daily.     losartan (COZAAR) 50 MG tablet Take 50 mg by mouth daily.     magnesium oxide (MAG-OX) 400 MG tablet Take  400 mg by mouth daily.     metFORMIN (GLUCOPHAGE) 1000 MG tablet Take 1,000 mg by mouth 2 (two) times daily with a meal.     omeprazole (PRILOSEC) 20 MG capsule Take 20 mg by mouth daily.     TRESIBA FLEXTOUCH 200 UNIT/ML FlexTouch Pen Inject 45 Units into the skin daily.     vitamin B-12 (CYANOCOBALAMIN) 100 MCG tablet Take 100 mcg by mouth daily.     fluconazole (DIFLUCAN) 150 MG tablet Take 150 mg by mouth once. (Patient not taking: Reported on 12/06/2020)     [DISCONTINUED] Insulin Degludec (TRESIBA FLEXTOUCH [place]) Inject into the skin.     [DISCONTINUED] Insulin Syringe-Needle U-100 31G X 1/4" 0.3 ML MISC by Does not apply route as needed.     [DISCONTINUED] levothyroxine (SYNTHROID) 150 MCG tablet Take 150 mcg by mouth daily.      [DISCONTINUED] montelukast (SINGULAIR) 10 MG tablet Take 10 mg by mouth daily.     [DISCONTINUED] Omega-3 Fatty Acids (FISH OIL OMEGA-3) 1000 MG CAPS Take 1 capsule by mouth daily.     [DISCONTINUED] Semaglutide,0.25 or 0.'5MG'$ /DOS, 2 MG/1.5ML SOPN      [DISCONTINUED] SYNTHROID 150 MCG tablet Take 150  mcg by mouth daily.       Assessment: Pharmacy consulted to dose heparin in patient with chest pain/ACS.Marland Kitchen  She is not on anticoagulation prior to admission.  Plan is for cardiac cath [facility] later today.  HL 0.76- slightly supratherapeutic CBC WNL Trop 734  Goal of Therapy:  Heparin level 0.3-0.7 units/ml Monitor platelets by anticoagulation protocol: Yes   Plan:  Decrease heparin infusion to 900 units/hr. Heparin level in 6 hours and daily. Continue to monitor H&H and s/s of bleeding.  Margot Ables, PharmD Clinical Pharmacist 12/06/2020 9:03 AM

## 2020-12-06 NOTE — H&P (View-Only) (Signed)
CARDIOLOGY CONSULT NOTE  Patient ID: Monica Bell MRN: [id number] DOB/AGE: March 23, 1961 60 y.o.  Admit date: 12/05/2020 Referring Physician: Morrell Riddle Bell Reason for Consultation: Chest pain, troponin elevation  HPI:   60 year old African-American female with hypertension, hyperlipidemia, type 2 diabetes mellitus, tobacco dependence, admitted for chest pain.  Patient presented to [facility] emergency room earlier today with complaints of retrosternal chest pain radiating to her jaw lasting for several minutes.  Pain improved after administering nitroglycerin and Evenity.  Patient was hypertensive up to 200/90 mmHg, subsequently improved.  Work-up was significant for rising high-sensitivity troponins and 100s.  Patient was initially admitted under hospitalist service at [facility], and was then transferred to [facility] for further management of possible non-STEMI.  Patient is currently chest pain-free and appears comfortable.  Past Medical History:  Diagnosis Date   Abnormal mammogram    Allergic rhinitis due to pollen 08/01/2019   Arthritis 01/06/2019   osteoarthritis of both knees   BMI 28.0-28.9,adult 08/01/2019   Cystocele, unspecified (CODE)    Diabetes mellitus    GERD (gastroesophageal reflux disease)    Heart murmur    Hyperlipemia, mixed 12/14/2018   Hypertension    Hypothyroidism    Stress incontinence, female    Thyroid disease    Type 2 diabetes mellitus, without long-term current use of insulin (Monica Bell) 01/06/2019   Vitamin D deficiency 01/06/2019     Past Surgical History:  Procedure Laterality Date   COLONOSCOPY WITH PROPOFOL N/A 03/18/2020   Procedure: COLONOSCOPY WITH PROPOFOL;  Surgeon: Lesly Rubenstein, Bell;  Location: [facility] ENDOSCOPY;  Service: Endoscopy;  Laterality: N/A;   ROTATOR CUFF REPAIR     THYROIDECTOMY        Family History  Problem Relation Age of Onset   Diabetes Mother    Cancer Mother    COPD Father     Hypertension Father      Social History: Social History   Socioeconomic History   Marital status: Single    Spouse name: Not on file   Number of children: Not on file   Years of education: Not on file   Highest education level: Not on file  Occupational History   Not on file  Tobacco Use   Smoking status: Every Day    Packs/day: 0.50    Years: 15.00    Pack years: 7.50    Types: Cigarettes   Smokeless tobacco: Never  Vaping Use   Vaping Use: Never used  Substance and Sexual Activity   Alcohol use: No   Drug use: No   Sexual activity: Never  Other Topics Concern   Not on file  Social History Narrative   Not on file   Social Determinants of Health   Financial Resource Strain: Not on file  Food Insecurity: Not on file  Transportation Needs: Not on file  Physical Activity: Not on file  Stress: Not on file  Social Connections: Not on file  Intimate Partner Violence: Not on file     Medications Prior to Admission  Medication Sig Dispense Refill Last Dose   Calcium Carbonate-Vitamin D (CALTRATE 600+D PO) Take 1 tablet by mouth daily.   12/05/2020   cetirizine (ZYRTEC) 10 MG tablet Take 10 mg by mouth daily.   12/05/2020   cholecalciferol (VITAMIN D3) 25 MCG (1000 UNIT) tablet Take 1,000 Units by mouth daily.   12/05/2020   levothyroxine (SYNTHROID) 137 MCG tablet Take 137 mcg by mouth daily.   12/05/2020  losartan (COZAAR) 50 MG tablet Take 50 mg by mouth daily.   12/05/2020   magnesium oxide (MAG-OX) 400 MG tablet Take 400 mg by mouth daily.   12/05/2020   metFORMIN (GLUCOPHAGE) 1000 MG tablet Take 1,000 mg by mouth 2 (two) times daily with a meal.   12/05/2020   omeprazole (PRILOSEC) 20 MG capsule Take 20 mg by mouth daily.      TRESIBA FLEXTOUCH 200 UNIT/ML FlexTouch Pen Inject 45 Units into the skin daily.   12/05/2020   vitamin B-12 (CYANOCOBALAMIN) 100 MCG tablet Take 100 mcg by mouth daily.   12/05/2020   fluconazole (DIFLUCAN) 150 MG tablet Take 150 mg by mouth once.  (Patient not taking: Reported on 12/06/2020)   Not Taking    Review of Systems  Constitutional: Negative for decreased appetite, malaise/fatigue, weight gain and weight loss.  HENT:  Negative for congestion.   Eyes:  Negative for visual disturbance.  Cardiovascular:  Positive for chest pain. Negative for dyspnea on exertion, leg swelling, palpitations and syncope.  Respiratory:  Negative for cough.   Endocrine: Negative for cold intolerance.  Hematologic/Lymphatic: Does not bruise/bleed easily.  Skin:  Negative for itching and rash.  Musculoskeletal:  Negative for myalgias.  Gastrointestinal:  Negative for abdominal pain, nausea and vomiting.  Genitourinary:  Negative for dysuria.  Neurological:  Negative for dizziness and weakness.  Psychiatric/Behavioral:  The patient is not nervous/anxious.   All other systems reviewed and are negative.    Physical Exam: Physical Exam Vitals and nursing note reviewed.  Constitutional:      General: She is not in acute distress.    Appearance: She is well-developed.  HENT:     Head: Normocephalic and atraumatic.  Eyes:     Conjunctiva/sclera: Conjunctivae normal.     Pupils: Pupils are equal, round, and reactive to light.  Neck:     Vascular: No JVD.  Cardiovascular:     Rate and Rhythm: Normal rate and regular rhythm.     Pulses: Normal pulses and intact distal pulses.     Heart sounds: No murmur heard. Pulmonary:     Effort: Pulmonary effort is normal.     Breath sounds: Normal breath sounds. No wheezing or rales.  Abdominal:     General: Bowel sounds are normal.     Palpations: Abdomen is soft.     Tenderness: There is no rebound.  Musculoskeletal:        General: No tenderness. Normal range of motion.     Right lower leg: No edema.     Left lower leg: No edema.  Lymphadenopathy:     Cervical: No cervical adenopathy.  Skin:    General: Skin is warm and dry.  Neurological:     Mental Status: She is alert and oriented to person,  place, and time.     Cranial Nerves: No cranial nerve deficit.     Labs:   Lab Results  Component Value Date   WBC 13.2 (H) 12/05/2020   HGB 12.0 12/05/2020   HCT 37.4 12/05/2020   MCV 89.7 12/05/2020   PLT 383 12/05/2020    Recent Labs  Lab 12/06/20 0503  NA 140  K 3.7  CL 106  CO2 26  BUN 9  CREATININE 0.85  CALCIUM 8.7*  GLUCOSE 96    Lipid Panel     Component Value Date/Time   CHOL 201 (H) 12/06/2020 0503   TRIG 78 12/06/2020 0503   HDL 51 12/06/2020 0503   CHOLHDL  3.9 12/06/2020 0503   VLDL 16 12/06/2020 0503   LDLCALC 134 (H) 12/06/2020 0503    BNP (last 3 results) No results for input(s): BNP in the last 8760 hours.  HEMOGLOBIN A1C No results found for: HGBA1C, MPG  Cardiac Panel (last 3 results) No results for input(s): CKTOTAL, CKMB, RELINDX in the last 8760 hours.  Invalid input(s): TROPONINHS  No results found for: CKTOTAL, CKMB, CKMBINDEX   TSH No results for input(s): TSH in the last 8760 hours.    Radiology: [facility] Chest Port 1 View  Result Date: 12/05/2020 CLINICAL DATA:  Mid chest pain that radiates to right side in the neck. EXAM: PORTABLE CHEST 1 VIEW COMPARISON:  July 23, 2013 FINDINGS: The heart size and mediastinal contours are within normal limits. Aortic atherosclerosis. No focal consolidation. No pleural effusion. No pneumothorax. Degenerative change of the shoulders. Surgical anchors in the right humeral head. IMPRESSION: 1. No acute cardiopulmonary findings. 2.  Aortic Atherosclerosis (ICD10-I70.0). Electronically Signed   By: Dahlia Bailiff Bell   On: 12/05/2020 23:59   ECHOCARDIOGRAM COMPLETE  Result Date: 12/06/2020    ECHOCARDIOGRAM REPORT   Patient Name:   Monica Bell Date of Exam: 12/06/2020 Medical Rec #:  [id number]          Height:       71.0 in Accession #:    [id number]         Weight:       188.0 lb Date of Birth:  11-09-1960          BSA:          2.054 m Patient Age:    67 years           BP:           176/73 mmHg  Patient Gender: F                  HR:           79 bpm. Exam Location:  [facility] Procedure: 2D Echo, Cardiac Doppler and Color Doppler Indications:    Elevated Troponin  History:        Patient has no prior history of Echocardiogram examinations.                 Signs/Symptoms:Chest Pain; Risk Factors:Hypertension, Diabetes,                 Dyslipidemia and Current Smoker.  Sonographer:    Wenda Low Referring Phys: Mitzi Hansen Bell  FINDINGS  Mitral Valve: MV peak gradient, 6.6 mmHg. The mean mitral valve gradient is 2.0 mmHg. Aortic Valve: Aortic valve mean gradient measures 4.0 mmHg. Aortic valve peak gradient measures 7.7 mmHg. Aortic valve area, by VTI measures 1.77 cm.  LEFT VENTRICLE PLAX 2D LVIDd:         4.37 cm     Diastology LVIDs:         3.52 cm     LV e' medial:    5.52 cm/s LV PW:         1.06 cm     LV E/e' medial:  17.0 LV IVS:        0.93 cm     LV e' lateral:   6.05 cm/s LVOT diam:     1.90 cm     LV E/e' lateral: 15.5 LV SV:         60 LV SV Index:   29 LVOT Area:     2.84  cm  LV Volumes (MOD) LV vol d, MOD A2C: 68.8 ml LV vol d, MOD A4C: 77.4 ml LV vol s, MOD A2C: 28.2 ml LV vol s, MOD A4C: 33.6 ml LV SV MOD A2C:     40.6 ml LV SV MOD A4C:     77.4 ml LV SV MOD BP:      41.4 ml RIGHT VENTRICLE RV Basal diam:  3.59 cm RV Mid diam:    3.08 cm RV S prime:     14.50 cm/s TAPSE (M-mode): 1.8 cm LEFT ATRIUM             Index       RIGHT ATRIUM           Index LA diam:        3.20 cm 1.56 cm/m  RA Area:     12.40 cm LA Vol (A2C):   41.9 ml 20.40 ml/m RA Volume:   32.80 ml  15.97 ml/m LA Vol (A4C):   36.5 ml 17.77 ml/m LA Biplane Vol: 42.6 ml 20.74 ml/m  AORTIC VALVE AV Area (Vmax):    1.92 cm AV Area (Vmean):   1.67 cm AV Area (VTI):     1.77 cm AV Vmax:           139.00 cm/s AV Vmean:          96.000 cm/s AV VTI:            0.341 m AV Peak Grad:      7.7 mmHg AV Mean Grad:      4.0 mmHg LVOT Vmax:         94.00 cm/s LVOT Vmean:        56.700 cm/s LVOT VTI:          0.213 m LVOT/AV  VTI ratio: 0.62  AORTA Ao Root diam: 2.60 cm MITRAL VALVE MV Area (PHT): 4.33 cm     SHUNTS MV Area VTI:   1.62 cm     Systemic VTI:  0.21 m MV Peak grad:  6.6 mmHg     Systemic Diam: 1.90 cm MV Mean grad:  2.0 mmHg MV Vmax:       1.28 m/s MV Vmean:      72.4 cm/s MV Decel Time: 175 msec MV E velocity: 94.00 cm/s MV A velocity: 109.00 cm/s MV E/A ratio:  0.86  Electronically signed by Signature Date/Time: /    Preliminary     Scheduled Meds:  [MAR Hold] aspirin EC  81 mg Oral Daily   dextrose  1 ampule Intravenous Once   [MAR Hold] insulin aspart  0-9 Units Subcutaneous Q4H   [MAR Hold] metoprolol tartrate  50 mg Oral BID   [MAR Hold] pantoprazole  40 mg Oral Daily   [MAR Hold] rosuvastatin  40 mg Oral Daily   [MAR Hold] sodium chloride flush  3 mL Intravenous Q12H   Continuous Infusions:  [MAR Hold] sodium chloride     [START ON 12/07/2020] sodium chloride 3 mL/kg/hr (12/06/20 1211)   Followed by   Derrill Memo ON 12/07/2020] sodium chloride     heparin 900 Units/hr (12/06/20 0910)   PRN Meds:.[MAR Hold] sodium chloride, [MAR Hold] acetaminophen, [MAR Hold] nitroGLYCERIN, [MAR Hold] ondansetron (ZOFRAN) IV, [MAR Hold] sodium chloride flush  CARDIAC STUDIES:  EKG 12/06/2020: Sinus rhythm.   Old anteroseptal infarct.   Nonspecific ST-T changes lateral leads.  Echocardiogram: Pending  Assessment & Recommendations:  61 year old African-American female with hypertension, hyperlipidemia, type 2 diabetes mellitus, tobacco  dependence, admitted for chest pain.  Chest pain: Non-STEMI versus type II MI in the setting of hypertensive urgency.  Patient has risk factors, including hypertension, hyperlipidemia, type 2 diabetes mellitus, nicotine dependence.  Recommend aspirin, heparin, blood pressure control with metoprolol titrate 50 mg twice daily.  We will proceed with coronary angiogram and possible intervention.  Schedule for cardiac catheterization, and possible angioplasty. We discussed  regarding risks, benefits, alternatives to this including stress testing, CTA and continued medical therapy. Patient wants to proceed. Understands <1-2% risk of death, stroke, MI, urgent CABG, bleeding, infection, renal failure but not limited to these.      Nigel Mormon, Bell Pager: [phone] Office: [phone]

## 2020-12-06 NOTE — Consult Note (Signed)
CARDIOLOGY CONSULT NOTE  Patient ID: Monica Bell MRN: [id number] DOB/AGE: [birth year] 60 y.o.  Admit date: 12/05/2020 Referring Physician: Morrell Riddle MD Reason for Consultation: Chest pain, troponin elevation  HPI:   60 year old African-American female with hypertension, hyperlipidemia, type 2 diabetes mellitus, tobacco dependence, admitted for chest pain.  Patient presented to [facility] emergency room earlier today with complaints of retrosternal chest pain radiating to her jaw lasting for several minutes.  Pain improved after administering nitroglycerin and Evenity.  Patient was hypertensive up to 200/90 mmHg, subsequently improved.  Work-up was significant for rising high-sensitivity troponins and 100s.  Patient was initially admitted under hospitalist service at [facility], and was then transferred to [facility] for further management of possible non-STEMI.  Patient is currently chest pain-free and appears comfortable.  Past Medical History:  Diagnosis Date   Abnormal mammogram    Allergic rhinitis due to pollen 08/01/2019   Arthritis 01/06/2019   osteoarthritis of both knees   BMI 28.0-28.9,adult 08/01/2019   Cystocele, unspecified (CODE)    Diabetes mellitus    GERD (gastroesophageal reflux disease)    Heart murmur    Hyperlipemia, mixed 12/14/2018   Hypertension    Hypothyroidism    Stress incontinence, female    Thyroid disease    Type 2 diabetes mellitus, without long-term current use of insulin ([place]) 01/06/2019   Vitamin D deficiency 01/06/2019     Past Surgical History:  Procedure Laterality Date   COLONOSCOPY WITH PROPOFOL N/A 03/18/2020   Procedure: COLONOSCOPY WITH PROPOFOL;  Surgeon: Lesly Rubenstein, MD;  Location: [facility] ENDOSCOPY;  Service: Endoscopy;  Laterality: N/A;   ROTATOR CUFF REPAIR     THYROIDECTOMY        Family History  Problem Relation Age of Onset   Diabetes Mother    Cancer Mother    COPD Father     Hypertension Father      Social History: Social History   Socioeconomic History   Marital status: Single    Spouse name: Not on file   Number of children: Not on file   Years of education: Not on file   Highest education level: Not on file  Occupational History   Not on file  Tobacco Use   Smoking status: Every Day    Packs/day: 0.50    Years: 15.00    Pack years: 7.50    Types: Cigarettes   Smokeless tobacco: Never  Vaping Use   Vaping Use: Never used  Substance and Sexual Activity   Alcohol use: No   Drug use: No   Sexual activity: Never  Other Topics Concern   Not on file  Social History Narrative   Not on file   Social Determinants of Health   Financial Resource Strain: Not on file  Food Insecurity: Not on file  Transportation Needs: Not on file  Physical Activity: Not on file  Stress: Not on file  Social Connections: Not on file  Intimate Partner Violence: Not on file     Medications Prior to Admission  Medication Sig Dispense Refill Last Dose   Calcium Carbonate-Vitamin D (CALTRATE 600+D PO) Take 1 tablet by mouth daily.   12/05/2020   cetirizine (ZYRTEC) 10 MG tablet Take 10 mg by mouth daily.   12/05/2020   cholecalciferol (VITAMIN D3) 25 MCG (1000 UNIT) tablet Take 1,000 Units by mouth daily.   12/05/2020   levothyroxine (SYNTHROID) 137 MCG tablet Take 137 mcg by mouth daily.   12/05/2020  losartan (COZAAR) 50 MG tablet Take 50 mg by mouth daily.   12/05/2020   magnesium oxide (MAG-OX) 400 MG tablet Take 400 mg by mouth daily.   12/05/2020   metFORMIN (GLUCOPHAGE) 1000 MG tablet Take 1,000 mg by mouth 2 (two) times daily with a meal.   12/05/2020   omeprazole (PRILOSEC) 20 MG capsule Take 20 mg by mouth daily.      TRESIBA FLEXTOUCH 200 UNIT/ML FlexTouch Pen Inject 45 Units into the skin daily.   12/05/2020   vitamin B-12 (CYANOCOBALAMIN) 100 MCG tablet Take 100 mcg by mouth daily.   12/05/2020   fluconazole (DIFLUCAN) 150 MG tablet Take 150 mg by mouth once.  (Patient not taking: Reported on 12/06/2020)   Not Taking    Review of Systems  Constitutional: Negative for decreased appetite, malaise/fatigue, weight gain and weight loss.  HENT:  Negative for congestion.   Eyes:  Negative for visual disturbance.  Cardiovascular:  Positive for chest pain. Negative for dyspnea on exertion, leg swelling, palpitations and syncope.  Respiratory:  Negative for cough.   Endocrine: Negative for cold intolerance.  Hematologic/Lymphatic: Does not bruise/bleed easily.  Skin:  Negative for itching and rash.  Musculoskeletal:  Negative for myalgias.  Gastrointestinal:  Negative for abdominal pain, nausea and vomiting.  Genitourinary:  Negative for dysuria.  Neurological:  Negative for dizziness and weakness.  Psychiatric/Behavioral:  The patient is not nervous/anxious.   All other systems reviewed and are negative.    Physical Exam: Physical Exam Vitals and nursing note reviewed.  Constitutional:      General: She is not in acute distress.    Appearance: She is well-developed.  HENT:     Head: Normocephalic and atraumatic.  Eyes:     Conjunctiva/sclera: Conjunctivae normal.     Pupils: Pupils are equal, round, and reactive to light.  Neck:     Vascular: No JVD.  Cardiovascular:     Rate and Rhythm: Normal rate and regular rhythm.     Pulses: Normal pulses and intact distal pulses.     Heart sounds: No murmur heard. Pulmonary:     Effort: Pulmonary effort is normal.     Breath sounds: Normal breath sounds. No wheezing or rales.  Abdominal:     General: Bowel sounds are normal.     Palpations: Abdomen is soft.     Tenderness: There is no rebound.  Musculoskeletal:        General: No tenderness. Normal range of motion.     Right lower leg: No edema.     Left lower leg: No edema.  Lymphadenopathy:     Cervical: No cervical adenopathy.  Skin:    General: Skin is warm and dry.  Neurological:     Mental Status: She is alert and oriented to person,  place, and time.     Cranial Nerves: No cranial nerve deficit.     Labs:   Lab Results  Component Value Date   WBC 13.2 (H) 12/05/2020   HGB 12.0 12/05/2020   HCT 37.4 12/05/2020   MCV 89.7 12/05/2020   PLT 383 12/05/2020    Recent Labs  Lab 12/06/20 0503  NA 140  K 3.7  CL 106  CO2 26  BUN 9  CREATININE 0.85  CALCIUM 8.7*  GLUCOSE 96    Lipid Panel     Component Value Date/Time   CHOL 201 (H) 12/06/2020 0503   TRIG 78 12/06/2020 0503   HDL 51 12/06/2020 0503   CHOLHDL  3.9 12/06/2020 0503   VLDL 16 12/06/2020 0503   LDLCALC 134 (H) 12/06/2020 0503    BNP (last 3 results) No results for input(s): BNP in the last 8760 hours.  HEMOGLOBIN A1C No results found for: HGBA1C, MPG  Cardiac Panel (last 3 results) No results for input(s): CKTOTAL, CKMB, RELINDX in the last 8760 hours.  Invalid input(s): TROPONINHS  No results found for: CKTOTAL, CKMB, CKMBINDEX   TSH No results for input(s): TSH in the last 8760 hours.    Radiology: [facility] Chest Port 1 View  Result Date: 12/05/2020 CLINICAL DATA:  Mid chest pain that radiates to right side in the neck. EXAM: PORTABLE CHEST 1 VIEW COMPARISON:  July 23, 2013 FINDINGS: The heart size and mediastinal contours are within normal limits. Aortic atherosclerosis. No focal consolidation. No pleural effusion. No pneumothorax. Degenerative change of the shoulders. Surgical anchors in the right humeral head. IMPRESSION: 1. No acute cardiopulmonary findings. 2.  Aortic Atherosclerosis (ICD10-I70.0). Electronically Signed   By: Dahlia Bailiff MD   On: 12/05/2020 23:59   ECHOCARDIOGRAM COMPLETE  Result Date: 12/06/2020    ECHOCARDIOGRAM REPORT   Patient Name:   Monica Bell Date of Exam: 12/06/2020 Medical Rec #:  [id number]          Height:       71.0 in Accession #:    [id number]         Weight:       188.0 lb Date of Birth:  Jan 04, 1961          BSA:          2.054 m Patient Age:    37 years           BP:           176/73 mmHg  Patient Gender: F                  HR:           79 bpm. [facility]am Location[facility]  [facility] Procedure: 2D Echo, Cardiac Doppler and Color Doppler Indications:    Elevated Troponin  History:        Patient has no prior history of Echocardiogram examinations.                 Signs/Symptoms:Chest Pain; Risk Factors:Hypertension, Diabetes,                 Dyslipidemia and Current Smoker.  Sonographer:    Wenda Low Referring Phys: Mitzi Hansen MD  FINDINGS  Mitral Valve: MV peak gradient, 6.6 mmHg. The mean mitral valve gradient is 2.0 mmHg. Aortic Valve: Aortic valve mean gradient measures 4.0 mmHg. Aortic valve peak gradient measures 7.7 mmHg. Aortic valve area, by VTI measures 1.77 cm.  LEFT VENTRICLE PLAX 2D LVIDd:         4.37 cm     Diastology LVIDs:         3.52 cm     LV e' medial:    5.52 cm/s LV PW:         1.06 cm     LV E/e' medial:  17.0 LV IVS:        0.93 cm     LV e' lateral:   6.05 cm/s LVOT diam:     1.90 cm     LV E/e' lateral: 15.5 LV SV:         60 LV SV Index:   29 LVOT Area:     2.84  cm  LV Volumes (MOD) LV vol d, MOD A2C: 68.8 ml LV vol d, MOD A4C: 77.4 ml LV vol s, MOD A2C: 28.2 ml LV vol s, MOD A4C: 33.6 ml LV SV MOD A2C:     40.6 ml LV SV MOD A4C:     77.4 ml LV SV MOD BP:      41.4 ml RIGHT VENTRICLE RV Basal diam:  3.59 cm RV Mid diam:    3.08 cm RV S prime:     14.50 cm/s TAPSE (M-mode): 1.8 cm LEFT ATRIUM             Index       RIGHT ATRIUM           Index LA diam:        3.20 cm 1.56 cm/m  RA Area:     12.40 cm LA Vol (A2C):   41.9 ml 20.40 ml/m RA Volume:   32.80 ml  15.97 ml/m LA Vol (A4C):   36.5 ml 17.77 ml/m LA Biplane Vol: 42.6 ml 20.74 ml/m  AORTIC VALVE AV Area (Vmax):    1.92 cm AV Area (Vmean):   1.67 cm AV Area (VTI):     1.77 cm AV Vmax:           139.00 cm/s AV Vmean:          96.000 cm/s AV VTI:            0.341 m AV Peak Grad:      7.7 mmHg AV Mean Grad:      4.0 mmHg LVOT Vmax:         94.00 cm/s LVOT Vmean:        56.700 cm/s LVOT VTI:          0.213 m LVOT/AV  VTI ratio: 0.62  AORTA Ao Root diam: 2.60 cm MITRAL VALVE MV Area (PHT): 4.33 cm     SHUNTS MV Area VTI:   1.62 cm     Systemic VTI:  0.21 m MV Peak grad:  6.6 mmHg     Systemic Diam: 1.90 cm MV Mean grad:  2.0 mmHg MV Vmax:       1.28 m/s MV Vmean:      72.4 cm/s MV Decel Time: 175 msec MV E velocity: 94.00 cm/s MV A velocity: 109.00 cm/s MV E/A ratio:  0.86  Electronically signed by Signature Date/Time: /    Preliminary     Scheduled Meds:  [MAR Hold] aspirin EC  81 mg Oral Daily   dextrose  1 ampule Intravenous Once   [MAR Hold] insulin aspart  0-9 Units Subcutaneous Q4H   [MAR Hold] metoprolol tartrate  50 mg Oral BID   [MAR Hold] pantoprazole  40 mg Oral Daily   [MAR Hold] rosuvastatin  40 mg Oral Daily   [MAR Hold] sodium chloride flush  3 mL Intravenous Q12H   Continuous Infusions:  [MAR Hold] sodium chloride     [START ON 12/07/2020] sodium chloride 3 mL/kg/hr (12/06/20 1211)   Followed by   Derrill Memo ON 12/07/2020] sodium chloride     heparin 900 Units/hr (12/06/20 0910)   PRN Meds:.[MAR Hold] sodium chloride, [MAR Hold] acetaminophen, [MAR Hold] nitroGLYCERIN, [MAR Hold] ondansetron (ZOFRAN) IV, [MAR Hold] sodium chloride flush  CARDIAC STUDIES:  EKG 12/06/2020: Sinus rhythm.   Old anteroseptal infarct.   Nonspecific ST-T changes lateral leads.  Echocardiogram: Pending  Assessment & Recommendations:  61 year old African-American female with hypertension, hyperlipidemia, type 2 diabetes mellitus, tobacco  dependence, admitted for chest pain.  Chest pain: Non-STEMI versus type II MI in the setting of hypertensive urgency.  Patient has risk factors, including hypertension, hyperlipidemia, type 2 diabetes mellitus, nicotine dependence.  Recommend aspirin, heparin, blood pressure control with metoprolol titrate 50 mg twice daily.  We will proceed with coronary angiogram and possible intervention.  Schedule for cardiac catheterization, and possible angioplasty. We discussed  regarding risks, benefits, alternatives to this including stress testing, CTA and continued medical therapy. Patient wants to proceed. Understands <1-2% risk of death, stroke, MI, urgent CABG, bleeding, infection, renal failure but not limited to these.      Nigel Mormon, MD Pager: [phone] Office: [phone]

## 2020-12-06 NOTE — H&P (Signed)
History and Physical    Monica Bell [id number] DOB: June 24, 1960 DOA: 12/05/2020  PCP: [name], NP   Patient coming from: Home   Chief Complaint: Chest pain   HPI: Monica Bell is a 60 y.o. female with medical history significant for insulin-dependent diabetes mellitus, hypothyroidism, and GERD, now presenting to the emergency department for evaluation of chest pain.  Patient reports that she had a burning discomfort in her chest yesterday morning that resolved spontaneously.  She then had recurrent episode that was more severe and persistent shortly after eating dinner and while she was preparing for bed.  She describes a burning sensation radiating from mid chest up into her throat and to the left jaw.  She had similar symptoms few weeks ago that she attributed to her acid reflux.  There was no associated shortness of breath or nausea but she reports some sweating.  She denies any lower extremity swelling or tenderness.  Denies cough.  She smokes a half a pack per day.  Denies any family history of heart disease.  ED Course: Upon arrival to the ED, patient is found to be afebrile, saturating well on room air, and hypertensive to 114/99.  EKG features sinus rhythm with repolarization abnormality.  Chest x-ray negative for acute cardiopulmonary disease.  Chemistry panel with glucose 218 and creatinine 1.02.  CBC with leukocytosis to 13,200.  High-sensitivity troponin is 90.  Patient was given 324 mg of aspirin, GI cocktail, and started on IV heparin in the ED.  Review of Systems:  All other systems reviewed and apart from HPI, are negative.  Past Medical History:  Diagnosis Date   Abnormal mammogram    Allergic rhinitis due to pollen 08/01/2019   Arthritis 01/06/2019   osteoarthritis of both knees   BMI 28.0-28.9,adult 08/01/2019   Cystocele, unspecified (CODE)    Diabetes mellitus    GERD (gastroesophageal reflux disease)    Heart murmur    Hyperlipemia, mixed  12/14/2018   Hypertension    Hypothyroidism    Stress incontinence, female    Thyroid disease    Type 2 diabetes mellitus, without long-term current use of insulin ([place]) 01/06/2019   Vitamin D deficiency 01/06/2019    Past Surgical History:  Procedure Laterality Date   COLONOSCOPY WITH PROPOFOL N/A 03/18/2020   Procedure: COLONOSCOPY WITH PROPOFOL;  Surgeon: Lesly Rubenstein, MD;  Location: [facility] ENDOSCOPY;  Service: Endoscopy;  Laterality: N/A;   ROTATOR CUFF REPAIR     THYROIDECTOMY      Social History:   reports that she has been smoking cigarettes. She has a 7.50 pack-year smoking history. She has never used smokeless tobacco. She reports that she does not drink alcohol and does not use drugs.  No Known Allergies  Family History  Problem Relation Age of Onset   Diabetes Mother    Cancer Mother    COPD Father    Hypertension Father      Prior to Admission medications   Medication Sig Start Date End Date Taking? Authorizing Provider  Calcium Carbonate-Vitamin D (CALTRATE 600+D PO) Take 1 tablet by mouth daily.    [provider]  cetirizine (ZYRTEC) 10 MG tablet Take 10 mg by mouth daily.    [provider]  fluconazole (DIFLUCAN) 150 MG tablet Take 150 mg by mouth once.    [provider]  Insulin Degludec (TRESIBA FLEXTOUCH [place]) Inject into the skin.    [provider]  Insulin Syringe-Needle U-100 31G X 1/4" 0.3  ML MISC by Does not apply route as needed.    [provider]  levothyroxine (SYNTHROID) 150 MCG tablet Take 150 mcg by mouth daily.  08/05/19   [provider]  losartan (COZAAR) 50 MG tablet Take 50 mg by mouth daily.    [provider]  magnesium oxide (MAG-OX) 400 MG tablet Take 400 mg by mouth daily.    [provider]  metFORMIN (GLUCOPHAGE) 1000 MG tablet Take 1,000 mg by mouth 2 (two) times daily with a meal.    [provider]  montelukast (SINGULAIR) 10 MG tablet Take 10  mg by mouth daily. 08/01/19   [provider]  Omega-3 Fatty Acids (FISH OIL OMEGA-3) 1000 MG CAPS Take 1 capsule by mouth daily.    [provider]  omeprazole (PRILOSEC) 20 MG capsule Take 20 mg by mouth daily. 10/13/19   [provider]  Semaglutide,0.25 or 0.'5MG'$ /DOS, 2 MG/1.5ML SOPN  02/01/18   [provider]  SYNTHROID 150 MCG tablet Take 150 mcg by mouth daily. 05/16/13   [provider]    Physical Exam: Vitals:   12/06/20 0030 12/06/20 0100 12/06/20 0130 12/06/20 0251  BP: (!) 174/78 (!) 210/96 (!) 186/94 129/76  Pulse: 71 79 82 73  Resp: '19 18 17 16  '$ Temp:      SpO2: 100% 100% 100% 100%  Weight:      Height:        Constitutional: NAD, calm  Eyes: PERTLA, lids and conjunctivae normal ENMT: Mucous membranes are moist. Posterior pharynx clear of any exudate or lesions.   Neck: supple, no masses  Respiratory: no wheezing, no crackles. No accessory muscle use.  Cardiovascular: S1 & S2 heard, regular rate and rhythm. No extremity edema.   Abdomen: No distension, no tenderness, soft. Bowel sounds active.  Musculoskeletal: no clubbing / cyanosis. No joint deformity upper and lower extremities.   Skin: no significant rashes, lesions, ulcers. Warm, dry, well-perfused. Neurologic: CN 2-12 grossly intact. Sensation intact. Moving all extremitiess.  Psychiatric: Alert and oriented to person, place, and situation. Pleasant and cooperative.    Labs and Imaging on Admission: I have personally reviewed following labs and imaging studies  CBC: Recent Labs  Lab 12/05/20 2346  WBC 13.2*  NEUTROABS 6.7  HGB 12.0  HCT 37.4  MCV 89.7  PLT [id number]   Basic Metabolic Panel: Recent Labs  Lab 12/05/20 2346  NA 137  K 3.9  CL 105  CO2 26  GLUCOSE 218*  BUN 9  CREATININE 1.02*  CALCIUM 8.4*   GFR: Estimated Creatinine Clearance: 71.8 mL/min (A) (by C-G formula based on SCr of 1.02 mg/dL (H)). Liver Function Tests: No results for input(s):  AST, ALT, ALKPHOS, BILITOT, PROT, ALBUMIN in the last 168 hours. No results for input(s): LIPASE, AMYLASE in the last 168 hours. No results for input(s): AMMONIA in the last 168 hours. Coagulation Profile: No results for input(s): INR, PROTIME in the last 168 hours. Cardiac Enzymes: No results for input(s): CKTOTAL, CKMB, CKMBINDEX, TROPONINI in the last 168 hours. BNP (last 3 results) No results for input(s): PROBNP in the last 8760 hours. HbA1C: No results for input(s): HGBA1C in the last 72 hours. CBG: No results for input(s): GLUCAP in the last 168 hours. Lipid Profile: No results for input(s): CHOL, HDL, LDLCALC, TRIG, CHOLHDL, LDLDIRECT in the last 72 hours. Thyroid Function Tests: No results for input(s): TSH, T4TOTAL, FREET4, T3FREE, THYROIDAB in the last 72 hours. Anemia Panel: No results for input(s):  VITAMINB12, FOLATE, FERRITIN, TIBC, IRON, RETICCTPCT in the last 72 hours. Urine analysis:    Component Value Date/Time   COLORURINE YELLOW 03/14/2017 1704   APPEARANCEUR CLEAR 03/14/2017 1704   LABSPEC 1.020 03/14/2017 1704   PHURINE 5.0 03/14/2017 1704   GLUCOSEU NEGATIVE 03/14/2017 1704   HGBUR NEGATIVE 03/14/2017 1704   BILIRUBINUR NEGATIVE 03/14/2017 1704   KETONESUR NEGATIVE 03/14/2017 1704   PROTEINUR NEGATIVE 03/14/2017 1704   NITRITE NEGATIVE 03/14/2017 1704   LEUKOCYTESUR NEGATIVE 03/14/2017 1704   Sepsis Labs: '@LABRCNTIP'$ (procalcitonin:4,lacticidven:4) ) Recent Results (from the past 240 hour(s))  Resp Panel by RT-PCR (Flu A&B, Covid) Nasopharyngeal Swab     Status: None   Collection Time: 12/06/20  1:32 AM   Specimen: Nasopharyngeal Swab; Nasopharyngeal(NP) swabs in vial transport medium  Result Value Ref Range Status   SARS Coronavirus 2 by RT PCR NEGATIVE NEGATIVE Final    Comment: (NOTE) SARS-CoV-2 target nucleic acids are NOT DETECTED.  The SARS-CoV-2 RNA is generally detectable in upper respiratory specimens during the acute phase of infection.  The lowest concentration of SARS-CoV-2 viral copies this assay can detect is 138 copies/mL. A negative result does not preclude SARS-Cov-2 infection and should not be used as the sole basis for treatment or other patient management decisions. A negative result may occur with  improper specimen collection/handling, submission of specimen other than nasopharyngeal swab, presence of viral mutation(s) within the areas targeted by this assay, and inadequate number of viral copies(<138 copies/mL). A negative result must be combined with clinical observations, patient history, and epidemiological information. The expected result is Negative.  Fact Sheet for Patients:  EntrepreneurPulse.com.au  Fact Sheet for Healthcare Providers:  IncredibleEmployment.be  This test is no t yet approved or cleared by the [place] FDA and  has been authorized for detection and/or diagnosis of SARS-CoV-2 by FDA under an Emergency Use Authorization (EUA). This EUA will remain  in effect (meaning this test can be used) for the duration of the COVID-19 declaration under Section 564(b)(1) of the Act, 21 U.S.C.section 360bbb-3(b)(1), unless the authorization is terminated  or revoked sooner.       Influenza A by PCR NEGATIVE NEGATIVE Final   Influenza B by PCR NEGATIVE NEGATIVE Final    Comment: (NOTE) The Xpert Xpress SARS-CoV-2/FLU/RSV plus assay is intended as an aid in the diagnosis of influenza from Nasopharyngeal swab specimens and should not be used as a sole basis for treatment. Nasal washings and aspirates are unacceptable for Xpert Xpress SARS-CoV-2/FLU/RSV testing.  Fact Sheet for Patients: EntrepreneurPulse.com.au  Fact Sheet for Healthcare Providers: IncredibleEmployment.be  This test is not yet approved or cleared by the [place] FDA and has been authorized for detection and/or diagnosis of SARS-CoV-2 by FDA  under an Emergency Use Authorization (EUA). This EUA will remain in effect (meaning this test can be used) for the duration of the COVID-19 declaration under Section 564(b)(1) of the Act, 21 U.S.C. section 360bbb-3(b)(1), unless the authorization is terminated or revoked.  Performed at [facility], [address], [place], [place] [address]      Radiological Exams on Admission: DG Chest Port 1 View  Result Date: 12/05/2020 CLINICAL DATA:  Mid chest pain that radiates to right side in the neck. EXAM: PORTABLE CHEST 1 VIEW COMPARISON:  July 23, 2013 FINDINGS: The heart size and mediastinal contours are within normal limits. Aortic atherosclerosis. No focal consolidation. No pleural effusion. No pneumothorax. Degenerative change of the shoulders. Surgical anchors in the right humeral head. IMPRESSION: 1. No  acute cardiopulmonary findings. 2.  Aortic Atherosclerosis (ICD10-I70.0). Electronically Signed   By: Dahlia Bailiff MD   On: 12/05/2020 23:59    EKG: Independently reviewed. Sinus rhythm, borderline repolarization abnormality.   Assessment/Plan   1. Chest pain  - Presents with burning chest discomfort, had BP 214/99, partial relief with GI cocktail, initial HS trop 90, and EKG with borderline repolarization abnormality  - She was given ASA 324 mg and started on IV heparin in ED  - Pain resolved in ED and BP normalized without treatment  - Continue cardiac monitoring, trend troponin, repeat EKG, check echocardiogram, control BP, consider cardiology consult in am    2. Hypertensive crisis  - BP as high as 214/100 in ED, normalized without treatment  - Monitor closely, start metoprolol if elevates again    3. Mild renal insufficiency  - SCr is 1.02 on admission, up from 0.8 in 2015 - Hold losartan given unclear baseline/possible AKI, check UA with microscopy and albumin:creatinine ratio    4. Insulin-dependent DM  - No recent A1c in EMR  - Continue CBGs and insulin     DVT  prophylaxis: IV heparin  Code Status: Full  Level of Care: Level of care: Telemetry Cardiac Family Communication: Daughter updated by phone  Disposition Plan:  Patient is from: Home  Anticipated d/c is to: Home  Anticipated d/c date is: 8/13 or 12/08/20  Patient currently: Pending repeat troponin and EKG, echocardiogram  Consults called: none  Admission status: Observation     Vianne Bulls, MD [facility]  12/06/2020, 3:32 AM

## 2020-12-06 NOTE — Progress Notes (Signed)
Pt arrived to [place], cath lab holding with IV heparin infusing at 900 units/hr, discontinued at 1210, safety maintained, Dr. Virgina Jock aware of pt increased BP, no new orders given at this time

## 2020-12-06 NOTE — Progress Notes (Signed)
    Cardiology consult was placed for this patient at [facility]. Upon reviewing the chart, she is already scheduled for a cardiac catheterization with Dr. Virgina Jock later today as he was contacted by the overnight Hospitalist. I communicated with him and he will see once at [facility]. [facility] will sign off.   Signed, Erma Heritage, PA-C 12/06/2020, 8:25 AM

## 2020-12-06 NOTE — ED Notes (Signed)
Date and time results received: 12/06/20 0255  Test: Trop Critical Value: 149   Name of Provider Notified: Myna Hidalgo, MD

## 2020-12-06 NOTE — Interval H&P Note (Signed)
History and Physical Interval Note:  12/06/2020 2:19 PM  Monica Bell  has presented today for surgery, with the diagnosis of nstemi.  The various methods of treatment have been discussed with the patient and family. After consideration of risks, benefits and other options for treatment, the patient has consented to  Procedure(s): LEFT HEART CATH AND CORONARY ANGIOGRAPHY (N/A) as a surgical intervention.  The patient's history has been reviewed, patient examined, no change in status, stable for surgery.  I have reviewed the patient's chart and labs.  Questions were answered to the patient's satisfaction.    2016 Appropriate Use Criteria for Coronary Revascularization in Patients With Acute Coronary Syndrome NSTEMI/Unstable angina, stabilized patient at high risk Indication:  Revascularization by PCI or CABG of 1 or more arteries in a patient with NSTEMI or unstable angina with Stabilization after presentation High risk for clinical events A (7) Indication: 16; Score 7 [place]

## 2020-12-06 NOTE — Progress Notes (Signed)
ANTICOAGULATION CONSULT NOTE - Initial Consult  Pharmacy Consult for Heparin Indication: chest pain/ACS  No Known Allergies  Patient Measurements: Height: '5\' 11"'$  (180.3 cm) Weight: 85.3 kg (188 lb) IBW/kg (Calculated) : 70.8  Vital Signs: Temp: 97.9 F (36.6 C) (08/11 2302) BP: 174/78 (08/12 0030) Pulse Rate: 71 (08/12 0030)  Labs: Recent Labs    12/05/20 2346  HGB 12.0  HCT 37.4  PLT 383  CREATININE 1.02*  TROPONINIHS 90*    Estimated Creatinine Clearance: 71.8 mL/min (A) (by C-G formula based on SCr of 1.02 mg/dL (H)).   Medical History: Past Medical History:  Diagnosis Date   Abnormal mammogram    Allergic rhinitis due to pollen 08/01/2019   Arthritis 01/06/2019   osteoarthritis of both knees   BMI 28.0-28.9,adult 08/01/2019   Cystocele, unspecified (CODE)    Diabetes mellitus    GERD (gastroesophageal reflux disease)    Heart murmur    Hyperlipemia, mixed 12/14/2018   Hypertension    Hypothyroidism    Stress incontinence, female    Thyroid disease    Type 2 diabetes mellitus, without long-term current use of insulin ([place]) 01/06/2019   Vitamin D deficiency 01/06/2019    Medications:  No current facility-administered medications on file prior to encounter.   Current Outpatient Medications on File Prior to Encounter  Medication Sig Dispense Refill   Calcium Carbonate-Vitamin D (CALTRATE 600+D PO) Take 1 tablet by mouth daily.     cetirizine (ZYRTEC) 10 MG tablet Take 10 mg by mouth daily.     fluconazole (DIFLUCAN) 150 MG tablet Take 150 mg by mouth once.     Insulin Degludec (TRESIBA FLEXTOUCH ) Inject into the skin.     Insulin Syringe-Needle U-100 31G X 1/4" 0.3 ML MISC by Does not apply route as needed.     levothyroxine (SYNTHROID) 150 MCG tablet Take 150 mcg by mouth daily.      losartan (COZAAR) 50 MG tablet Take 50 mg by mouth daily.     magnesium oxide (MAG-OX) 400 MG tablet Take 400 mg by mouth daily.     metFORMIN (GLUCOPHAGE) 1000 MG  tablet Take 1,000 mg by mouth 2 (two) times daily with a meal.     montelukast (SINGULAIR) 10 MG tablet Take 10 mg by mouth daily.     Omega-3 Fatty Acids (FISH OIL OMEGA-3) 1000 MG CAPS Take 1 capsule by mouth daily.     omeprazole (PRILOSEC) 20 MG capsule Take 20 mg by mouth daily.     Semaglutide,0.25 or 0.'5MG'$ /DOS, 2 MG/1.5ML SOPN      SYNTHROID 150 MCG tablet Take 150 mcg by mouth daily.       Assessment: 60 y.o. female with chest pain for heparin  Goal of Therapy:  Heparin level 0.3-0.7 units/ml Monitor platelets by anticoagulation protocol: Yes   Plan:  Heparin 4000 units IV bolus, then start heparin 1000 units/hr Check heparin level in 6 hours.   Monica Bell 12/06/2020,1:03 AM

## 2020-12-06 NOTE — Progress Notes (Signed)
PROGRESS NOTE    Patient: Monica Bell                            PCP: [name], NP                    DOB: 09-17-1960            DOA: 12/05/2020 [id number]             DOS: 12/06/2020, 11:31 AM   LOS: 0 days   Date of Service: The patient was seen and examined on 12/06/2020  Subjective:   The patient was seen and examined this morning. Stable at this time. Still was complaining of chest pain earlier this morning, subsequently improved. On heparin drip, N.p.o.  Pending transfer to [facility] for cardiac catheterization  Brief Narrative:   Monica Bell is a 60 y.o. female with medical history significant for insulin-dependent diabetes mellitus, hypothyroidism, and GERD, now presenting to the emergency department for evaluation of chest pain.  Patient reports that she had a burning discomfort in her chest yesterday morning that resolved spontaneously.  She then had recurrent episode that was more severe and persistent shortly after eating dinner and while she was preparing for bed.  She describes a burning sensation radiating from mid chest up into her throat and to the left jaw.  She had similar symptoms few weeks ago that she attributed to her acid reflux.  There was no associated shortness of breath or nausea but she reports some sweating.     ED Course: Upon arrival to the ED, patient is found to be afebrile, saturating well on room air, and hypertensive to 114/99.  EKG features sinus rhythm with repolarization abnormality.  Chest x-ray negative for acute cardiopulmonary disease.  Chemistry panel with glucose 218 and creatinine 1.02.  CBC with leukocytosis to 13,200.  High-sensitivity troponin is 90.  Patient was given 324 mg of aspirin, GI cocktail, and started on IV heparin in the ED.   Patient blood pressure improved, troponin continues to elevate, with subtle changes in EKG Cardiologist at [facility] was consulted patient remain n.p.o. on heparin drip... To  be transferred for immediate cardiac catheterization  Assessment & Plan:   Principal Problem:   Chest pain Active Problems:   Diabetes mellitus type 2, insulin dependent (HCC)   GERD (gastroesophageal reflux disease)   Hypothyroidism   Mild renal insufficiency   Hypertensive crisis   1.  Non-STEMI - chest pain -Troponin 396, 734, 1050 now  -Continue to have chest pain this morning, improved with pain medication -N.p.o. -Patient was given aspirin earlier, started on heparin drip -Accelerated BP, responded to metoprolol--BP improved - Cardiology at [place] was consulted -patient be transferred for immediate cardiac catheterization -Requested p.o. metoprolol and statin to be initiated immediately -2D echocardiogram completed at [facility]: Pending reading  2. Hypertensive crisis  - BP as high as 214/100 in ED, normalized without treatment  - Monitor closely, start metoprolol if elevates again   -BP remained stable this morning 162/92   3. Mild renal insufficiency  - SCr is 1.02 on admission, up from 0.8 in 2015 - Hold losartan given unclear baseline/possible AKI, check UA with microscopy and albumin:creatinine ratio     4. Insulin-dependent DM  - No recent A1c in EMR  - Continue CBGs and insulin       ---------------------------------------------------------------------------------------------------------------------------------------------- Cultures; None  Antimicrobials: None  Consultants:  Cardiology at [place]   ------------------------------------------------------------------------------------------------------------------------------------------------  DVT prophylaxis:  Heparin drip Code Status:   Code Status: Full Code  Family Communication: Alert and oriented x3, discussed with patient The above findings and plan of care has been discussed with patient (and family)  in detail,  they expressed understanding and agreement of  above. -Advance care planning has been discussed.   Admission status:   Status is: Observation  The patient remains OBS appropriate and will d/c before 2 midnights.  Dispo: The patient is from: Home              Anticipated d/c is to: Home              Patient currently is not medically stable to d/c.   Difficult to place patient No      Level of care: Telemetry Cardiac   Procedures:   No admission procedures for hospital encounter.    Antimicrobials:  Anti-infectives (From admission, onward)    None        Medication:   [MAR Hold] aspirin EC  81 mg Oral Daily   [MAR Hold] insulin aspart  0-9 Units Subcutaneous Q4H   [MAR Hold] metoprolol tartrate  50 mg Oral BID   [MAR Hold] pantoprazole  40 mg Oral Daily   [MAR Hold] rosuvastatin  40 mg Oral Daily   [MAR Hold] sodium chloride flush  3 mL Intravenous Q12H    [MAR Hold] sodium chloride, [MAR Hold] acetaminophen, [MAR Hold] nitroGLYCERIN, [MAR Hold] ondansetron (ZOFRAN) IV, [MAR Hold] sodium chloride flush   Objective:   Vitals:   12/06/20 0500 12/06/20 0600 12/06/20 0900 12/06/20 1030  BP: (!) 142/76 (!) 176/73 (!) 162/92 (!) 179/88  Pulse: 81 79 72 70  Resp: '14 14 10 20  '$ Temp:      SpO2: 98% 99% 98% 100%  Weight:      Height:       No intake or output data in the 24 hours ending 12/06/20 1131 Filed Weights   12/05/20 2259  Weight: 85.3 kg     Examination:   Physical Exam  Constitution:  Alert, cooperative, no distress,  Appears calm and comfortable  Psychiatric: Normal and stable mood and affect, cognition intact,   HEENT: Normocephalic, PERRL, otherwise with in Normal limits  Chest:Chest symmetric Cardio vascular:  S1/S2, RRR, No murmure, No Rubs or Gallops  pulmonary: Clear to auscultation bilaterally, respirations unlabored, negative wheezes / crackles Abdomen: Soft, non-tender, non-distended, bowel sounds,no masses, no organomegaly Muscular skeletal: Limited exam - in bed, able to move  all 4 extremities, Normal strength,  Neuro: CNII-XII intact. , normal motor and sensation, reflexes intact  Extremities: No pitting edema lower extremities, +2 pulses  Skin: Dry, warm to touch, negative for any Rashes, No open wounds Wounds: per nursing documentation    ------------------------------------------------------------------------------------------------------------------------------------------    LABs:  CBC Latest Ref Rng & Units 12/05/2020 07/22/2013  WBC 4.0 - 10.5 K/uL 13.2(H) 8.5  Hemoglobin 12.0 - 15.0 g/dL 12.0 12.3  Hematocrit 36.0 - 46.0 % 37.4 37.6  Platelets 150 - 400 K/uL 383 319   CMP Latest Ref Rng & Units 12/06/2020 12/05/2020 07/22/2013  Glucose 70 - 99 mg/dL 96 218(H) 179(H)  BUN 6 - 20 mg/dL '9 9 9  '$ Creatinine 0.44 - 1.00 mg/dL 0.85 1.02(H) 0.82  Sodium 135 - 145 mmol/L 140 137 137  Potassium 3.5 - 5.1 mmol/L 3.7 3.9 4.1  Chloride 98 - 111 mmol/L 106 105 101  CO2 22 - 32 mmol/L '26 26 27  '$ Calcium 8.9 - 10.3 mg/dL 8.7(L) 8.4(L) 8.8       Micro Results Recent Results (from the past 240 hour(s))  Resp Panel by RT-PCR (Flu A&B, Covid) Nasopharyngeal Swab     Status: None   Collection Time: 12/06/20  1:32 AM   Specimen: Nasopharyngeal Swab; Nasopharyngeal(NP) swabs in vial transport medium  Result Value Ref Range Status   SARS Coronavirus 2 by RT PCR NEGATIVE NEGATIVE Final    Comment: (NOTE) SARS-CoV-2 target nucleic acids are NOT DETECTED.  The SARS-CoV-2 RNA is generally detectable in upper respiratory specimens during the acute phase of infection. The lowest concentration of SARS-CoV-2 viral copies this assay can detect is 138 copies/mL. A negative result does not preclude SARS-Cov-2 infection and should not be used as the sole basis for treatment or other patient management decisions. A negative result may occur with  improper specimen collection/handling, submission of specimen other than nasopharyngeal swab, presence of viral mutation(s) within  the areas targeted by this assay, and inadequate number of viral copies(<138 copies/mL). A negative result must be combined with clinical observations, patient history, and epidemiological information. The expected result is Negative.  Fact Sheet for Patients:  EntrepreneurPulse.com.au  Fact Sheet for Healthcare Providers:  IncredibleEmployment.be  This test is no t yet approved or cleared by the [place] FDA and  has been authorized for detection and/or diagnosis of SARS-CoV-2 by FDA under an Emergency Use Authorization (EUA). This EUA will remain  in effect (meaning this test can be used) for the duration of the COVID-19 declaration under Section 564(b)(1) of the Act, 21 U.S.C.section 360bbb-3(b)(1), unless the authorization is terminated  or revoked sooner.       Influenza A by PCR NEGATIVE NEGATIVE Final   Influenza B by PCR NEGATIVE NEGATIVE Final    Comment: (NOTE) The Xpert Xpress SARS-CoV-2/FLU/RSV plus assay is intended as an aid in the diagnosis of influenza from Nasopharyngeal swab specimens and should not be used as a sole basis for treatment. Nasal washings and aspirates are unacceptable for Xpert Xpress SARS-CoV-2/FLU/RSV testing.  Fact Sheet for Patients: EntrepreneurPulse.com.au  Fact Sheet for Healthcare Providers: IncredibleEmployment.be  This test is not yet approved or cleared by the [place] FDA and has been authorized for detection and/or diagnosis of SARS-CoV-2 by FDA under an Emergency Use Authorization (EUA). This EUA will remain in effect (meaning this test can be used) for the duration of the COVID-19 declaration under Section 564(b)(1) of the Act, 21 U.S.C. section 360bbb-3(b)(1), unless the authorization is terminated or revoked.  Performed at [facility], [address] [place], [place] [address]     Radiology Reports DG Chest [place] 1 View  Result Date:  12/05/2020 CLINICAL DATA:  Mid chest pain that radiates to right side in the neck. EXAM: PORTABLE CHEST 1 VIEW COMPARISON:  July 23, 2013 FINDINGS: The heart size and mediastinal contours are within normal limits. Aortic atherosclerosis. No focal consolidation. No pleural effusion. No pneumothorax. Degenerative change of the shoulders. Surgical anchors in the right humeral head. IMPRESSION: 1. No acute cardiopulmonary findings. 2.  Aortic Atherosclerosis (ICD10-I70.0). Electronically Signed   By: Dahlia Bailiff MD   On: 12/05/2020 23:59   MM 3D SCREEN BREAST BILATERAL  Result Date: 12/05/2020 CLINICAL DATA:  Screening. EXAM: DIGITAL SCREENING BILATERAL MAMMOGRAM WITH TOMOSYNTHESIS AND CAD TECHNIQUE: Bilateral screening digital craniocaudal and mediolateral oblique mammograms were obtained. Bilateral screening digital breast tomosynthesis was performed. The images were evaluated with  computer-aided detection. COMPARISON:  Previous exam(s). ACR Breast Density Category b: There are scattered areas of fibroglandular density. FINDINGS: There are no findings suspicious for malignancy. IMPRESSION: No mammographic evidence of malignancy. A result letter of this screening mammogram will be mailed directly to the patient. RECOMMENDATION: Screening mammogram in one year. (Code:SM-B-01Y) BI-RADS CATEGORY  1: Negative. Electronically Signed   By: Abelardo Diesel M.D.   On: 12/05/2020 09:08    SIGNED: Deatra James, MD, FHM. [facility],  Pager (please use [email or web address] to page/text) Please use Epic Secure Chat for non-urgent communication (7AM-7PM)  If 7PM-7AM, please contact night-coverage [email or web address], 12/06/2020, 11:31 AM

## 2020-12-06 NOTE — Progress Notes (Signed)
Patient received a partial amp of dextrose prior to coming to cath lab. Recheck of glucose resulted at 126 and patient stated she was feeling well.

## 2020-12-06 NOTE — CV Procedure (Signed)
Lcx/OM1 PCI Distal embolization Aggrastat bolus and infusion for 18 hrs Full report to follow   Nigel Mormon, MD Pager: [phone] Office: [phone]

## 2020-12-07 DIAGNOSIS — I214 Non-ST elevation (NSTEMI) myocardial infarction: Principal | ICD-10-CM

## 2020-12-07 LAB — BASIC METABOLIC PANEL
Anion gap: 9 (ref 5–15)
BUN: 10 mg/dL (ref 6–20)
CO2: 24 mmol/L (ref 22–32)
Calcium: 8.7 mg/dL — ABNORMAL LOW (ref 8.9–10.3)
Chloride: 103 mmol/L (ref 98–111)
Creatinine, Ser: 0.87 mg/dL (ref 0.44–1.00)
GFR, Estimated: >60 mL/min (ref >60)
Glucose, Bld: 180 mg/dL — ABNORMAL HIGH (ref 70–99)
Potassium: 4.4 mmol/L (ref 3.5–5.1)
Sodium: 136 mmol/L (ref 135–145)

## 2020-12-07 LAB — CBC
HCT: 35.7 % — ABNORMAL LOW (ref 36.0–46.0)
Hemoglobin: 11.7 g/dL — ABNORMAL LOW (ref 12.0–15.0)
MCH: 28.8 pg (ref 26.0–34.0)
MCHC: 32.8 g/dL (ref 30.0–36.0)
MCV: 87.9 fL (ref 80.0–100.0)
Platelets: 370 10*3/uL (ref 150–400)
RBC: 4.06 MIL/uL (ref 3.87–5.11)
RDW: 15.5 % (ref 11.5–15.5)
WBC: 12 10*3/uL — ABNORMAL HIGH (ref 4.0–10.5)
nRBC: 0 % (ref 0.0–0.2)

## 2020-12-07 LAB — GLUCOSE, CAPILLARY: Glucose-Capillary: 173 mg/dL — ABNORMAL HIGH (ref 70–99)

## 2020-12-07 MED ORDER — METOPROLOL SUCCINATE ER 50 MG PO TB24: 50.0000 mg | ORAL_TABLET | Freq: Every day | ORAL | 0 refills | Status: DC

## 2020-12-07 MED ORDER — METOPROLOL SUCCINATE ER 50 MG PO TB24: 50.0000 mg | ORAL_TABLET | Freq: Every day | ORAL | Status: DC

## 2020-12-07 MED ORDER — SACUBITRIL-VALSARTAN 24-26 MG PO TABS: 1.0000 | ORAL_TABLET | Freq: Two times a day (BID) | ORAL | 0 refills | Status: DC

## 2020-12-07 MED ORDER — ROSUVASTATIN CALCIUM 40 MG PO TABS: 40.0000 mg | ORAL_TABLET | Freq: Every day | ORAL | 0 refills | Status: DC

## 2020-12-07 MED ORDER — SACUBITRIL-VALSARTAN 24-26 MG PO TABS: 1.0000 | ORAL_TABLET | Freq: Two times a day (BID) | ORAL | Status: DC

## 2020-12-07 NOTE — Progress Notes (Signed)
Provided pt with a 30 day free card and a $10 copay card for [facility].

## 2020-12-07 NOTE — Progress Notes (Signed)
Subjective:  Feels well. No chest pain  Objective:  Vital Signs in the last 24 hours: Temp:  [97.9 F (36.6 C)-98.5 F (36.9 C)] 98.5 F (36.9 C) (08/13 0415) Pulse Rate:  [53-105] 57 (08/13 0806) Resp:  [7-29] 16 (08/13 0806) BP: (129-200)/(62-123) 148/65 (08/13 0806) SpO2:  [100 %] 100 % (08/13 0806) Weight:  [82.9 kg] 82.9 kg (08/13 0427)  Intake/Output from previous day: 08/12 0701 - 08/13 0700 In: 1582.3 [P.O.:220; I.V.:1362.3] Out: 425 [Urine:425]  Physical Exam Vitals and nursing note reviewed.  Constitutional:      General: She is not in acute distress.    Appearance: She is well-developed.  HENT:     Head: Normocephalic and atraumatic.  Eyes:     Conjunctiva/sclera: Conjunctivae normal.     Pupils: Pupils are equal, round, and reactive to light.  Neck:     Vascular: No JVD.  Cardiovascular:     Rate and Rhythm: Regular rhythm. Bradycardia present.     Pulses: Normal pulses and intact distal pulses.     Heart sounds: No murmur heard. Pulmonary:     Effort: Pulmonary effort is normal.     Breath sounds: Normal breath sounds. No wheezing or rales.  Abdominal:     General: Bowel sounds are normal.     Palpations: Abdomen is soft.     Tenderness: There is no rebound.  Musculoskeletal:        General: No tenderness. Normal range of motion.     Right lower leg: No edema.     Left lower leg: No edema.  Lymphadenopathy:     Cervical: No cervical adenopathy.  Skin:    General: Skin is warm and dry.  Neurological:     Mental Status: She is alert and oriented to person, place, and time.     Cranial Nerves: No cranial nerve deficit.     Lab Results: BMP Recent Labs    12/05/20 2346 12/06/20 0503 12/07/20 0353  NA 137 140 136  K 3.9 3.7 4.4  CL 105 106 103  CO2 $Re'26 26 24  'AUy$ GLUCOSE 218* 96 180*  BUN $Re'9 9 10  'Kal$ CREATININE 1.02* 0.85 0.87  CALCIUM 8.4* 8.7* 8.7*  GFRNONAA >60 >60 >60    CBC Recent Labs  Lab 12/05/20 2346 12/07/20 0353  WBC 13.2*  12.0*  RBC 4.17 4.06  HGB 12.0 11.7*  HCT 37.4 35.7*  PLT 383 370  MCV 89.7 87.9  MCH 28.8 28.8  MCHC 32.1 32.8  RDW 15.7* 15.5  LYMPHSABS 5.0*  --   MONOABS 1.0  --   EOSABS 0.4  --   BASOSABS 0.1  --     HEMOGLOBIN A1C Lab Results  Component Value Date   HGBA1C 9.4 (H) 12/06/2020   MPG 223.08 12/06/2020    Cardiac Panel (last 3 results) No results for input(s): CKTOTAL, CKMB, TROPONINI, RELINDX in the last 8760 hours.  BNP (last 3 results) No results for input(s): BNP in the last 8760 hours.  TSH No results for input(s): TSH in the last 8760 hours.  Lipid Panel     Component Value Date/Time   CHOL 201 (H) 12/06/2020 0503   TRIG 78 12/06/2020 0503   HDL 51 12/06/2020 0503   CHOLHDL 3.9 12/06/2020 0503   VLDL 16 12/06/2020 0503   LDLCALC 134 (H) 12/06/2020 0503     Hepatic Function Panel No results for input(s): PROT, ALBUMIN, AST, ALT, ALKPHOS, BILITOT, BILIDIR, IBILI in the last 8760 hours.  Imaging:  Chest Xray 12/05/2020: The heart size and mediastinal contours are within normal limits. Aortic atherosclerosis. No focal consolidation. No pleural effusion. No pneumothorax. Degenerative change of the shoulders. Surgical anchors in the right humeral head.   IMPRESSION: 1. No acute cardiopulmonary findings. 2.  Aortic Atherosclerosis (ICD10-I70.0).    Cardiac Studies:  EKG 12/06/2020: Sinus rhythm Nonspecific T wave inversion, lateral leads  Coronary intervention 12/06/2020: LM: Normal LAD: No significant disease Lcx: Prox thrombotic 95% stenosis, 60% ostial OM1 stenosis        Mid Lcx occlusion, likely chronic RCA: Mid 60% disease. Right-to-left collaterals to occluded Lcx   Successful percutaneous coronary intervention prox Lcx-OM1        PTCA and stent placement 3.0 X 30 mm Onyx Frontier drug-eluting  stent, post dilatation with 3.25X8 and 3.5X15 mm [place] balloons up to 20 atm   Distal embolization into small branches of OM1. Aggrastat bolus and  infusion started.  Echocardiogram 12/06/2020:  1. LVEF is depressed with hypokinesis of the distal anterior, basal  inferior and apical walls and severe hypokinesis of the distal lateral  walls. LVEF 40 to 45%. Left ventricular diastolic parameters are  consistent with Grade I diastolic dysfunction  (impaired relaxation). Elevated left atrial pressure.   2. Right ventricular systolic function is normal. The right ventricular  size is normal.   3. Soft tissue density epicardially, seen most prominently inferior to  RV. May represent adipose tissue, cannot exclude inflammation with  consolidation. No old images to compare . a small pericardial effusion is  present.   4. The mitral valve is normal in structure. No evidence of mitral valve  regurgitation.   5. The aortic valve is normal in structure. Aortic valve regurgitation is  not visualized.   6. The inferior vena cava is normal in size with greater than 50%  respiratory variability, suggesting right atrial pressure of 3 mmHg.   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 LVEDP minimally elevated at 16 mmhg on cath EF 40-45% with Lcx/OM territory WMA Change losartan 50 mg daily to Entresto 24-26 mg bid Added metoprolol succinate 50 mg daily Candidate for EMPACT-MI clinical trial (Empaglifozin vs placebo in post MI heart failure patients). Patient is interested in  participating. Will arrange outpatient follow up in 1-2 weeks. Consider using non-SGLT2i for diabetes management. (She received one dose of Jardiance yesterday, but not discontinued.   Hypertension: Much better controlled after revascularization.  Hyperlipidemia: Uncontrolled. LDL 134. Added Crestor 40 mg daily.   Type 2 DM: Uncontrolled. Defer management to primary team.  Candidate for EMPACT-MI clinical trial (Empaglifozin vs placebo in post MI heart failure patients). Patient is interested in participating. Will arrange outpatient follow up in 1-2 weeks. Consider using non-SGLT2i for diabetes management. (She received one dose of Jardiance yesterday, but not discontinued.   Nicotine dependence: Counseled re: cessation. Consider nicotine patch on discharge   Nigel Mormon, MD Pager: [phone] Office: [phone]

## 2020-12-07 NOTE — Discharge Summary (Addendum)
Physician Discharge Summary  Monica Bell LKT:[id number] DOB: [birth year] DOA: 12/05/2020  PCP: Monica Rival, NP  Admit date: 12/05/2020 Discharge date: 12/07/2020  Admitted From: Home Disposition:  home   Recommendations for Outpatient Follow-up and new medication changes:  Follow up with Monica Ok NP in 7 to 10 days.  Continue dual antiplatelet therapy with aspirin and ticagrelor for at least one year (08/23).  Added entresto and metoprolol for heart failure. Outpatient follow up for EMPACT MI trial (empagliflozin vs placebo)  Home Health: no   Equipment/Devices: na    Discharge Condition: stable  CODE STATUS: full  Diet recommendation:  heart healthy and diabetic prudent.   Brief/Interim Summary: Mrs. Monica Bell was admitted to the hospital with the working diagnosis of non ST elevation myocardial infarction and new systolic heat failure.   60 year old female past medical history for type 2 diabetes mellitus, hypertension and GERD who presented with chest pain.  She reported recurrent chest pain, burning in nature, second episode more severe in intensity, shortly after eating dinner.  Pain radiating to mid chest, throat and left jaw.  On her initial physical examination blood pressure 214/100, heart rate 71, respiratory rate 19, oxygen saturation 100%.  Her lungs had no wheezing or rales, heart S1-S2, present, regular, soft abdomen, no lower extremity edema.  Sodium 137, potassium 3.9, chloride 105, bicarb 26 glucose 203, BUN 9, creatinine 1.0, height sensitive troponin 90, 149, 396, 734, 1050.  White count 13.2, hemoglobin 12.0, hematocrit 37.4, platelets 383. SARS COVID-19 negative.  Urinalysis specific gravity > 1.046. Toxicology screen positive for amphetamines and benzodiazepines.  Chest radiograph no infiltrates.  EKG 74 bpm, normal axis, normal intervals, sinus rhythm, poor R wave progression, no significant ST segment or T wave changes. Follow-up EKG  negative T wave lead I-aVL, V5, V6.  Patient was placed on metoprolol for blood pressure control. Because of worsening troponin elevation she was placed on unfractionated IV heparin and transfer from [facility] to [facility] for further invasive testing.  08/12 left heart catheterization, coronary angiography, left circumflex proximal thrombotic 95% stenosis, 60% ostial M1 stenosis.  RCA mid 60% disease.  Patient underwent PTCA and stent placement proximal left circumflex.  Echocardiography showed decreased LV systolic function 45 to 34%.  Hypokinesis of the distal anterior, basal inferior and apical walls.  Severe hypokinesis of the distal lateral walls.  Patient will be discharge home with dual antiplatelet therapy and heart failure regimen, to follow up with cardiology as outpatient.   Non ST elevation myocardial infarction.  Patient placed on medical therapy including intravenous heparin, aspirin and metoprolol.  Continue losartan. Coronary angiography revealed left circumflex and RCA disease.  Underwent left circumflex PTCA/stent placement.  Echocardiography showed depressed left ventricle ejection fraction, 40 to 45%, hypokinesis of distal anterior, basal inferior and apical walls.  Severe hypokinesis of the distal lateral walls.  RV systolic function preserved.  No significant valvular disease.  Patient will continue dual antibiotic therapy for at least 1 year, continue statin therapy and blood pressure control. Follow-up as an outpatient with cardiology.  She will be enrolled in the EMPACT MI trial, empagliflozin versus placebo.  2.  New onset systolic heart failure.  Ischemic cardiomyopathy.  No signs of acute exacerbation. Patient will be discharged on Entresto, and metoprolol. Clinically euvolemic.  3.  Hypertension/hypertensive emergency improved blood pressure with metoprolol and Entresto. Follow-up as an outpatient.  4.  Type 2 diabetes mellitus, dyslipidemia.  Uncontrolled diabetes (Hgb A1c  9,4), patient received insulin sliding scale  during her hospitalization. At discharge she will resume metformin and tresiba Continue statin therapy.  5. Hypothyroid. Continue with levothyroxine   Discharge Diagnoses:  Principal Problem:   NSTEMI (non-ST elevated myocardial infarction) ([facility]) Active Problems:   Diabetes mellitus type 2, insulin dependent (HCC)   GERD (gastroesophageal reflux disease)   Hypothyroidism   Chest pain   Mild renal insufficiency   Hypertensive crisis    Discharge Instructions  Discharge Instructions     AMB Referral to Cardiac Rehabilitation - Phase II   Complete by: As directed    Diagnosis:  Coronary Stents NSTEMI     After initial evaluation and assessments completed: Virtual Based Care may be provided alone or in conjunction with Phase 2 Cardiac Rehab based on patient barriers.: Yes   Amb Referral to Cardiac Rehabilitation   Complete by: As directed    Diagnosis:  Coronary Stents NSTEMI     After initial evaluation and assessments completed: Virtual Based Care may be provided alone or in conjunction with Phase 2 Cardiac Rehab based on patient barriers.: Yes      Allergies as of 12/07/2020   No Known Allergies      Medication List     STOP taking these medications    fluconazole 150 MG tablet Commonly known as: DIFLUCAN   losartan 50 MG tablet Commonly known as: COZAAR       TAKE these medications    Aspirin Low Dose 81 MG EC tablet Generic drug: aspirin Take 1 tablet (81 mg total) by mouth daily. Swallow whole.   Brilinta 90 MG Tabs tablet Generic drug: ticagrelor Take 1 tablet (90 mg total) by mouth 2 (two) times daily.   CALTRATE 600+D PO Take 1 tablet by mouth daily.   cetirizine 10 MG tablet Commonly known as: ZYRTEC Take 10 mg by mouth daily.   cholecalciferol 25 MCG (1000 UNIT) tablet Commonly known as: VITAMIN D3 Take 1,000 Units by mouth daily.   levothyroxine 137 MCG tablet Commonly known as:  SYNTHROID Take 137 mcg by mouth daily.   magnesium oxide 400 MG tablet Commonly known as: MAG-OX Take 400 mg by mouth daily.   metFORMIN 1000 MG tablet Commonly known as: GLUCOPHAGE Take 1,000 mg by mouth 2 (two) times daily with a meal.   metoprolol succinate 50 MG 24 hr tablet Commonly known as: TOPROL-XL Take 1 tablet (50 mg total) by mouth daily. Take with or immediately following a meal. Start taking on: December 08, 2020   omeprazole 20 MG capsule Commonly known as: PRILOSEC Take 20 mg by mouth daily.   rosuvastatin 40 MG tablet Commonly known as: CRESTOR Take 1 tablet (40 mg total) by mouth daily. Start taking on: December 08, 2020   sacubitril-valsartan 24-26 MG Commonly known as: ENTRESTO Take 1 tablet by mouth 2 (two) times daily.   Tyler Aas FlexTouch 200 UNIT/ML FlexTouch Pen Generic drug: insulin degludec Inject 45 Units into the skin daily.   vitamin B-12 100 MCG tablet Commonly known as: CYANOCOBALAMIN Take 100 mcg by mouth daily.        Follow-up Information     Nigel Mormon, MD Follow up on 12/10/2020.   Specialties: Cardiology, Radiology Why: 10:45 AM Contact information: [place] [address] [phone]                No Known Allergies  Consultations: Cardiology    Procedures/Studies: CARDIAC CATHETERIZATION  Addendum Date: 12/07/2020   LM: Normal LAD: No significant disease  Lcx: Prox thrombotic 95% stenosis, 60% ostial OM1 stenosis        Mid Lcx occlusion, likely chronic RCA: Mid 60% disease. Right-to-left collaterals to occluded Lcx Successful percutaneous coronary intervention prox Lcx-OM1        PTCA and stent placement 3.0 X 30 mm Onyx Frontier drug-eluting  stent, post dilatation with 3.25X8 and 3.5X15 mm [place] balloons up to 20 atm Distal embolization into small branches of OM1. Aggrastat bolus and infusion started. Nigel Mormon, MD Pager: [phone] Office: [phone]   Result  Date: 12/07/2020 Images from the original result were not included. LM: Normal LAD: No significant disease Lcx: Prox thrombotic 95% stenosis, 60% ostial OM1 stenosis        Mid Lcx occlusion, likely chronic RCA: Mid 60% disease. Right-to-left collaterals to occluded Lcx Successful percutaneous coronary intervention prox Lcx-OM1        PTCA and stent placement 3.0 X 30 mm Onyx Frontier drug-eluting  stent, post dilatation with 3.25X8 and 3.5X15 mm [place] balloons up to 20 atm Distal embolization into small branches of OM1. Aggrastat bolus and infusion started. Nigel Mormon, MD Pager: [phone] Office: [phone]   DG Chest Port 1 View  Result Date: 12/05/2020 CLINICAL DATA:  Mid chest pain that radiates to right side in the neck. EXAM: PORTABLE CHEST 1 VIEW COMPARISON:  July 23, 2013 FINDINGS: The heart size and mediastinal contours are within normal limits. Aortic atherosclerosis. No focal consolidation. No pleural effusion. No pneumothorax. Degenerative change of the shoulders. Surgical anchors in the right humeral head. IMPRESSION: 1. No acute cardiopulmonary findings. 2.  Aortic Atherosclerosis (ICD10-I70.0). Electronically Signed   By: Dahlia Bailiff MD   On: 12/05/2020 23:59   MM 3D SCREEN BREAST BILATERAL  Result Date: 12/05/2020 CLINICAL DATA:  Screening. EXAM: DIGITAL SCREENING BILATERAL MAMMOGRAM WITH TOMOSYNTHESIS AND CAD TECHNIQUE: Bilateral screening digital craniocaudal and mediolateral oblique mammograms were obtained. Bilateral screening digital breast tomosynthesis was performed. The images were evaluated with computer-aided detection. COMPARISON:  Previous exam(s). ACR Breast Density Category b: There are scattered areas of fibroglandular density. FINDINGS: There are no findings suspicious for malignancy. IMPRESSION: No mammographic evidence of malignancy. A result letter of this screening mammogram will be mailed directly to the patient. RECOMMENDATION: Screening mammogram in one  year. (Code:[id number]-B-01Y) BI-RADS CATEGORY  1: Negative. Electronically Signed   By: Abelardo Diesel M.D.   On: 12/05/2020 09:08   ECHOCARDIOGRAM COMPLETE  Result Date: 12/06/2020    ECHOCARDIOGRAM REPORT   Patient Name:   Monica Bell Date of Exam: 12/06/2020 Medical Rec #:  [id number]          Height:       71.0 in Accession #:    [id number]         Weight:       188.0 lb Date of Birth:  October 29, 1960          BSA:          2.054 m Patient Age:    76 years           BP:           176/73 mmHg Patient Gender: F                  HR:           79 bpm. Exam Location:  [facility] Procedure: 2D Echo, Cardiac Doppler and Color Doppler Indications:    Elevated Troponin  History:  Patient has no prior history of Echocardiogram examinations.                 Signs/Symptoms:Chest Pain; Risk Factors:Hypertension, Diabetes,                 Dyslipidemia and Current Smoker.  Sonographer:    Wenda Low Referring Phys: Mitzi Hansen MD IMPRESSIONS  1. LVEF is depressed with hypokinesis of the distal anterior, basal inferior and apical walls and severe hypokinesis of the distal lateral walls. LVEF 40 to 45%. Left ventricular diastolic parameters are consistent with Grade I diastolic dysfunction (impaired relaxation). Elevated left atrial pressure.  2. Right ventricular systolic function is normal. The right ventricular size is normal.  3. Soft tissue density epicardially, seen most prominently inferior to RV. May represent adipose tissue, cannot exclude inflammation with consolidation. No old images to compare . a small pericardial effusion is present.  4. The mitral valve is normal in structure. No evidence of mitral valve regurgitation.  5. The aortic valve is normal in structure. Aortic valve regurgitation is not visualized.  6. The inferior vena cava is normal in size with greater than 50% respiratory variability, suggesting right atrial pressure of 3 mmHg. FINDINGS  Left Ventricle: LVEF is depressed with hypokinesis of  the distal anterior, basal inferior and apical walls and severe hypokinesis of the mid/distal lateral wall. Left ventricular ejection fraction, by estimation, is 40 to 45%. The left ventricle has mildly decreased function. The left ventricular internal cavity size was normal in size. There is no left ventricular hypertrophy. Left ventricular diastolic parameters are consistent with Grade I diastolic dysfunction (impaired relaxation). Elevated left atrial pressure. Right Ventricle: The right ventricular size is normal. Right vetricular wall thickness was not assessed. Right ventricular systolic function is normal. Left Atrium: Left atrial size was normal in size. Right Atrium: Right atrial size was normal in size. Pericardium: Soft tissue density epicardially, seen most prominently inferior to RV. May represent adipose tissue, cannot exclude inflammation with consolidation. No old images to compare. A small pericardial effusion is present. Mitral Valve: The mitral valve is normal in structure. No evidence of mitral valve regurgitation. MV peak gradient, 6.6 mmHg. The mean mitral valve gradient is 2.0 mmHg. Tricuspid Valve: The tricuspid valve is normal in structure. Tricuspid valve regurgitation is trivial. Aortic Valve: The aortic valve is normal in structure. Aortic valve regurgitation is not visualized. Aortic valve mean gradient measures 4.0 mmHg. Aortic valve peak gradient measures 7.7 mmHg. Aortic valve area, by VTI measures 1.77 cm. Pulmonic Valve: The pulmonic valve was normal in structure. Pulmonic valve regurgitation is not visualized. Aorta: The aortic root is normal in size and structure. Venous: The inferior vena cava is normal in size with greater than 50% respiratory variability, suggesting right atrial pressure of 3 mmHg. IAS/Shunts: No atrial level shunt detected by color flow Doppler.  LEFT VENTRICLE PLAX 2D LVIDd:         4.37 cm     Diastology LVIDs:         3.52 cm     LV e' medial:    5.52 cm/s  LV PW:         1.06 cm     LV E/e' medial:  17.0 LV IVS:        0.93 cm     LV e' lateral:   6.05 cm/s LVOT diam:     1.90 cm     LV E/e' lateral: 15.5 LV SV:  60 LV SV Index:   29 LVOT Area:     2.84 cm  LV Volumes (MOD) LV vol d, MOD A2C: 68.8 ml LV vol d, MOD A4C: 77.4 ml LV vol s, MOD A2C: 28.2 ml LV vol s, MOD A4C: 33.6 ml LV SV MOD A2C:     40.6 ml LV SV MOD A4C:     77.4 ml LV SV MOD BP:      41.4 ml RIGHT VENTRICLE RV Basal diam:  3.59 cm RV Mid diam:    3.08 cm RV S prime:     14.50 cm/s TAPSE (M-mode): 1.8 cm LEFT ATRIUM             Index       RIGHT ATRIUM           Index LA diam:        3.20 cm 1.56 cm/m  RA Area:     12.40 cm LA Vol (A2C):   41.9 ml 20.40 ml/m RA Volume:   32.80 ml  15.97 ml/m LA Vol (A4C):   36.5 ml 17.77 ml/m LA Biplane Vol: 42.6 ml 20.74 ml/m  AORTIC VALVE AV Area (Vmax):    1.92 cm AV Area (Vmean):   1.67 cm AV Area (VTI):     1.77 cm AV Vmax:           139.00 cm/s AV Vmean:          96.000 cm/s AV VTI:            0.341 m AV Peak Grad:      7.7 mmHg AV Mean Grad:      4.0 mmHg LVOT Vmax:         94.00 cm/s LVOT Vmean:        56.700 cm/s LVOT VTI:          0.213 m LVOT/AV VTI ratio: 0.62  AORTA Ao Root diam: 2.60 cm MITRAL VALVE MV Area (PHT): 4.33 cm     SHUNTS MV Area VTI:   1.62 cm     Systemic VTI:  0.21 m MV Peak grad:  6.6 mmHg     Systemic Diam: 1.90 cm MV Mean grad:  2.0 mmHg MV Vmax:       1.28 m/s MV Vmean:      72.4 cm/s MV Decel Time: 175 msec MV E velocity: 94.00 cm/s MV A velocity: 109.00 cm/s MV E/A ratio:  0.86 Dorris Carnes MD Electronically signed by Dorris Carnes MD Signature Date/Time: 12/06/2020/4:53:24 PM    Final      Procedures: cardiac catheterization and PCA   Subjective: Patient is feeling better, no nausea or vomiting, no dyspnea or chest pain, no edema,.   Discharge Exam: Vitals:   12/07/20 0415 12/07/20 0806  BP: 134/74 (!) 148/65  Pulse: (!) 56 (!) 57  Resp: 18 16  Temp: 98.5 F (36.9 C)   SpO2: 100% 100%   Vitals:    12/07/20 0028 12/07/20 0415 12/07/20 0427 12/07/20 0806  BP: (!) 145/67 134/74  (!) 148/65  Pulse: (!) 57 (!) 56  (!) 57  Resp: $Remo'18 18  16  'UszlO$ Temp:  98.5 F (36.9 C)    TempSrc:  Oral    SpO2: 100% 100%  100%  Weight:   82.9 kg   Height:        General: Not in pain or dyspnea.  Neurology: Awake and alert, non focal  E ENT: no pallor, no icterus, oral mucosa moist Cardiovascular:  No JVD. S1-S2 present, rhythmic, no gallops, rubs, or murmurs. No lower extremity edema. Pulmonary: positive breath sounds bilaterally, adequate air movement, no wheezing, rhonchi or rales. Gastrointestinal. Abdomen soft and non tender Skin. No rashes Musculoskeletal: no joint deformities   The results of significant diagnostics from this hospitalization (including imaging, microbiology, ancillary and laboratory) are listed below for reference.     Microbiology: Recent Results (from the past 240 hour(s))  Resp Panel by RT-PCR (Flu A&B, Covid) Nasopharyngeal Swab     Status: None   Collection Time: 12/06/20  1:32 AM   Specimen: Nasopharyngeal Swab; Nasopharyngeal(NP) swabs in vial transport medium  Result Value Ref Range Status   SARS Coronavirus 2 by RT PCR NEGATIVE NEGATIVE Final    Comment: (NOTE) SARS-CoV-2 target nucleic acids are NOT DETECTED.  The SARS-CoV-2 RNA is generally detectable in upper respiratory specimens during the acute phase of infection. The lowest concentration of SARS-CoV-2 viral copies this assay can detect is 138 copies/mL. A negative result does not preclude SARS-Cov-2 infection and should not be used as the sole basis for treatment or other patient management decisions. A negative result may occur with  improper specimen collection/handling, submission of specimen other than nasopharyngeal swab, presence of viral mutation(s) within the areas targeted by this assay, and inadequate number of viral copies(<138 copies/mL). A negative result must be combined with clinical  observations, patient history, and epidemiological information. The expected result is Negative.  Fact Sheet for Patients:  EntrepreneurPulse.com.au  Fact Sheet for Healthcare Providers:  IncredibleEmployment.be  This test is no t yet approved or cleared by the [place] FDA and  has been authorized for detection and/or diagnosis of SARS-CoV-2 by FDA under an Emergency Use Authorization (EUA). This EUA will remain  in effect (meaning this test can be used) for the duration of the COVID-19 declaration under Section 564(b)(1) of the Act, 21 U.S.C.section 360bbb-3(b)(1), unless the authorization is terminated  or revoked sooner.       Influenza A by PCR NEGATIVE NEGATIVE Final   Influenza B by PCR NEGATIVE NEGATIVE Final    Comment: (NOTE) The Xpert Xpress SARS-CoV-2/FLU/RSV plus assay is intended as an aid in the diagnosis of influenza from Nasopharyngeal swab specimens and should not be used as a sole basis for treatment. Nasal washings and aspirates are unacceptable for Xpert Xpress SARS-CoV-2/FLU/RSV testing.  Fact Sheet for Patients: EntrepreneurPulse.com.au  Fact Sheet for Healthcare Providers: IncredibleEmployment.be  This test is not yet approved or cleared by the [place] FDA and has been authorized for detection and/or diagnosis of SARS-CoV-2 by FDA under an Emergency Use Authorization (EUA). This EUA will remain in effect (meaning this test can be used) for the duration of the COVID-19 declaration under Section 564(b)(1) of the Act, 21 U.S.C. section 360bbb-3(b)(1), unless the authorization is terminated or revoked.  Performed at [facility], [address], [place], [place] [address]      Labs: BNP (last 3 results) No results for input(s): BNP in the last 8760 hours. Basic Metabolic Panel: Recent Labs  Lab 12/05/20 2346 12/06/20 0503 12/07/20 0353  NA 137 140 136  K 3.9 3.7  4.4  CL 105 106 103  CO2 $Re'26 26 24  'udc$ GLUCOSE 218* 96 180*  BUN $Re'9 9 10  'dAN$ CREATININE 1.02* 0.85 0.87  CALCIUM 8.4* 8.7* 8.7*  MG  --  2.0  --    Liver Function Tests: No results for input(s): AST, ALT, ALKPHOS, BILITOT, PROT, ALBUMIN in the last 168 hours. No  results for input(s): LIPASE, AMYLASE in the last 168 hours. No results for input(s): AMMONIA in the last 168 hours. CBC: Recent Labs  Lab 12/05/20 2346 12/07/20 0353  WBC 13.2* 12.0*  NEUTROABS 6.7  --   HGB 12.0 11.7*  HCT 37.4 35.7*  MCV 89.7 87.9  PLT 383 370   Cardiac Enzymes: No results for input(s): CKTOTAL, CKMB, CKMBINDEX, TROPONINI in the last 168 hours. BNP: Invalid input(s): POCBNP CBG: Recent Labs  Lab 12/06/20 1221 12/06/20 1312 12/06/20 1721 12/06/20 2112 12/07/20 0739  GLUCAP 87 129* 107* 170* 173*   D-Dimer No results for input(s): DDIMER in the last 72 hours. Hgb A1c Recent Labs    12/06/20 0503  HGBA1C 9.4*   Lipid Profile Recent Labs    12/06/20 0503  CHOL 201*  HDL 51  LDLCALC 134*  TRIG 78  CHOLHDL 3.9   Thyroid function studies No results for input(s): TSH, T4TOTAL, T3FREE, THYROIDAB in the last 72 hours.  Invalid input(s): FREET3 Anemia work up No results for input(s): VITAMINB12, FOLATE, FERRITIN, TIBC, IRON, RETICCTPCT in the last 72 hours. Urinalysis    Component Value Date/Time   COLORURINE YELLOW 12/06/2020 2053   APPEARANCEUR CLEAR 12/06/2020 2053   LABSPEC >1.046 (H) 12/06/2020 2053   PHURINE 7.0 12/06/2020 2053   GLUCOSEU NEGATIVE 12/06/2020 2053   HGBUR NEGATIVE 12/06/2020 2053   BILIRUBINUR NEGATIVE 12/06/2020 2053   KETONESUR NEGATIVE 12/06/2020 2053   PROTEINUR NEGATIVE 12/06/2020 2053   NITRITE NEGATIVE 12/06/2020 2053   LEUKOCYTESUR TRACE (A) 12/06/2020 2053   Sepsis Labs Invalid input(s): PROCALCITONIN,  WBC,  LACTICIDVEN Microbiology Recent Results (from the past 240 hour(s))  Resp Panel by RT-PCR (Flu A&B, Covid) Nasopharyngeal Swab     Status:  None   Collection Time: 12/06/20  1:32 AM   Specimen: Nasopharyngeal Swab; Nasopharyngeal(NP) swabs in vial transport medium  Result Value Ref Range Status   SARS Coronavirus 2 by RT PCR NEGATIVE NEGATIVE Final    Comment: (NOTE) SARS-CoV-2 target nucleic acids are NOT DETECTED.  The SARS-CoV-2 RNA is generally detectable in upper respiratory specimens during the acute phase of infection. The lowest concentration of SARS-CoV-2 viral copies this assay can detect is 138 copies/mL. A negative result does not preclude SARS-Cov-2 infection and should not be used as the sole basis for treatment or other patient management decisions. A negative result may occur with  improper specimen collection/handling, submission of specimen other than nasopharyngeal swab, presence of viral mutation(s) within the areas targeted by this assay, and inadequate number of viral copies(<138 copies/mL). A negative result must be combined with clinical observations, patient history, and epidemiological information. The expected result is Negative.  Fact Sheet for Patients:  EntrepreneurPulse.com.au  Fact Sheet for Healthcare Providers:  IncredibleEmployment.be  This test is no t yet approved or cleared by the [place] FDA and  has been authorized for detection and/or diagnosis of SARS-CoV-2 by FDA under an Emergency Use Authorization (EUA). This EUA will remain  in effect (meaning this test can be used) for the duration of the COVID-19 declaration under Section 564(b)(1) of the Act, 21 U.S.C.section 360bbb-3(b)(1), unless the authorization is terminated  or revoked sooner.       Influenza A by PCR NEGATIVE NEGATIVE Final   Influenza B by PCR NEGATIVE NEGATIVE Final    Comment: (NOTE) The Xpert Xpress SARS-CoV-2/FLU/RSV plus assay is intended as an aid in the diagnosis of influenza from Nasopharyngeal swab specimens and should not be used as a sole basis  for  treatment. Nasal washings and aspirates are unacceptable for Xpert Xpress SARS-CoV-2/FLU/RSV testing.  Fact Sheet for Patients: EntrepreneurPulse.com.au  Fact Sheet for Healthcare Providers: IncredibleEmployment.be  This test is not yet approved or cleared by the [place] FDA and has been authorized for detection and/or diagnosis of SARS-CoV-2 by FDA under an Emergency Use Authorization (EUA). This EUA will remain in effect (meaning this test can be used) for the duration of the COVID-19 declaration under Section 564(b)(1) of the Act, 21 U.S.C. section 360bbb-3(b)(1), unless the authorization is terminated or revoked.  Performed at [facility], [address] [place], [place] [address]      Time coordinating discharge: 45 minutes  SIGNED:   Tawni Millers, MD  [facility] 12/07/2020, 10:05 AM

## 2020-12-07 NOTE — Progress Notes (Signed)
CARDIAC REHAB PHASE I   PRE:  Rate/Rhythm: 25 SR  BP:  Supine:   Sitting: 145/74  Standing:    SaO2: 100% RA  MODE:  Ambulation: 400 ft   POST:  Rate/Rhythm: 69 SR  BP:  Supine:   Sitting: 156/78  Standing:    SaO2: 99% RA Tolerated ambulation well without angina symptoms or difficulty. Education completed re: MI, activity restrictions, exercise progression, angina symptoms, NTG usage, when to call the doctor, when to call 911, and cath site care and restrictions. Also, completed education for dual platelet therapy.  Referred to phase II cardiac rehab @ [facility]   Liliane Channel RN, BSN 12/07/2020 10:04 AM [phone]

## 2020-12-07 NOTE — Plan of Care (Signed)
  Problem: Education: Goal: Knowledge of General Education information will improve Description: Including pain rating scale, medication(s)/side effects and non-pharmacologic comfort measures Outcome: Progressing   Problem: Clinical Measurements: Goal: Diagnostic test results will improve Outcome: Progressing Goal: Respiratory complications will improve Outcome: Progressing Goal: Cardiovascular complication will be avoided Outcome: Progressing   Problem: Activity: Goal: Risk for activity intolerance will decrease Outcome: Progressing   Problem: Nutrition: Goal: Adequate nutrition will be maintained Outcome: Progressing   

## 2020-12-07 NOTE — Progress Notes (Signed)
TR BAND REMOVAL  LOCATION:    right radial  DEFLATED PER PROTOCOL:    Yes.    TIME BAND OFF / DRESSING APPLIED:    2045  SITE UPON ARRIVAL:    Level 0  SITE AFTER BAND REMOVAL:    Level 0  CIRCULATION SENSATION AND MOVEMENT:    Within Normal Limits   Yes.    COMMENTS:  Pt.tolerated well

## 2020-12-08 LAB — MICROALBUMIN / CREATININE URINE RATIO
Creatinine, Urine: 65.3 mg/dL
Microalb Creat Ratio: 5 mg/g creat (ref 0–29)
Microalb, Ur: 3.4 ug/mL — ABNORMAL HIGH

## 2020-12-09 ENCOUNTER — Telehealth: Payer: Self-pay

## 2020-12-09 ENCOUNTER — Encounter (HOSPITAL_COMMUNITY): Payer: Self-pay | Admitting: Cardiology

## 2020-12-09 MED FILL — Midazolam HCl Inj 2 MG/2ML (Base Equivalent): INTRAMUSCULAR | Qty: 2 | Status: AC

## 2020-12-09 MED FILL — Lidocaine HCl Local Preservative Free (PF) Inj 1%: INTRAMUSCULAR | Qty: 30 | Status: AC

## 2020-12-09 MED FILL — Verapamil HCl IV Soln 2.5 MG/ML: INTRAVENOUS | Qty: 2 | Status: AC

## 2020-12-09 MED FILL — Nitroglycerin IV Soln 100 MCG/ML in D5W: INTRA_ARTERIAL | Qty: 10 | Status: AC

## 2020-12-09 MED FILL — Fentanyl Citrate Preservative Free (PF) Inj 100 MCG/2ML: INTRAMUSCULAR | Qty: 2 | Status: AC

## 2020-12-09 NOTE — Progress Notes (Signed)
Patient referred by [name], NP for coronary artery disease  Subjective:   Monica Bell, female    DOB: 1960-11-30, 60 y.o.   MRN: [id number]  Chief Complaint  Patient presents with   NSTEMI   Coronary Artery Disease   New Patient (Initial Visit)   Hospitalization Follow-up     HPI  60 year old African-American female with hypertension, hyperlipidemia, type 2 diabetes mellitus, tobacco dependence, CAD, HFrEF (EF 40-45%)  Patient recently underwent PCI to Lcx/OM after admission with NSTEMI in 11/2020. Patient has CTO Mid Lcx and moderate disease in mid RCA. Patient's retrosternal chest pain with radiation to jaw has completely resolved since PCI. She has occasional episodes, different from her presentation chest pain, that occur for a few seconds with deep breathing. Patient is compliant with her medical therapy. Blood pressure is still elevated.    Past Medical History:  Diagnosis Date   Abnormal mammogram    Allergic rhinitis due to pollen 08/01/2019   Arthritis 01/06/2019   osteoarthritis of both knees   BMI 28.0-28.9,adult 08/01/2019   Cystocele, unspecified (CODE)    Diabetes mellitus    GERD (gastroesophageal reflux disease)    Heart murmur    Hyperlipemia, mixed 12/14/2018   Hypertension    Hypothyroidism    Stress incontinence, female    Thyroid disease    Type 2 diabetes mellitus, without long-term current use of insulin ([place]) 01/06/2019   Vitamin D deficiency 01/06/2019     Past Surgical History:  Procedure Laterality Date   COLONOSCOPY WITH PROPOFOL N/A 03/18/2020   Procedure: COLONOSCOPY WITH PROPOFOL;  Surgeon: Lesly Rubenstein, MD;  Location: [facility] ENDOSCOPY;  Service: Endoscopy;  Laterality: N/A;   CORONARY STENT INTERVENTION N/A 12/06/2020   Procedure: CORONARY STENT INTERVENTION;  Surgeon: Nigel Mormon, MD;  Location: [facility];  Service: Cardiovascular;  Laterality: N/A;   INTRAVASCULAR ULTRASOUND/IVUS N/A  12/06/2020   Procedure: Intravascular Ultrasound/IVUS;  Surgeon: Nigel Mormon, MD;  Location: [place] [facility];  Service: Cardiovascular;  Laterality: N/A;   LEFT HEART CATH AND CORONARY ANGIOGRAPHY N/A 12/06/2020   Procedure: LEFT HEART CATH AND CORONARY ANGIOGRAPHY;  Surgeon: Nigel Mormon, MD;  Location: [place] [facility];  Service: Cardiovascular;  Laterality: N/A;   ROTATOR CUFF REPAIR     THYROIDECTOMY       Social History   Tobacco Use  Smoking Status Every Day   Packs/day: 0.50   Years: 15.00   Pack years: 7.50   Types: Cigarettes  Smokeless Tobacco Never    Social History   Substance and Sexual Activity  Alcohol Use No     Family History  Problem Relation Age of Onset   Diabetes Mother    Cancer Mother    COPD Father    Hypertension Father      Current Outpatient Medications on File Prior to Visit  Medication Sig Dispense Refill   aspirin 81 MG EC tablet Take 1 tablet (81 mg total) by mouth daily. Swallow whole. 30 tablet 2   Calcium Carbonate-Vitamin D (CALTRATE 600+D PO) Take 1 tablet by mouth daily.     cetirizine (ZYRTEC) 10 MG tablet Take 10 mg by mouth daily.     cholecalciferol (VITAMIN D3) 25 MCG (1000 UNIT) tablet Take 1,000 Units by mouth daily.     levothyroxine (SYNTHROID) 137 MCG tablet Take 137 mcg by mouth daily.     magnesium oxide (MAG-OX) 400 MG tablet Take 400 mg by mouth daily.  metFORMIN (GLUCOPHAGE) 1000 MG tablet Take 1,000 mg by mouth 2 (two) times daily with a meal.     metoprolol succinate (TOPROL-XL) 50 MG 24 hr tablet Take 1 tablet (50 mg total) by mouth daily. Take with or immediately following a meal. 30 tablet 0   omeprazole (PRILOSEC) 20 MG capsule Take 20 mg by mouth daily.     rosuvastatin (CRESTOR) 40 MG tablet Take 1 tablet (40 mg total) by mouth daily. 30 tablet 0   sacubitril-valsartan (ENTRESTO) 24-26 MG Take 1 tablet by mouth 2 (two) times daily. 60 tablet 0   ticagrelor (BRILINTA) 90 MG TABS tablet  Take 1 tablet (90 mg total) by mouth 2 (two) times daily. 60 tablet 2   TRESIBA FLEXTOUCH 200 UNIT/ML FlexTouch Pen Inject 45 Units into the skin daily.     vitamin B-12 (CYANOCOBALAMIN) 100 MCG tablet Take 100 mcg by mouth daily.     No current facility-administered medications on file prior to visit.    Cardiovascular and other pertinent studies:  EKG 12/10/2020: Sinus rhythm 65 bpm Possible old anteroseptal infarct Inferolateral T wave inversion, nonspecific   Coronary intervention 12/06/2020: LM: Normal LAD: No significant disease Lcx: Prox thrombotic 95% stenosis, 60% ostial OM1 stenosis        Mid Lcx occlusion, likely chronic RCA: Mid 60% disease. Right-to-left collaterals to occluded Lcx   Successful percutaneous coronary intervention prox Lcx-OM1        PTCA and stent placement 3.0 X 30 mm Onyx Frontier drug-eluting  stent, post dilatation with 3.25X8 and 3.5X15 mm [place] balloons up to 20 atm   Distal embolization into small branches of OM1. Aggrastat bolus and infusion started.  Echocardiogram 12/06/2020:  1. LVEF is depressed with hypokinesis of the distal anterior, basal  inferior and apical walls and severe hypokinesis of the distal lateral  walls. LVEF 40 to 45%. Left ventricular diastolic parameters are  consistent with Grade I diastolic dysfunction  (impaired relaxation). Elevated left atrial pressure.   2. Right ventricular systolic function is normal. The right ventricular  size is normal.   3. Soft tissue density epicardially, seen most prominently inferior to  RV. May represent adipose tissue, cannot exclude inflammation with  consolidation. No old images to compare . a small pericardial effusion is  present.   4. The mitral valve is normal in structure. No evidence of mitral valve  regurgitation.   5. The aortic valve is normal in structure. Aortic valve regurgitation is  not visualized.   6. The inferior vena cava is normal in size with greater than 50%   respiratory variability, suggesting right atrial pressure of 3 mmHg.    Recent labs: 12/07/2020: Glucose 180, BUN/Cr 10/0.87. EGFR >60. Na/K 136/4.4.  H/H 11.7/35.7. MCV 87. Platelets 370 HbA1C 9.4% Chol 201, TG 78, HDL 51, LDL 134 TSH N/A  Results for Mielke, Christeena D (MRN [id number]) as of 12/10/2020 11:10  Ref. Range 12/06/2020 01:39 12/06/2020 05:03 12/06/2020 07:46 12/06/2020 10:07  Troponin I (High Sensitivity) Latest Ref Range: <18 ng/L 149 (HH) 396 (HH) 734 (HH) 1,050 (HH)     Review of Systems  Cardiovascular:  Positive for chest pain. Negative for dyspnea on exertion, leg swelling, palpitations and syncope.       Vitals:   12/10/20 1111  BP: (!) 150/70  Pulse: 84  Resp: 16  Temp: 98.3 F (36.8 C)  SpO2: 98%     Body mass index is 25.1 kg/m. Filed Weights   12/10/20 1111  Weight: 180   lb (81.6 kg)     Objective:   Physical Exam Vitals and nursing note reviewed.  Constitutional:      General: She is not in acute distress. Neck:     Vascular: No JVD.  Cardiovascular:     Rate and Rhythm: Normal rate and regular rhythm.     Heart sounds: Normal heart sounds. No murmur heard. Pulmonary:     Effort: Pulmonary effort is normal.     Breath sounds: Normal breath sounds. No wheezing or rales.  Musculoskeletal:     Right lower leg: No edema.     Left lower leg: No edema.        Assessment & Recommendations:   60 year old African-American female with hypertension, hyperlipidemia, type 2 diabetes mellitus, tobacco dependence, admitted with NSTEMI   CAD: NSTEMI 11/2020. Culprit: thrombotic 95% stenosis Prox Lcx into large OM1 Non-culprit: Mid Lcx chronic occlusion with R-to-L grade 3 collaterals                     Mid RCA 60% stenosis Recommend DAPT with Aspirin and Brilinta till 11/2021 Metoprolol succinate 50 mg daily Crestor 40 mg daily Aggressive diabetes management with PCP (A1C is 9%) Recommend exercise nuclear stress test to evaluate for  residual ischemia.    HFrEF: Clinically euvolumic. LVEDP minimally elevated at 16 mmhg on cath EF 40-45% with Lcx/OM territory WMA Increase Entresto 24-26 mg bid Continue metoprolol succinate 50 mg daily Candidate for EMPACT-MI clinical trial (Empaglifozin vs placebo in post MI heart failure patients). Patient is interested in participating but wants to talk to her daughter.  Consider using non-SGLT2i for diabetes management.    Hypertension: Uncontrolled. Changes as above.   Hyperlipidemia: Uncontrolled. LDL 134. Added Crestor 40 mg daily.    Type 2 DM: Uncontrolled. Defer management to primary team.  Candidate for EMPACT-MI clinical trial (Empaglifozin vs placebo in post MI heart failure patients). Patient is interested in participating. Will arrange outpatient follow up in 1-2 weeks. Consider using non-SGLT2i for diabetes management. (She received one dose of Jardiance yesterday, but not discontinued.    Nicotine dependence: Counseled re: cessation. Consider nicotine patch on discharge    Thank you for referring the patient to [place]. Please feel free to contact with any questions.   Nigel Mormon, MD Pager: [phone] Office: [phone]

## 2020-12-09 NOTE — Telephone Encounter (Signed)
Please be sure to schedule follow up as requested.  Thanks MJP

## 2020-12-09 NOTE — Telephone Encounter (Signed)
Location of hospitalization: [place] Reason for hospitalization: [facility] Date of discharge: 12/07/20 Date of first communication with patient: today Person contacting patient: Monica Bell  Current symptoms: NA Do you understand why you were in the Hospital: Yes Questions regarding discharge instructions: None Where were you discharged to: Home Medications reviewed: Yes Allergies reviewed: Yes Dietary changes reviewed: Yes. Discussed low fat and low salt diet.  Referals reviewed: NA Activities of Daily Living: Able to with mild limitations Any transportation issues/concerns: None Any patient concerns: None Confirmed importance & date/time of Follow up appt: Yes Confirmed with patient if condition begins to worsen call. Pt was given the office number and encouraged to call back with questions or concerns: Yes

## 2020-12-09 NOTE — Telephone Encounter (Signed)
Her appointment is tomorrow.

## 2020-12-10 ENCOUNTER — Ambulatory Visit: Payer: BC Managed Care – PPO | Admitting: Cardiology

## 2020-12-10 ENCOUNTER — Encounter: Payer: Self-pay | Admitting: Cardiology

## 2020-12-10 ENCOUNTER — Other Ambulatory Visit: Payer: Self-pay

## 2020-12-10 ENCOUNTER — Ambulatory Visit: Payer: BC Managed Care – PPO | Admitting: Podiatry

## 2020-12-10 VITALS — BP 150/70 | HR 84 | Temp 98.3°F | Resp 16 | Ht 71.0 in | Wt 180.0 lb

## 2020-12-10 DIAGNOSIS — I2510 Atherosclerotic heart disease of native coronary artery without angina pectoris: Secondary | ICD-10-CM | POA: Insufficient documentation

## 2020-12-10 DIAGNOSIS — M2041 Other hammer toe(s) (acquired), right foot: Secondary | ICD-10-CM

## 2020-12-10 DIAGNOSIS — I5020 Unspecified systolic (congestive) heart failure: Secondary | ICD-10-CM

## 2020-12-10 DIAGNOSIS — I25118 Atherosclerotic heart disease of native coronary artery with other forms of angina pectoris: Secondary | ICD-10-CM

## 2020-12-10 DIAGNOSIS — I10 Essential (primary) hypertension: Secondary | ICD-10-CM

## 2020-12-10 DIAGNOSIS — Q828 Other specified congenital malformations of skin: Secondary | ICD-10-CM

## 2020-12-10 DIAGNOSIS — M2042 Other hammer toe(s) (acquired), left foot: Secondary | ICD-10-CM | POA: Diagnosis not present

## 2020-12-10 MED ORDER — SACUBITRIL-VALSARTAN 24-26 MG PO TABS: 2.0000 | ORAL_TABLET | Freq: Two times a day (BID) | ORAL | 0 refills | Status: DC

## 2020-12-10 MED ORDER — ENTRESTO 49-51 MG PO TABS: 1.0000 | ORAL_TABLET | Freq: Two times a day (BID) | ORAL | 2 refills | Status: DC

## 2020-12-10 NOTE — Progress Notes (Signed)
Subjective:  Patient ID: Monica Bell, female    DOB: Jul 29, 1960,  MRN: [id number]  Chief Complaint  Patient presents with   Callouses    Bilateral corns on 5th toe     60 y.o. female presents with the above complaint.  Patient presents with follow-up of bilateral fifth digit hyperkeratotic lesion/corn.  Patient states she is doing little bit better the debridement does help.  She is a diabetic with last A1c of 9.4%.  She denies any other acute complaints.   Review of Systems: Negative except as noted in the HPI. Denies N/V/F/Ch.  Past Medical History:  Diagnosis Date   Abnormal mammogram    Allergic rhinitis due to pollen 08/01/2019   Arthritis 01/06/2019   osteoarthritis of both knees   BMI 28.0-28.9,adult 08/01/2019   Cystocele, unspecified (CODE)    Diabetes mellitus    GERD (gastroesophageal reflux disease)    Heart murmur    Hyperlipemia, mixed 12/14/2018   Hypertension    Hypothyroidism    Stress incontinence, female    Thyroid disease    Type 2 diabetes mellitus, without long-term current use of insulin ([place]) 01/06/2019   Vitamin D deficiency 01/06/2019    Current Outpatient Medications:    aspirin 81 MG EC tablet, Take 1 tablet (81 mg total) by mouth daily. Swallow whole., Disp: 30 tablet, Rfl: 2   Calcium Carbonate-Vitamin D (CALTRATE 600+D PO), Take 1 tablet by mouth daily., Disp: , Rfl:    cetirizine (ZYRTEC) 10 MG tablet, Take 10 mg by mouth daily., Disp: , Rfl:    cholecalciferol (VITAMIN D3) 25 MCG (1000 UNIT) tablet, Take 1,000 Units by mouth daily., Disp: , Rfl:    levothyroxine (SYNTHROID) 137 MCG tablet, Take 137 mcg by mouth daily., Disp: , Rfl:    magnesium oxide (MAG-OX) 400 MG tablet, Take 400 mg by mouth daily., Disp: , Rfl:    metFORMIN (GLUCOPHAGE) 1000 MG tablet, Take 1,000 mg by mouth 2 (two) times daily with a meal., Disp: , Rfl:    metoprolol succinate (TOPROL-XL) 50 MG 24 hr tablet, Take 1 tablet (50 mg total) by mouth daily. Take with  or immediately following a meal., Disp: 30 tablet, Rfl: 0   omeprazole (PRILOSEC) 20 MG capsule, Take 20 mg by mouth daily., Disp: , Rfl:    rosuvastatin (CRESTOR) 40 MG tablet, Take 1 tablet (40 mg total) by mouth daily., Disp: 30 tablet, Rfl: 0   sacubitril-valsartan (ENTRESTO) 24-26 MG, Take 2 tablets by mouth 2 (two) times daily., Disp: 1 tablet, Rfl: 0   sacubitril-valsartan (ENTRESTO) 49-51 MG, Take 1 tablet by mouth 2 (two) times daily. Start this bottle after you finish 24-26 mg bottle, Disp: 60 tablet, Rfl: 2   ticagrelor (BRILINTA) 90 MG TABS tablet, Take 1 tablet (90 mg total) by mouth 2 (two) times daily., Disp: 60 tablet, Rfl: 2   TRESIBA FLEXTOUCH 200 UNIT/ML FlexTouch Pen, Inject 45 Units into the skin daily., Disp: , Rfl:    vitamin B-12 (CYANOCOBALAMIN) 100 MCG tablet, Take 100 mcg by mouth daily., Disp: , Rfl:   Social History   Tobacco Use  Smoking Status Former   Packs/day: 0.50   Years: 15.00   Pack years: 7.50   Types: Cigarettes   Quit date: 11/2020   Years since quitting: 0.0  Smokeless Tobacco Never    No Known Allergies Objective:  There were no vitals filed for this visit. There is no height or weight on file to calculate BMI. Constitutional Well developed.  Well nourished.  Vascular Dorsalis pedis pulses palpable bilaterally. Posterior tibial pulses palpable bilaterally. Capillary refill normal to all digits.  No cyanosis or clubbing noted. Pedal hair growth normal.  Neurologic Normal speech. Oriented to person, place, and time. Epicritic sensation to light touch grossly present bilaterally.  Dermatologic Hyperkeratotic lesion with underlying hammertoe contracture semiflexible in nature noted to bilateral fifth digit.  Adductovarus rotation noted.  Orthopedic: Normal joint ROM without pain or crepitus bilaterally. No visible deformities. No bony tenderness.   Radiographs: None Assessment:   1. Hammertoe of right foot   2. Hammertoe of left foot    3. Porokeratosis     Plan:  Patient was evaluated and treated and all questions answered.  Bilateral fifth digit hammertoe contracture with adductovarus deformity with lateral corns in the setting of diabetes uncontrolled last A1c of 9.4 -I explained to the patient the etiology of hammertoe contracture versus treatment options were extensively discussed.  I discussed shoe gear modification again in extensive detail.  I debrided the lesion down to healthy striated tissue no complications noted no pinpoint bleeding noted.  I encouraged her to continue wearing toe protectors.  She states understanding I will continue to debridement till her sugars have been controlled after that we can discuss surgical options.  Patient states understanding  No follow-ups on file.

## 2020-12-11 ENCOUNTER — Other Ambulatory Visit (HOSPITAL_COMMUNITY): Payer: Self-pay

## 2020-12-11 ENCOUNTER — Telehealth (HOSPITAL_COMMUNITY): Payer: Self-pay

## 2020-12-11 NOTE — Telephone Encounter (Signed)
Transitions of Care Pharmacy  ° °Call attempted for a pharmacy transitions of care follow-up. HIPAA appropriate voicemail was left with call back information provided.  ° °Call attempt #1. Will follow-up in 2-3 days.  °  °

## 2020-12-12 ENCOUNTER — Other Ambulatory Visit (HOSPITAL_COMMUNITY): Payer: Self-pay

## 2020-12-12 ENCOUNTER — Telehealth (HOSPITAL_COMMUNITY): Payer: Self-pay

## 2020-12-12 NOTE — Telephone Encounter (Signed)
Pharmacy Transitions of Care Follow-up Telephone Call  Date of discharge: 12/07/20  Discharge Diagnosis: NSTEMI  How have you been since you were released from the hospital?  Patient doing well since discharge. No questions about meds at this time.  Medication changes made at discharge:      START taking: Aspirin Low Dose (aspirin)  Brilinta (ticagrelor)  metoprolol succinate (TOPROL-XL)  rosuvastatin (CRESTOR)  STOP taking: fluconazole 150 MG tablet (DIFLUCAN)  losartan 50 MG tablet (COZAAR)   Medication changes verified by the patient? Yes    Medication Accessibility:  Home Pharmacy:  [facility]  Was the patient provided with refills on discharged medications? Yes   Have all prescriptions been transferred from [facility] to home pharmacy?  Yes  Is the patient able to afford medications? Patient has Advance insurance    Medication Review:  TICAGRELOR (BRILINTA) Ticagrelor 90 mg BID initiated on 12/07/20.  - Educated patient on expected duration of therapy of aspirin with ticagrelor.  - Discussed importance of taking medication around the same time every day, - Advised patient of medications to avoid (NSAIDs, aspirin maintenance doses>100 mg daily) - Educated that Tylenol (acetaminophen) will be the preferred analgesic to prevent risk of bleeding  - Emphasized importance of monitoring for signs and symptoms of bleeding (abnormal bruising, prolonged bleeding, nose bleeds, bleeding from gums, discolored urine, black tarry stools)  - Educated patient to notify doctor if shortness of breath or abnormal heartbeat occur - Advised patient to alert all providers of antiplatelet therapy prior to starting a new medication or having a procedure   Follow-up Appointments:  PCP Hospital f/u appt confirmed? Dr. Ahmed Prima is PCP, appointment 12/13/20  Specialist Hospital f/u appt confirmed? Scheduled to see Dr. Virgina Jock on 12/10/20 @ Cardiology.   If their condition  worsens, is the pt aware to call PCP or go to the Emergency Dept.? Yes  Final Patient Assessment: Patient has refills at home pharmacy and follow up scheduled

## 2020-12-16 ENCOUNTER — Other Ambulatory Visit (HOSPITAL_COMMUNITY): Payer: Self-pay | Admitting: Cardiology

## 2020-12-16 DIAGNOSIS — N179 Acute kidney failure, unspecified: Secondary | ICD-10-CM

## 2020-12-17 ENCOUNTER — Ambulatory Visit: Payer: Self-pay | Admitting: Cardiology

## 2020-12-17 ENCOUNTER — Other Ambulatory Visit: Payer: Self-pay

## 2020-12-17 DIAGNOSIS — Z006 Encounter for examination for normal comparison and control in clinical research program: Secondary | ICD-10-CM

## 2020-12-17 LAB — BASIC METABOLIC PANEL
BUN/Creatinine Ratio: 7 — ABNORMAL LOW (ref 9–23)
BUN: 11 mg/dL (ref 6–24)
CO2: 24 mmol/L (ref 20–29)
Calcium: 9.4 mg/dL (ref 8.7–10.2)
Chloride: 98 mmol/L (ref 96–106)
Creatinine, Ser: 1.47 mg/dL — ABNORMAL HIGH (ref 0.57–1.00)
Glucose: 195 mg/dL — ABNORMAL HIGH (ref 65–99)
Potassium: 4.1 mmol/L (ref 3.5–5.2)
Sodium: 139 mmol/L (ref 134–144)
eGFR: 41 mL/min/{1.73_m2} — ABNORMAL LOW (ref >59)

## 2020-12-17 NOTE — Progress Notes (Signed)
Chief Complaint  Patient presents with   Research      ICD-10-CM   1. Research subject EMPACT AMI Study: EMPAgliflozin in CHF patients post acute MI  Z00.6       Adrian Prows, MD, [facility] 12/17/2020, 2:47 PM Office: [phone] Fax: [phone] Pager: [phone]

## 2020-12-18 NOTE — Progress Notes (Signed)
Also, recommend increased water intake.  Thanks MJP

## 2020-12-18 NOTE — Progress Notes (Signed)
Creatinine has increased after increasing Entresto from 24-26 mg twice/day to 49-51 mg twice/day.  Recommend the following: Hold Entresto on 8/24 and 8/25. Resume 24/26 mg twice/day on 8/26.  Check bloodwork on 8/28.  Regards, Dr. Virgina Jock

## 2020-12-18 NOTE — Addendum Note (Signed)
Addended by: Nigel Mormon on: 12/18/2020 09:52 AM   Modules accepted: Orders

## 2020-12-19 NOTE — Progress Notes (Signed)
Spoke with patient and patient daughter, she has already taken her medication for today. So I advised to Sonia Baller for 2 days (26th and 27th), then on the 28th, start Entresto 24/'26mg'$  twice a day. On the 30th, go have bloodwork done. Also advised for patient to increase water intake. Patient daughter wrote down all instructions for patient.

## 2020-12-24 ENCOUNTER — Other Ambulatory Visit (HOSPITAL_COMMUNITY): Payer: Self-pay | Admitting: Cardiology

## 2020-12-24 DIAGNOSIS — N179 Acute kidney failure, unspecified: Secondary | ICD-10-CM

## 2020-12-25 LAB — BASIC METABOLIC PANEL
BUN/Creatinine Ratio: 6 — ABNORMAL LOW (ref 9–23)
BUN: 11 mg/dL (ref 6–24)
CO2: 25 mmol/L (ref 20–29)
Calcium: 9.4 mg/dL (ref 8.7–10.2)
Chloride: 99 mmol/L (ref 96–106)
Creatinine, Ser: 1.82 mg/dL — ABNORMAL HIGH (ref 0.57–1.00)
Glucose: 243 mg/dL — ABNORMAL HIGH (ref 65–99)
Potassium: 3.8 mmol/L (ref 3.5–5.2)
Sodium: 138 mmol/L (ref 134–144)
eGFR: 32 mL/min/{1.73_m2} — ABNORMAL LOW (ref >59)

## 2020-12-26 NOTE — Progress Notes (Signed)
Creatinine has further increased. Recommend stopping Entresto completely. Encourage liberal fluid intake.  Will repeat BMP in 1 week. If not improves, recommend seeing nephrology.  Regards, Dr. Virgina Jock

## 2020-12-26 NOTE — Addendum Note (Signed)
Addended by: Nigel Mormon on: 12/26/2020 03:32 PM   Modules accepted: Orders

## 2020-12-27 NOTE — Progress Notes (Signed)
Called pt regarding results. Pt voiced understanding. Patient stated she told cardiac rehab that she does not want to do it and now she has changed her mind. Do we know the phone number to contact them?

## 2020-12-27 NOTE — Progress Notes (Signed)
Monica Bell, can you help?  Thanks MJP

## 2020-12-27 NOTE — Progress Notes (Signed)
No one is here but I will make a note and wait until Tuesday. Thank you.

## 2020-12-31 NOTE — Progress Notes (Signed)
Done

## 2021-01-02 ENCOUNTER — Other Ambulatory Visit (HOSPITAL_COMMUNITY): Payer: Self-pay | Admitting: Cardiology

## 2021-01-03 LAB — BASIC METABOLIC PANEL
BUN/Creatinine Ratio: 7 — ABNORMAL LOW (ref 9–23)
BUN: 9 mg/dL (ref 6–24)
CO2: 26 mmol/L (ref 20–29)
Calcium: 9.1 mg/dL (ref 8.7–10.2)
Chloride: 101 mmol/L (ref 96–106)
Creatinine, Ser: 1.26 mg/dL — ABNORMAL HIGH (ref 0.57–1.00)
Glucose: 269 mg/dL — ABNORMAL HIGH (ref 65–99)
Potassium: 4.1 mmol/L (ref 3.5–5.2)
Sodium: 139 mmol/L (ref 134–144)
eGFR: 49 mL/min/{1.73_m2} — ABNORMAL LOW (ref >59)

## 2021-01-06 ENCOUNTER — Other Ambulatory Visit: Payer: Self-pay | Admitting: Cardiology

## 2021-01-15 ENCOUNTER — Ambulatory Visit: Payer: BC Managed Care – PPO

## 2021-01-15 ENCOUNTER — Other Ambulatory Visit: Payer: Self-pay

## 2021-01-15 DIAGNOSIS — I25118 Atherosclerotic heart disease of native coronary artery with other forms of angina pectoris: Secondary | ICD-10-CM

## 2021-01-17 ENCOUNTER — Other Ambulatory Visit: Payer: Self-pay

## 2021-01-17 ENCOUNTER — Encounter: Payer: BC Managed Care – PPO | Attending: Cardiology | Admitting: *Deleted

## 2021-01-17 DIAGNOSIS — Z955 Presence of coronary angioplasty implant and graft: Secondary | ICD-10-CM

## 2021-01-17 DIAGNOSIS — I214 Non-ST elevation (NSTEMI) myocardial infarction: Secondary | ICD-10-CM

## 2021-01-17 NOTE — Progress Notes (Signed)
Initial telephone orientation completed. Diagnosis can be found in [facility] 8/11. EP orientation scheduled for Thursday 10/6 at 3pm.

## 2021-01-23 ENCOUNTER — Other Ambulatory Visit: Payer: Self-pay

## 2021-01-23 ENCOUNTER — Encounter: Payer: Self-pay | Admitting: Cardiology

## 2021-01-23 ENCOUNTER — Ambulatory Visit: Payer: BC Managed Care – PPO | Admitting: Cardiology

## 2021-01-23 VITALS — BP 147/70 | HR 77 | Temp 98.0°F | Ht 71.0 in | Wt 186.0 lb

## 2021-01-23 DIAGNOSIS — I10 Essential (primary) hypertension: Secondary | ICD-10-CM

## 2021-01-23 DIAGNOSIS — I5020 Unspecified systolic (congestive) heart failure: Secondary | ICD-10-CM

## 2021-01-23 DIAGNOSIS — E1165 Type 2 diabetes mellitus with hyperglycemia: Secondary | ICD-10-CM | POA: Insufficient documentation

## 2021-01-23 DIAGNOSIS — I25118 Atherosclerotic heart disease of native coronary artery with other forms of angina pectoris: Secondary | ICD-10-CM

## 2021-01-23 MED ORDER — ISOSORB DINITRATE-HYDRALAZINE 20-37.5 MG PO TABS: 1.0000 | ORAL_TABLET | Freq: Three times a day (TID) | ORAL | 3 refills | Status: DC

## 2021-01-23 MED ORDER — IVABRADINE HCL 5 MG PO TABS: 5.0000 mg | ORAL_TABLET | Freq: Two times a day (BID) | ORAL | 2 refills | Status: DC

## 2021-01-23 NOTE — Progress Notes (Signed)
Patient referred by [name], NP for coronary artery disease  Subjective:   Monica Bell, female    DOB: 1960/10/07, 60 y.o.   MRN: [id number]  Chief Complaint  Patient presents with   Coronary Artery Disease   Follow-up   Results     HPI  60 year old African-American female with hypertension, hyperlipidemia, type 2 diabetes mellitus, tobacco dependence, CAD, HFrEF (EF 40-45%)  Patient still feels very fatigued and tired, has exertional dyspnea.  She feels generalized soreness soreness in her chest, but very different from her chest pain at the time of her MI.  Entresto had to be discontinued due to acute kidney injury.  Blood pressure remains elevated.  Reviewed recent stress test results with the patient, just below.  Current Outpatient Medications on File Prior to Visit  Medication Sig Dispense Refill   Calcium Carbonate-Vitamin D (CALTRATE 600+D PO) Take 1 tablet by mouth daily.     cetirizine (ZYRTEC) 10 MG tablet Take 10 mg by mouth daily.     cholecalciferol (VITAMIN D3) 25 MCG (1000 UNIT) tablet Take 1,000 Units by mouth daily.     GNP ASPIRIN LOW DOSE 81 MG EC tablet TAKE 1 TABLET BY MOUTH ONCE DAILY. SWALLOW WHOLE. 90 tablet 0   levothyroxine (SYNTHROID) 137 MCG tablet Take 137 mcg by mouth daily.     magnesium oxide (MAG-OX) 400 MG tablet Take 400 mg by mouth daily.     metFORMIN (GLUCOPHAGE) 1000 MG tablet Take 1,000 mg by mouth 2 (two) times daily with a meal.     metoprolol succinate (TOPROL-XL) 50 MG 24 hr tablet Take 1 tablet (50 mg total) by mouth daily. Take with or immediately following a meal. 30 tablet 0   Multiple Minerals-Vitamins (CAL-MAG-ZINC-D PO) Take by mouth.     omeprazole (PRILOSEC) 20 MG capsule Take 20 mg by mouth daily.     rosuvastatin (CRESTOR) 40 MG tablet Take 1 tablet (40 mg total) by mouth daily. 30 tablet 0   ticagrelor (BRILINTA) 90 MG TABS tablet Take 1 tablet (90 mg total) by mouth 2 (two) times daily. 60 tablet 2    TRESIBA FLEXTOUCH 200 UNIT/ML FlexTouch Pen Inject 45 Units into the skin daily.     vitamin B-12 (CYANOCOBALAMIN) 100 MCG tablet Take 100 mcg by mouth daily.     No current facility-administered medications on file prior to visit.    Cardiovascular and other pertinent studies:  Lexiscan Sestamibi stress test 01/15/2021: Lexiscan nuclear stress test performed using 1-day protocol. Stress EKG is non-diagnostic, as this is pharmacological stress test. In addition, stress EKG at 78% MPHR showed sinus tachycardia, 1 mm inferolateral downsloping ST depression, that normalize 1 min into recovery.  Normal myocardial perfusion. Stress LVEF 35% with moderate global decrease in myocardial thickening and wall motion. High risk study due to low stress LVEF.  EKG 12/10/2020: Sinus rhythm 65 bpm Possible old anteroseptal infarct Inferolateral T wave inversion, nonspecific   Coronary intervention 12/06/2020: LM: Normal LAD: No significant disease Lcx: Prox thrombotic 95% stenosis, 60% ostial OM1 stenosis        Mid Lcx occlusion, likely chronic RCA: Mid 60% disease. Right-to-left collaterals to occluded Lcx   Successful percutaneous coronary intervention prox Lcx-OM1        PTCA and stent placement 3.0 X 30 mm Onyx Frontier drug-eluting  stent, post dilatation with 3.25X8 and 3.5X15 mm [place] balloons up to 20 atm   Distal embolization into small branches of OM1. Aggrastat bolus and  infusion started.  Echocardiogram 12/06/2020:  1. LVEF is depressed with hypokinesis of the distal anterior, basal  inferior and apical walls and severe hypokinesis of the distal lateral  walls. LVEF 40 to 45%. Left ventricular diastolic parameters are  consistent with Grade I diastolic dysfunction  (impaired relaxation). Elevated left atrial pressure.   2. Right ventricular systolic function is normal. The right ventricular  size is normal.   3. Soft tissue density epicardially, seen most prominently inferior to  RV.  May represent adipose tissue, cannot exclude inflammation with  consolidation. No old images to compare . a small pericardial effusion is  present.   4. The mitral valve is normal in structure. No evidence of mitral valve  regurgitation.   5. The aortic valve is normal in structure. Aortic valve regurgitation is  not visualized.   6. The inferior vena cava is normal in size with greater than 50%  respiratory variability, suggesting right atrial pressure of 3 mmHg.    Recent labs: 01/02/2021: (After discontiuing Entresto) Glucose 269, BUN/Cr 9/1.26. EGFR 49. Na/K 139/4.1.   12/24/2020: (On Entresto 24-26 mg bid) Glucose 243, BUN/Cr 11/1.82. EGFR 32. Na/K 132/3.8.   12/16/2020: (On Entresto 49-51 mg bid) Glucose 195, BUN/Cr 11/1.47. EGFR 41. Na/K 139/4.1.   12/07/2020: (On Entresto 24-26 mg bid) Glucose 180, BUN/Cr 10/0.87. EGFR >60. Na/K 136/4.4.  H/H 11.7/35.7. MCV 87. Platelets 370 HbA1C 9.4% Chol 201, TG 78, HDL 51, LDL 134 TSH N/A  Results for Monica, Bell (MRN [id number]) as of 12/10/2020 11:10  Ref. Range 12/06/2020 01:39 12/06/2020 05:03 12/06/2020 07:46 12/06/2020 10:07  Troponin I (High Sensitivity) Latest Ref Range: <18 ng/L 149 (HH) 396 (HH) 734 (HH) 1,050 (HH)     Review of Systems  Constitutional: Positive for malaise/fatigue.  Cardiovascular:  Positive for chest pain (Different from her angina at the time of MI) and dyspnea on exertion. Negative for leg swelling, palpitations and syncope.       Vitals:   01/23/21 1358  BP: (!) 147/70  Pulse: 77  Temp: 98 F (36.7 C)  SpO2: 99%      Body mass index is 25.94 kg/m. Filed Weights   01/23/21 1358  Weight: 186 lb (84.4 kg)     Objective:   Physical Exam Vitals and nursing note reviewed.  Constitutional:      General: She is not in acute distress. Neck:     Vascular: No JVD.  Cardiovascular:     Rate and Rhythm: Normal rate and regular rhythm.     Pulses: Decreased pulses.     Heart sounds:  Normal heart sounds. No murmur heard. Pulmonary:     Effort: Pulmonary effort is normal.     Breath sounds: Normal breath sounds. No wheezing or rales.  Musculoskeletal:     Right lower leg: No edema.     Left lower leg: No edema.        Assessment & Recommendations:   60 year old African-American female with hypertension, hyperlipidemia, type 2 diabetes mellitus, tobacco dependence, admitted with NSTEMI   CAD: NSTEMI 11/2020. Culprit: thrombotic 95% stenosis Prox Lcx into large OM1 Non-culprit: Mid Lcx chronic occlusion with R-to-L grade 3 collaterals                     Mid RCA 60% stenosis Recommend DAPT with Aspirin and Brilinta till 11/2021 Metoprolol succinate 50 mg daily Crestor 40 mg daily Aggressive diabetes management with PCP (A1C is 9%) No ischemia, low stress LVEF 35% (  SPECT MPI 12/2020)   HFrEF: Clinically euvolumic. LVEDP minimally elevated at 16 mmhg on cath EF 40-45% with Lcx/OM territory WMA Entresto discontinued due to AKI Continue metoprolol succinate 50 mg daily Start Bidil 20-37.5 mg tid. Encourage hydration. Added Corlanor 5 mg bid. Enrolled in EMPACT-MI clinical trial (Empaglifozin vs placebo in post MI heart failure patients).  Consider using non-SGLT2i for diabetes management.    Hypertension: As above   Hyperlipidemia: Uncontrolled. LDL 134. Added Crestor 40 mg daily.    Type 2 DM: Uncontrolled. Defer management to primary team.  Enrolled in EMPACT-MI clinical trial (Empaglifozin vs placebo in post MI heart failure patients).  Consider using non-SGLT2i for diabetes management.    Nicotine dependence: Congratulated her on cessation   Filled up FMLA paperwork   F/u in 4 weeks    Nigel Mormon, MD Pager: [phone] Office: [phone]

## 2021-01-30 ENCOUNTER — Other Ambulatory Visit: Payer: Self-pay

## 2021-01-30 ENCOUNTER — Encounter: Payer: BC Managed Care – PPO | Attending: Cardiology

## 2021-01-30 VITALS — Ht 71.0 in | Wt 184.1 lb

## 2021-01-30 DIAGNOSIS — E119 Type 2 diabetes mellitus without complications: Secondary | ICD-10-CM | POA: Diagnosis not present

## 2021-01-30 DIAGNOSIS — Z7984 Long term (current) use of oral hypoglycemic drugs: Secondary | ICD-10-CM | POA: Insufficient documentation

## 2021-01-30 DIAGNOSIS — Z955 Presence of coronary angioplasty implant and graft: Secondary | ICD-10-CM | POA: Diagnosis not present

## 2021-01-30 DIAGNOSIS — Z48812 Encounter for surgical aftercare following surgery on the circulatory system: Secondary | ICD-10-CM | POA: Insufficient documentation

## 2021-01-30 DIAGNOSIS — I214 Non-ST elevation (NSTEMI) myocardial infarction: Secondary | ICD-10-CM

## 2021-01-30 NOTE — Progress Notes (Signed)
Cardiac Individual Treatment Plan  Patient Details  Name: Monica Bell MRN: [id number] Date of Birth: November 18, 1960 Referring Provider:   Flowsheet Row Cardiac Rehab from 01/30/2021 in [facility] Cardiac and Pulmonary Rehab  Referring Provider Patwardhan       Initial Encounter Date:  Flowsheet Row Cardiac Rehab from 01/30/2021 in [facility] Cardiac and Pulmonary Rehab  Date 01/30/21       Visit Diagnosis: NSTEMI (non-ST elevated myocardial infarction) [facility])  Status post coronary artery stent placement  Patient's Home Medications on Admission:  Current Outpatient Medications:    Calcium Carbonate-Vitamin D (CALTRATE 600+D PO), Take 1 tablet by mouth daily., Disp: , Rfl:    cetirizine (ZYRTEC) 10 MG tablet, Take 10 mg by mouth daily., Disp: , Rfl:    cholecalciferol (VITAMIN D3) 25 MCG (1000 UNIT) tablet, Take 1,000 Units by mouth daily., Disp: , Rfl:    GNP ASPIRIN LOW DOSE 81 MG EC tablet, TAKE 1 TABLET BY MOUTH ONCE DAILY. SWALLOW WHOLE., Disp: 90 tablet, Rfl: 0   Investigational - Study Medication, Impact MI Study  Empagliflozin /or Placebo, Disp: , Rfl:    isosorbide-hydrALAZINE (BIDIL) 20-37.5 MG tablet, Take 1 tablet by mouth 3 (three) times daily., Disp: 90 tablet, Rfl: 3   ivabradine (CORLANOR) 5 MG TABS tablet, Take 1 tablet (5 mg total) by mouth 2 (two) times daily with a meal., Disp: 60 tablet, Rfl: 2   levothyroxine (SYNTHROID) 137 MCG tablet, Take 137 mcg by mouth daily., Disp: , Rfl:    magnesium oxide (MAG-OX) 400 MG tablet, Take 400 mg by mouth daily., Disp: , Rfl:    metFORMIN (GLUCOPHAGE) 1000 MG tablet, Take 1,000 mg by mouth 2 (two) times daily with a meal., Disp: , Rfl:    metoprolol succinate (TOPROL-XL) 50 MG 24 hr tablet, Take 1 tablet (50 mg total) by mouth daily. Take with or immediately following a meal., Disp: 30 tablet, Rfl: 0   Multiple Minerals-Vitamins (CAL-MAG-ZINC-D PO), Take by mouth., Disp: , Rfl:    omeprazole (PRILOSEC) 20 MG capsule, Take 20 mg by  mouth daily., Disp: , Rfl:    rosuvastatin (CRESTOR) 40 MG tablet, Take 1 tablet (40 mg total) by mouth daily., Disp: 30 tablet, Rfl: 0   ticagrelor (BRILINTA) 90 MG TABS tablet, Take 1 tablet (90 mg total) by mouth 2 (two) times daily., Disp: 60 tablet, Rfl: 2   TRESIBA FLEXTOUCH 200 UNIT/ML FlexTouch Pen, Inject 45 Units into the skin daily., Disp: , Rfl:    vitamin B-12 (CYANOCOBALAMIN) 100 MCG tablet, Take 100 mcg by mouth daily., Disp: , Rfl:   Past Medical History: Past Medical History:  Diagnosis Date   Abnormal mammogram    Allergic rhinitis due to pollen 08/01/2019   Arthritis 01/06/2019   osteoarthritis of both knees   BMI 28.0-28.9,adult 08/01/2019   Cystocele, unspecified (CODE)    Diabetes mellitus    GERD (gastroesophageal reflux disease)    Heart murmur    Hyperlipemia, mixed 12/14/2018   Hypertension    Hypothyroidism    Stress incontinence, female    Thyroid disease    Type 2 diabetes mellitus, without long-term current use of insulin ([place]) 01/06/2019   Vitamin D deficiency 01/06/2019    Tobacco Use: Social History   Tobacco Use  Smoking Status Former   Packs/day: 0.50   Years: 15.00   Pack years: 7.50   Types: Cigarettes   Quit date: 11/2020   Years since quitting: 0.1  Smokeless Tobacco Never    Labs: Recent  Review Flowsheet Data     Labs for ITP Cardiac and Pulmonary Rehab Latest Ref Rng & Units 12/06/2020   Cholestrol 0 - 200 mg/dL 201(H)   LDLCALC 0 - 99 mg/dL 134(H)   HDL >40 mg/dL 51   Trlycerides <150 mg/dL 78   Hemoglobin A1c 4.8 - 5.6 % 9.4(H)        Exercise Target Goals: Exercise Program Goal: Individual exercise prescription set using results from initial 6 min walk test and THRR while considering  patient's activity barriers and safety.   Exercise Prescription Goal: Initial exercise prescription builds to 30-45 minutes a day of aerobic activity, 2-3 days per week.  Home exercise guidelines will be given to patient during  program as part of exercise prescription that the participant will acknowledge.   Education: Aerobic Exercise: - Group verbal and visual presentation on the components of exercise prescription. Introduces F.I.T.T principle from ACSM for exercise prescriptions.  Reviews F.I.T.T. principles of aerobic exercise including progression. Written material given at graduation.   Education: Resistance Exercise: - Group verbal and visual presentation on the components of exercise prescription. Introduces F.I.T.T principle from ACSM for exercise prescriptions  Reviews F.I.T.T. principles of resistance exercise including progression. Written material given at graduation.    Education: Exercise & Equipment Safety: - Individual verbal instruction and demonstration of equipment use and safety with use of the equipment. Flowsheet Row Cardiac Rehab from 01/30/2021 in [facility] Cardiac and Pulmonary Rehab  Education need identified 01/30/21  Date 01/30/21  Educator AS  Instruction Review Code 1- Verbalizes Understanding       Education: Exercise Physiology & General Exercise Guidelines: - Group verbal and written instruction with models to review the exercise physiology of the cardiovascular system and associated critical values. Provides general exercise guidelines with specific guidelines to those with heart or lung disease.    Education: Flexibility, Balance, Mind/Body Relaxation: - Group verbal and visual presentation with interactive activity on the components of exercise prescription. Introduces F.I.T.T principle from ACSM for exercise prescriptions. Reviews F.I.T.T. principles of flexibility and balance exercise training including progression. Also discusses the mind body connection.  Reviews various relaxation techniques to help reduce and manage stress (i.e. Deep breathing, progressive muscle relaxation, and visualization). Balance handout provided to take home. Written material given at  graduation.   Activity Barriers & Risk Stratification:  Activity Barriers & Cardiac Risk Stratification - 01/17/21 1505       Activity Barriers & Cardiac Risk Stratification   Activity Barriers Joint Problems   rotator cuff injury, injections in both knees and shoulders   Cardiac Risk Stratification Moderate             6 Minute Walk:  6 Minute Walk     Row Name 01/30/21 1646         6 Minute Walk   Phase Initial     Distance 915 feet     Walk Time 6 minutes     # of Rest Breaks 0     MPH 1.73     METS 3.2     RPE 13     Perceived Dyspnea  1     VO2 Peak 11.3     Symptoms No     Resting HR 62 bpm     Resting BP 138/62     Resting Oxygen Saturation  99 %     Exercise Oxygen Saturation  during 6 min walk 98 %     Max Ex. HR 86 bpm  Max Ex. BP 186/66     2 Minute Post BP 136/64              Oxygen Initial Assessment:   Oxygen Re-Evaluation:   Oxygen Discharge (Final Oxygen Re-Evaluation):   Initial Exercise Prescription:  Initial Exercise Prescription - 01/30/21 1600       Date of Initial Exercise RX and Referring Provider   Date 01/30/21    Referring Provider Patwardhan      Treadmill   MPH 1.7    Grade 1.5    Minutes 15    METs 2.6      Recumbant Bike   Level 1    RPM 60    Minutes 15    METs 3      NuStep   Level 1    SPM 80    Minutes 15    METs 3      REL-XR   Level 1    Speed 50    Minutes 15    METs 3      Track   Laps 38    Minutes 15      Prescription Details   Frequency (times per week) 3    Duration Progress to 30 minutes of continuous aerobic without signs/symptoms of physical distress      Intensity   Ratings of Perceived Exertion 11-13    Perceived Dyspnea 0-4      Resistance Training   Training Prescription Yes    Weight 3lb    Reps 10-15             Perform Capillary Blood Glucose checks as needed.  Exercise Prescription Changes:   Exercise Prescription Changes     Row Name 01/30/21  1600             Response to Exercise   Blood Pressure (Admit) 138/62       Blood Pressure (Exercise) 186/66       Blood Pressure (Exit) 136/64       Heart Rate (Admit) 62 bpm       Heart Rate (Exercise) 86 bpm       Heart Rate (Exit) 60 bpm       Oxygen Saturation (Admit) 99 %       Oxygen Saturation (Exercise) 98 %       Rating of Perceived Exertion (Exercise) 13       Perceived Dyspnea (Exercise) 1       Symptoms none                Exercise Comments:   Exercise Goals and Review:   Exercise Goals     Row Name 01/30/21 1649             Exercise Goals   Increase Physical Activity Yes       Intervention Provide advice, education, support and counseling about physical activity/exercise needs.;Develop an individualized exercise prescription for aerobic and resistive training based on initial evaluation findings, risk stratification, comorbidities and participant's personal goals.       Expected Outcomes Short Term: Attend rehab on a regular basis to increase amount of physical activity.;Long Term: Add in home exercise to make exercise part of routine and to increase amount of physical activity.;Long Term: Exercising regularly at least 3-5 days a week.       Increase Strength and Stamina Yes       Intervention Provide advice, education, support and counseling about physical activity/exercise needs.;Develop an individualized exercise prescription  for aerobic and resistive training based on initial evaluation findings, risk stratification, comorbidities and participant's personal goals.       Expected Outcomes Short Term: Increase workloads from initial exercise prescription for resistance, speed, and METs.;Short Term: Perform resistance training exercises routinely during rehab and add in resistance training at home;Long Term: Improve cardiorespiratory fitness, muscular endurance and strength as measured by increased METs and functional capacity (6MWT)       Able to  understand and use rate of perceived exertion (RPE) scale Yes       Intervention Provide education and explanation on how to use RPE scale       Expected Outcomes Short Term: Able to use RPE daily in rehab to express subjective intensity level;Long Term:  Able to use RPE to guide intensity level when exercising independently       Able to understand and use Dyspnea scale Yes       Intervention Provide education and explanation on how to use Dyspnea scale       Expected Outcomes Short Term: Able to use Dyspnea scale daily in rehab to express subjective sense of shortness of breath during exertion;Long Term: Able to use Dyspnea scale to guide intensity level when exercising independently       Knowledge and understanding of Target Heart Rate Range (THRR) Yes       Intervention Provide education and explanation of THRR including how the numbers were predicted and where they are located for reference       Expected Outcomes Short Term: Able to state/look up THRR;Short Term: Able to use daily as guideline for intensity in rehab;Long Term: Able to use THRR to govern intensity when exercising independently       Able to check pulse independently Yes       Intervention Provide education and demonstration on how to check pulse in carotid and radial arteries.;Review the importance of being able to check your own pulse for safety during independent exercise       Expected Outcomes Short Term: Able to explain why pulse checking is important during independent exercise;Long Term: Able to check pulse independently and accurately       Understanding of Exercise Prescription Yes       Intervention Provide education, explanation, and written materials on patient's individual exercise prescription       Expected Outcomes Short Term: Able to explain program exercise prescription;Long Term: Able to explain home exercise prescription to exercise independently                Exercise Goals Re-Evaluation  :   Discharge Exercise Prescription (Final Exercise Prescription Changes):  Exercise Prescription Changes - 01/30/21 1600       Response to Exercise   Blood Pressure (Admit) 138/62    Blood Pressure (Exercise) 186/66    Blood Pressure (Exit) 136/64    Heart Rate (Admit) 62 bpm    Heart Rate (Exercise) 86 bpm    Heart Rate (Exit) 60 bpm    Oxygen Saturation (Admit) 99 %    Oxygen Saturation (Exercise) 98 %    Rating of Perceived Exertion (Exercise) 13    Perceived Dyspnea (Exercise) 1    Symptoms none             Nutrition:  Target Goals: Understanding of nutrition guidelines, daily intake of sodium 1500mg , cholesterol 200mg , calories 30% from fat and 7% or less from saturated fats, daily to have 5 or more servings of fruits and vegetables.  Education: All About Nutrition: -Group instruction provided by verbal, written material, interactive activities, discussions, models, and posters to present general guidelines for heart healthy nutrition including fat, fiber, MyPlate, the role of sodium in heart healthy nutrition, utilization of the nutrition label, and utilization of this knowledge for meal planning. Follow up email sent as well. Written material given at graduation. Flowsheet Row Cardiac Rehab from 01/30/2021 in [facility] Cardiac and Pulmonary Rehab  Education need identified 01/30/21       Biometrics:  Pre Biometrics - 01/30/21 1650       Pre Biometrics   Height 5\' 11"  (1.803 m)    Weight 184 lb 1.6 oz (83.5 kg)    BMI (Calculated) 25.69    Single Leg Stand 8.9 seconds              Nutrition Therapy Plan and Nutrition Goals:   Nutrition Assessments:  MEDIFICTS Score Key: ?70 Need to make dietary changes  40-70 Heart Healthy Diet ? 40 Therapeutic Level Cholesterol Diet  Flowsheet Row Cardiac Rehab from 01/30/2021 in [facility] Cardiac and Pulmonary Rehab  Picture Your Plate Total Score on Admission 51      Picture Your Plate Scores: <93 Unhealthy  dietary pattern with much room for improvement. 41-50 Dietary pattern unlikely to meet recommendations for good health and room for improvement. 51-60 More healthful dietary pattern, with some room for improvement.  >60 Healthy dietary pattern, although there may be some specific behaviors that could be improved.    Nutrition Goals Re-Evaluation:   Nutrition Goals Discharge (Final Nutrition Goals Re-Evaluation):   Psychosocial: Target Goals: Acknowledge presence or absence of significant depression and/or stress, maximize coping skills, provide positive support system. Participant is able to verbalize types and ability to use techniques and skills needed for reducing stress and depression.   Education: Stress, Anxiety, and Depression - Group verbal and visual presentation to define topics covered.  Reviews how body is impacted by stress, anxiety, and depression.  Also discusses healthy ways to reduce stress and to treat/manage anxiety and depression.  Written material given at graduation.   Education: Sleep Hygiene -Provides group verbal and written instruction about how sleep can affect your health.  Define sleep hygiene, discuss sleep cycles and impact of sleep habits. Review good sleep hygiene tips.    Initial Review & Psychosocial Screening:  Initial Psych Review & Screening - 01/17/21 1509       Initial Review   Current issues with Current Stress Concerns    Source of Stress Concerns Occupation      [place]? Yes   daughter     Barriers   Psychosocial barriers to participate in program There are no identifiable barriers or psychosocial needs.;The patient should benefit from training in stress management and relaxation.      Screening Interventions   Interventions Encouraged to exercise;Provide feedback about the scores to participant;To provide support and resources with identified psychosocial needs    Expected Outcomes Short Term goal:  Utilizing psychosocial counselor, staff and physician to assist with identification of specific Stressors or current issues interfering with healing process. Setting desired goal for each stressor or current issue identified.;Long Term Goal: Stressors or current issues are controlled or eliminated.;Short Term goal: Identification and review with participant of any Quality of Life or Depression concerns found by scoring the questionnaire.;Long Term goal: The participant improves quality of Life and PHQ9 Scores as seen by post scores and/or verbalization of changes  Quality of Life Scores:   Quality of Life - 01/30/21 1653       Quality of Life   Select Quality of Life      Quality of Life Scores   Health/Function Pre 17.92 %    Socioeconomic Pre 15.38 %    Psych/Spiritual Pre 19.71 %    Family Pre 22.6 %    GLOBAL Pre 18.39 %            Scores of 19 and below usually indicate a poorer quality of life in these areas.  A difference of  2-3 points is a clinically meaningful difference.  A difference of 2-3 points in the total score of the Quality of Life Index has been associated with significant improvement in overall quality of life, self-image, physical symptoms, and general health in studies assessing change in quality of life.  PHQ-9: Recent Review Flowsheet Data     Depression screen [facility] 2/9 01/30/2021   Decreased Interest 1   Down, Depressed, Hopeless 1   PHQ - 2 Score 2   Altered sleeping 2   Tired, decreased energy 3   Change in appetite 1   Feeling bad or failure about yourself  0   Moving slowly or fidgety/restless 2   Suicidal thoughts 0   PHQ-9 Score 10   Difficult doing work/chores Somewhat difficult      Interpretation of Total Score  Total Score Depression Severity:  1-4 = Minimal depression, 5-9 = Mild depression, 10-14 = Moderate depression, 15-19 = Moderately severe depression, 20-27 = Severe depression   Psychosocial Evaluation and  Intervention:  Psychosocial Evaluation - 01/17/21 1515       Psychosocial Evaluation & Interventions   Interventions Encouraged to exercise with the program and follow exercise prescription    Comments Monica Bell is still feeling very tired after her NSTEMI. She is ready to get her stamina back. 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 She will think about vocational rehab and let staff know if that is something she is interested in. Her daughter is her main support system and urging her to attend cardiac rehab.    Expected Outcomes Short; attend cardiac rehab for education and exercise. Long: develop and maintain positive self care habits.    Continue Psychosocial Services  Follow up required by staff             Psychosocial Re-Evaluation:   Psychosocial Discharge (Final Psychosocial Re-Evaluation):   Vocational Rehabilitation: Provide vocational rehab assistance to qualifying candidates.   Vocational Rehab Evaluation & Intervention:  Vocational Rehab - 01/17/21 1508       Initial Vocational Rehab Evaluation & Intervention   Assessment shows need for Vocational Rehabilitation No             Education: Education Goals: Education classes will be provided on a variety of topics geared toward better understanding of heart health and risk factor modification. Participant will state understanding/return demonstration of topics presented as noted by education test scores.  Learning Barriers/Preferences:  Learning Barriers/Preferences - 01/17/21 1508       Learning Barriers/Preferences   Learning Barriers None    Learning Preferences None             General Cardiac Education Topics:  AED/CPR: - Group verbal and written instruction with the use of models to demonstrate the basic use of the AED with the basic ABC's of resuscitation.  Anatomy and Cardiac Procedures: - Group verbal and  visual presentation and models provide information about basic cardiac anatomy and function. Reviews the testing methods done to diagnose heart disease and the outcomes of the test results. Describes the treatment choices: Medical Management, Angioplasty, or Coronary Bypass Surgery for treating various heart conditions including Myocardial Infarction, Angina, Valve Disease, and Cardiac Arrhythmias.  Written material given at graduation.   Medication Safety: - Group verbal and visual instruction to review commonly prescribed medications for heart and lung disease. Reviews the medication, class of the drug, and side effects. Includes the steps to properly store meds and maintain the prescription regimen.  Written material given at graduation.   Intimacy: - Group verbal instruction through game format to discuss how heart and lung disease can affect sexual intimacy. Written material given at graduation..   Know Your Numbers and Heart Failure: - Group verbal and visual instruction to discuss disease risk factors for cardiac and pulmonary disease and treatment options.  Reviews associated critical values for Overweight/Obesity, Hypertension, Cholesterol, and Diabetes.  Discusses basics of heart failure: signs/symptoms and treatments.  Introduces Heart Failure Zone chart for action plan for heart failure.  Written material given at graduation.   Infection Prevention: - Provides verbal and written material to individual with discussion of infection control including proper hand washing and proper equipment cleaning during exercise session. Flowsheet Row Cardiac Rehab from 01/30/2021 in [facility] Cardiac and Pulmonary Rehab  Date 01/30/21  Educator AS  Instruction Review Code 1- Verbalizes Understanding       Falls Prevention: - Provides verbal and written material to individual with discussion of falls prevention and safety. Flowsheet Row Cardiac Rehab from 01/30/2021 in [facility] Cardiac and Pulmonary Rehab   Date 01/30/21  Educator AS  Instruction Review Code 1- Verbalizes Understanding       Other: -Provides group and verbal instruction on various topics (see comments)   Knowledge Questionnaire Score:   Core Components/Risk Factors/Patient Goals at Admission:  Personal Goals and Risk Factors at Admission - 01/30/21 1652       Core Components/Risk Factors/Patient Goals on Admission   Tobacco Cessation Yes    Number of packs per day Quit August 2022    Intervention Offer self-teaching materials, assist with locating and accessing local/national Quit Smoking programs, and support quit date choice.;Assist the participant in steps to quit. Provide individualized education and counseling about committing to Tobacco Cessation, relapse prevention, and pharmacological support that can be provided by physician.    Expected Outcomes Short Term: Will demonstrate readiness to quit, by selecting a quit date.;Short Term: Will quit all tobacco product use, adhering to prevention of relapse plan.;Long Term: Complete abstinence from all tobacco products for at least 12 months from quit date.    Diabetes Yes    Intervention Provide education about signs/symptoms and action to take for hypo/hyperglycemia.;Provide education about proper nutrition, including hydration, and aerobic/resistive exercise prescription along with prescribed medications to achieve blood glucose in normal ranges: Fasting glucose 65-99 mg/dL    Expected Outcomes Short Term: Participant verbalizes understanding of the signs/symptoms and immediate care of hyper/hypoglycemia, proper foot care and importance of medication, aerobic/resistive exercise and nutrition plan for blood glucose control.;Long Term: Attainment of HbA1C < 7%.    Hypertension Yes    Intervention Provide education on lifestyle modifcations including regular physical activity/exercise, weight management, moderate sodium restriction and increased consumption of fresh fruit,  vegetables, and low fat dairy, alcohol moderation, and smoking cessation.;Monitor prescription use compliance.  Expected Outcomes Short Term: Continued assessment and intervention until BP is < 140/74mm HG in hypertensive participants. < 130/71mm HG in hypertensive participants with diabetes, heart failure or chronic kidney disease.;Long Term: Maintenance of blood pressure at goal levels.    Lipids Yes    Intervention Provide education and support for participant on nutrition & aerobic/resistive exercise along with prescribed medications to achieve LDL 70mg , HDL >40mg .    Expected Outcomes Short Term: Participant states understanding of desired cholesterol values and is compliant with medications prescribed. Participant is following exercise prescription and nutrition guidelines.;Long Term: Cholesterol controlled with medications as prescribed, with individualized exercise RX and with personalized nutrition plan. Value goals: LDL < 70mg , HDL > 40 mg.             Education:Diabetes - Individual verbal and written instruction to review signs/symptoms of diabetes, desired ranges of glucose level fasting, after meals and with exercise. Acknowledge that pre and post exercise glucose checks will be done for 3 sessions at entry of program. Monica Bell from 01/17/2021 in [facility] Cardiac and Pulmonary Rehab  Date 01/17/21  Educator [facility]  Instruction Review Code 1- Verbalizes Understanding       Core Components/Risk Factors/Patient Goals Review:    Core Components/Risk Factors/Patient Goals at Discharge (Final Review):    ITP Comments:  ITP Comments     Row Name 01/17/21 1517           ITP Comments Initial telephone orientation completed. Diagnosis can be found in [facility] 8/11. EP orientation scheduled for Thursday 10/6 at 3pm.                Comments: initial ITP

## 2021-01-30 NOTE — Patient Instructions (Addendum)
Patient Instructions  Patient Details  Name: Monica Bell MRN: [id number] Date of Birth: [birth year] Referring Provider:  Nigel Mormon, MD  Below are your personal goals for exercise, nutrition, and risk factors. Our goal is to help you stay on track towards obtaining and maintaining these goals. We will be discussing your progress on these goals with you throughout the program.  Initial Exercise Prescription:  Initial Exercise Prescription - 01/30/21 1600       Date of Initial Exercise RX and Referring Provider   Date 01/30/21    Referring Provider Patwardhan      Treadmill   MPH 1.7    Grade 1.5    Minutes 15    METs 2.6      Recumbant Bike   Level 1    RPM 60    Minutes 15    METs 3      NuStep   Level 1    SPM 80    Minutes 15    METs 3      REL-XR   Level 1    Speed 50    Minutes 15    METs 3      Track   Laps 38    Minutes 15      Prescription Details   Frequency (times per week) 3    Duration Progress to 30 minutes of continuous aerobic without signs/symptoms of physical distress      Intensity   THRR 40-80% of Max Heartrate 95-139    Ratings of Perceived Exertion 11-13    Perceived Dyspnea 0-4      Resistance Training   Training Prescription Yes    Weight 3lb    Reps 10-15             Exercise Goals: Frequency: Be able to perform aerobic exercise two to three times per week in program working toward 2-5 days per week of home exercise.  Intensity: Work with a perceived exertion of 11 (fairly light) - 15 (hard) while following your exercise prescription.  We will make changes to your prescription with you as you progress through the program.   Duration: Be able to do 30 to 45 minutes of continuous aerobic exercise in addition to a 5 minute warm-up and a 5 minute cool-down routine.   Nutrition Goals: Your personal nutrition goals will be established when you do your nutrition analysis with the dietician.  The following are  general nutrition guidelines to follow: Cholesterol < 200mg /day Sodium < 1500mg /day Fiber: Women over 50 yrs - 21 grams per day  Personal Goals:  Personal Goals and Risk Factors at Admission - 01/30/21 1652       Core Components/Risk Factors/Patient Goals on Admission   Tobacco Cessation Yes    Number of packs per day Quit August 2022    Intervention Offer self-teaching materials, assist with locating and accessing local/national Quit Smoking programs, and support quit date choice.;Assist the participant in steps to quit. Provide individualized education and counseling about committing to Tobacco Cessation, relapse prevention, and pharmacological support that can be provided by physician.    Expected Outcomes Short Term: Will demonstrate readiness to quit, by selecting a quit date.;Short Term: Will quit all tobacco product use, adhering to prevention of relapse plan.;Long Term: Complete abstinence from all tobacco products for at least 12 months from quit date.    Diabetes Yes    Intervention Provide education about signs/symptoms and action to take for hypo/hyperglycemia.;Provide education about proper nutrition,  including hydration, and aerobic/resistive exercise prescription along with prescribed medications to achieve blood glucose in normal ranges: Fasting glucose 65-99 mg/dL    Expected Outcomes Short Term: Participant verbalizes understanding of the signs/symptoms and immediate care of hyper/hypoglycemia, proper foot care and importance of medication, aerobic/resistive exercise and nutrition plan for blood glucose control.;Long Term: Attainment of HbA1C < 7%.    Hypertension Yes    Intervention Provide education on lifestyle modifcations including regular physical activity/exercise, weight management, moderate sodium restriction and increased consumption of fresh fruit, vegetables, and low fat dairy, alcohol moderation, and smoking cessation.;Monitor prescription use compliance.    Expected  Outcomes Short Term: Continued assessment and intervention until BP is < 140/63mm HG in hypertensive participants. < 130/72mm HG in hypertensive participants with diabetes, heart failure or chronic kidney disease.;Long Term: Maintenance of blood pressure at goal levels.    Lipids Yes    Intervention Provide education and support for participant on nutrition & aerobic/resistive exercise along with prescribed medications to achieve LDL 70mg , HDL >40mg .    Expected Outcomes Short Term: Participant states understanding of desired cholesterol values and is compliant with medications prescribed. Participant is following exercise prescription and nutrition guidelines.;Long Term: Cholesterol controlled with medications as prescribed, with individualized exercise RX and with personalized nutrition plan. Value goals: LDL < 70mg , HDL > 40 mg.             Tobacco Use Initial Evaluation: Social History   Tobacco Use  Smoking Status Former   Packs/day: 0.50   Years: 15.00   Pack years: 7.50   Types: Cigarettes   Quit date: 11/2020   Years since quitting: 0.1  Smokeless Tobacco Never    Exercise Goals and Review:  Exercise Goals     Row Name 01/30/21 1649             Exercise Goals   Increase Physical Activity Yes       Intervention Provide advice, education, support and counseling about physical activity/exercise needs.;Develop an individualized exercise prescription for aerobic and resistive training based on initial evaluation findings, risk stratification, comorbidities and participant's personal goals.       Expected Outcomes Short Term: Attend rehab on a regular basis to increase amount of physical activity.;Long Term: Add in home exercise to make exercise part of routine and to increase amount of physical activity.;Long Term: Exercising regularly at least 3-5 days a week.       Increase Strength and Stamina Yes       Intervention Provide advice, education, support and counseling about  physical activity/exercise needs.;Develop an individualized exercise prescription for aerobic and resistive training based on initial evaluation findings, risk stratification, comorbidities and participant's personal goals.       Expected Outcomes Short Term: Increase workloads from initial exercise prescription for resistance, speed, and METs.;Short Term: Perform resistance training exercises routinely during rehab and add in resistance training at home;Long Term: Improve cardiorespiratory fitness, muscular endurance and strength as measured by increased METs and functional capacity (6MWT)       Able to understand and use rate of perceived exertion (RPE) scale Yes       Intervention Provide education and explanation on how to use RPE scale       Expected Outcomes Short Term: Able to use RPE daily in rehab to express subjective intensity level;Long Term:  Able to use RPE to guide intensity level when exercising independently       Able to understand and use Dyspnea scale Yes  Intervention Provide education and explanation on how to use Dyspnea scale       Expected Outcomes Short Term: Able to use Dyspnea scale daily in rehab to express subjective sense of shortness of breath during exertion;Long Term: Able to use Dyspnea scale to guide intensity level when exercising independently       Knowledge and understanding of Target Heart Rate Range (THRR) Yes       Intervention Provide education and explanation of THRR including how the numbers were predicted and where they are located for reference       Expected Outcomes Short Term: Able to state/look up THRR;Short Term: Able to use daily as guideline for intensity in rehab;Long Term: Able to use THRR to govern intensity when exercising independently       Able to check pulse independently Yes       Intervention Provide education and demonstration on how to check pulse in carotid and radial arteries.;Review the importance of being able to check your own  pulse for safety during independent exercise       Expected Outcomes Short Term: Able to explain why pulse checking is important during independent exercise;Long Term: Able to check pulse independently and accurately       Understanding of Exercise Prescription Yes       Intervention Provide education, explanation, and written materials on patient's individual exercise prescription       Expected Outcomes Short Term: Able to explain program exercise prescription;Long Term: Able to explain home exercise prescription to exercise independently                Copy of goals given to participant.

## 2021-02-03 ENCOUNTER — Other Ambulatory Visit: Payer: Self-pay

## 2021-02-03 DIAGNOSIS — I214 Non-ST elevation (NSTEMI) myocardial infarction: Secondary | ICD-10-CM

## 2021-02-03 DIAGNOSIS — Z48812 Encounter for surgical aftercare following surgery on the circulatory system: Secondary | ICD-10-CM | POA: Diagnosis not present

## 2021-02-03 DIAGNOSIS — Z955 Presence of coronary angioplasty implant and graft: Secondary | ICD-10-CM

## 2021-02-03 LAB — GLUCOSE, CAPILLARY
Glucose-Capillary: 296 mg/dL — ABNORMAL HIGH (ref 70–99)
Glucose-Capillary: 213 mg/dL — ABNORMAL HIGH (ref 70–99)

## 2021-02-03 NOTE — Progress Notes (Signed)
Daily Session Note  Patient Details  Name: Monica Bell MRN: [id number] Date of Birth: 11/26/1960 Referring Provider:   Flowsheet Row Cardiac Rehab from 01/30/2021 in [facility] Cardiac and Pulmonary Rehab  Referring Provider Patwardhan       Encounter Date: 02/03/2021  Check In:  Session Check In - 02/03/21 1424       Check-In   Supervising physician immediately available to respond to emergencies See telemetry face sheet for immediately available ER MD    Location [facility]-Cardiac & Pulmonary Rehab    Staff Present Birdie Sons, MPA, Nino Glow, MS, ASCM CEP, Exercise Physiologist;Joseph Tessie Fass, [place]    Virtual Visit No    Medication changes reported     No    Fall or balance concerns reported    No    Warm-up and Cool-down Performed on first and last piece of equipment    Resistance Training Performed Yes    VAD Patient? No    PAD/SET Patient? No      Pain Assessment   Currently in Pain? No/denies                Social History   Tobacco Use  Smoking Status Former   Packs/day: 0.50   Years: 15.00   Pack years: 7.50   Types: Cigarettes   Quit date: 11/2020   Years since quitting: 0.1  Smokeless Tobacco Never    Goals Met:  Independence with exercise equipment Exercise tolerated well No report of concerns or symptoms today Strength training completed today  Goals Unmet:  Not Applicable  Comments: First full day of exercise!  Patient was oriented to gym and equipment including functions, settings, policies, and procedures.  Patient's individual exercise prescription and treatment plan were reviewed.  All starting workloads were established based on the results of the 6 minute walk test done at initial orientation visit.  The plan for exercise progression was also introduced and progression will be customized based on patient's performance and goals.    Dr. Emily Filbert is Medical Director for [place].  Dr. Ottie Glazier is Medical Director for [facility] Pulmonary Rehabilitation.

## 2021-02-06 ENCOUNTER — Encounter: Payer: BC Managed Care – PPO | Admitting: *Deleted

## 2021-02-06 ENCOUNTER — Other Ambulatory Visit: Payer: Self-pay

## 2021-02-06 DIAGNOSIS — Z48812 Encounter for surgical aftercare following surgery on the circulatory system: Secondary | ICD-10-CM | POA: Diagnosis not present

## 2021-02-06 DIAGNOSIS — I214 Non-ST elevation (NSTEMI) myocardial infarction: Secondary | ICD-10-CM

## 2021-02-06 DIAGNOSIS — Z955 Presence of coronary angioplasty implant and graft: Secondary | ICD-10-CM

## 2021-02-06 LAB — GLUCOSE, CAPILLARY
Glucose-Capillary: 188 mg/dL — ABNORMAL HIGH (ref 70–99)
Glucose-Capillary: 159 mg/dL — ABNORMAL HIGH (ref 70–99)

## 2021-02-06 NOTE — Progress Notes (Signed)
Daily Session Note  Patient Details  Name: Monica Bell MRN: [id number] Date of Birth: February 06, 1961 Referring Provider:   Flowsheet Row Cardiac Rehab from 01/30/2021 in [facility] Cardiac and Pulmonary Rehab  Referring Provider Patwardhan       Encounter Date: 02/06/2021  Check In:  Session Check In - 02/06/21 1409       Check-In   Supervising physician immediately available to respond to emergencies See telemetry face sheet for immediately available ER MD    Location [facility]-Cardiac & Pulmonary Rehab    Staff Present Renita Papa, RN BSN;Joseph Buckhannon, RCP,RRT,BSRT;Jessica [place], [place], RCEP, CCRP, CCET    Virtual Visit No    Medication changes reported     No    Fall or balance concerns reported    No    Warm-up and Cool-down Performed on first and last piece of equipment    Resistance Training Performed Yes    VAD Patient? No    PAD/SET Patient? No      Pain Assessment   Currently in Pain? No/denies                Social History   Tobacco Use  Smoking Status Former   Packs/day: 0.50   Years: 15.00   Pack years: 7.50   Types: Cigarettes   Quit date: 11/2020   Years since quitting: 0.2  Smokeless Tobacco Never    Goals Met:  Independence with exercise equipment Exercise tolerated well No report of concerns or symptoms today Strength training completed today  Goals Unmet:  Not Applicable  Comments: Pt able to follow exercise prescription today without complaint.  Will continue to monitor for progression.    Dr. Emily Filbert is Medical Director for [place].  Dr. Ottie Glazier is Medical Director for [facility] Pulmonary Rehabilitation.

## 2021-02-08 ENCOUNTER — Other Ambulatory Visit: Payer: Self-pay | Admitting: Cardiology

## 2021-02-10 ENCOUNTER — Other Ambulatory Visit: Payer: Self-pay

## 2021-02-10 DIAGNOSIS — I214 Non-ST elevation (NSTEMI) myocardial infarction: Secondary | ICD-10-CM

## 2021-02-10 DIAGNOSIS — Z955 Presence of coronary angioplasty implant and graft: Secondary | ICD-10-CM

## 2021-02-10 DIAGNOSIS — Z48812 Encounter for surgical aftercare following surgery on the circulatory system: Secondary | ICD-10-CM | POA: Diagnosis not present

## 2021-02-10 LAB — GLUCOSE, CAPILLARY
Glucose-Capillary: 156 mg/dL — ABNORMAL HIGH (ref 70–99)
Glucose-Capillary: 153 mg/dL — ABNORMAL HIGH (ref 70–99)

## 2021-02-10 NOTE — Progress Notes (Signed)
Daily Session Note  Patient Details  Name: Monica Bell MRN: [id number] Date of Birth: [birth year] Referring Provider:   Flowsheet Row Cardiac Rehab from 01/30/2021 in [facility] Cardiac and Pulmonary Rehab  Referring Provider Patwardhan       Encounter Date: 02/10/2021  Check In:  Session Check In - 02/10/21 1415       Check-In   Supervising physician immediately available to respond to emergencies See telemetry face sheet for immediately available ER MD    Location [facility]-Cardiac & Pulmonary Rehab    Staff Present Birdie Sons, MPA, Nino Glow, MS, ASCM CEP, Exercise Physiologist;Joseph Tessie Fass, [place]    Virtual Visit No    Medication changes reported     No    Fall or balance concerns reported    No    Warm-up and Cool-down Performed on first and last piece of equipment    Resistance Training Performed Yes    VAD Patient? No    PAD/SET Patient? No      Pain Assessment   Currently in Pain? No/denies                Social History   Tobacco Use  Smoking Status Former   Packs/day: 0.50   Years: 15.00   Pack years: 7.50   Types: Cigarettes   Quit date: 11/2020   Years since quitting: 0.2  Smokeless Tobacco Never    Goals Met:  Independence with exercise equipment Exercise tolerated well No report of concerns or symptoms today Strength training completed today  Goals Unmet:  Not Applicable  Comments: Pt able to follow exercise prescription today without complaint.  Will continue to monitor for progression.    Dr. Emily Filbert is Medical Director for [place].  Dr. Ottie Glazier is Medical Director for [facility] Pulmonary Rehabilitation.

## 2021-02-12 ENCOUNTER — Other Ambulatory Visit: Payer: Self-pay

## 2021-02-12 DIAGNOSIS — I214 Non-ST elevation (NSTEMI) myocardial infarction: Secondary | ICD-10-CM

## 2021-02-12 DIAGNOSIS — Z48812 Encounter for surgical aftercare following surgery on the circulatory system: Secondary | ICD-10-CM | POA: Diagnosis not present

## 2021-02-12 DIAGNOSIS — Z955 Presence of coronary angioplasty implant and graft: Secondary | ICD-10-CM

## 2021-02-12 NOTE — Progress Notes (Signed)
Daily Session Note  Patient Details  Name: Monica Bell MRN: [id number] Date of Birth: 11-13-1960 Referring Provider:   Flowsheet Row Cardiac Rehab from 01/30/2021 in [facility] Cardiac and Pulmonary Rehab  Referring Provider Patwardhan       Encounter Date: 02/12/2021  Check In:  Session Check In - 02/12/21 1408       Check-In   Supervising physician immediately available to respond to emergencies See telemetry face sheet for immediately available ER MD    Location [facility]-Cardiac & Pulmonary Rehab    Staff Present Birdie Sons, MPA, RN;Jessica Luan [place], MA, RCEP, CCRP, CCET;Joseph Winder, RCP,RRT,BSRT;Melissa [place], [place], LDN    Virtual Visit No    Medication changes reported     No    Fall or balance concerns reported    No    Warm-up and Cool-down Performed on first and last piece of equipment    Resistance Training Performed Yes    VAD Patient? No    PAD/SET Patient? No      Pain Assessment   Currently in Pain? No/denies                Social History   Tobacco Use  Smoking Status Former   Packs/day: 0.50   Years: 15.00   Pack years: 7.50   Types: Cigarettes   Quit date: 11/2020   Years since quitting: 0.2  Smokeless Tobacco Never    Goals Met:  Independence with exercise equipment Exercise tolerated well No report of concerns or symptoms today Strength training completed today  Goals Unmet:  Not Applicable  Comments: Pt able to follow exercise prescription today without complaint.  Will continue to monitor for progression.    Dr. Emily Filbert is Medical Director for [place].  Dr. Ottie Glazier is Medical Director for [facility] Pulmonary Rehabilitation.

## 2021-02-17 ENCOUNTER — Other Ambulatory Visit: Payer: Self-pay

## 2021-02-17 DIAGNOSIS — I214 Non-ST elevation (NSTEMI) myocardial infarction: Secondary | ICD-10-CM

## 2021-02-17 DIAGNOSIS — Z48812 Encounter for surgical aftercare following surgery on the circulatory system: Secondary | ICD-10-CM | POA: Diagnosis not present

## 2021-02-17 DIAGNOSIS — Z955 Presence of coronary angioplasty implant and graft: Secondary | ICD-10-CM

## 2021-02-17 NOTE — Progress Notes (Signed)
Daily Session Note  Patient Details  Name: Monica Bell MRN: [id number] Date of Birth: 06-04-1960 Referring Provider:   Flowsheet Row Cardiac Rehab from 01/30/2021 in [facility] Cardiac and Pulmonary Rehab  Referring Provider Patwardhan       Encounter Date: 02/17/2021  Check In:  Session Check In - 02/17/21 1440       Check-In   Supervising physician immediately available to respond to emergencies See telemetry face sheet for immediately available ER MD    Staff Present Birdie Sons, MPA, Nino Glow, MS, ASCM CEP, Exercise Physiologist;Joseph Tessie Fass, [place]    Virtual Visit No    Medication changes reported     No    Fall or balance concerns reported    No    Warm-up and Cool-down Performed on first and last piece of equipment    Resistance Training Performed Yes    VAD Patient? No    PAD/SET Patient? No      Pain Assessment   Currently in Pain? No/denies                Social History   Tobacco Use  Smoking Status Former   Packs/day: 0.50   Years: 15.00   Pack years: 7.50   Types: Cigarettes   Quit date: 11/2020   Years since quitting: 0.2  Smokeless Tobacco Never    Goals Met:  Independence with exercise equipment Exercise tolerated well No report of concerns or symptoms today Strength training completed today  Goals Unmet:  Not Applicable  Comments: Pt able to follow exercise prescription today without complaint.  Will continue to monitor for progression.    Dr. Emily Filbert is Medical Director for [place].  Dr. Ottie Glazier is Medical Director for [facility] Pulmonary Rehabilitation.

## 2021-02-20 ENCOUNTER — Other Ambulatory Visit: Payer: Self-pay

## 2021-02-20 ENCOUNTER — Encounter: Payer: BC Managed Care – PPO | Admitting: *Deleted

## 2021-02-20 DIAGNOSIS — Z48812 Encounter for surgical aftercare following surgery on the circulatory system: Secondary | ICD-10-CM | POA: Diagnosis not present

## 2021-02-20 DIAGNOSIS — I214 Non-ST elevation (NSTEMI) myocardial infarction: Secondary | ICD-10-CM

## 2021-02-20 DIAGNOSIS — Z955 Presence of coronary angioplasty implant and graft: Secondary | ICD-10-CM

## 2021-02-20 NOTE — Progress Notes (Signed)
Daily Session Note  Patient Details  Name: Monica Bell MRN: [id number] Date of Birth: February 12, 1961 Referring Provider:   Flowsheet Row Cardiac Rehab from 01/30/2021 in [facility] Cardiac and Pulmonary Rehab  Referring Provider Patwardhan       Encounter Date: 02/20/2021  Check In:  Session Check In - 02/20/21 1408       Check-In   Supervising physician immediately available to respond to emergencies See telemetry face sheet for immediately available ER MD    Location [facility]-Cardiac & Pulmonary Rehab    Staff Present Renita Papa, RN BSN;Joseph [place], RCP,RRT,BSRT;Jessica [place], [place], [place], [place], CCET    Virtual Visit No    Medication changes reported     No    Fall or balance concerns reported    No    Tobacco Cessation No Change   quit 11/2020   Warm-up and Cool-down Performed on first and last piece of equipment    Resistance Training Performed Yes    VAD Patient? No    PAD/SET Patient? No      Pain Assessment   Currently in Pain? No/denies                Social History   Tobacco Use  Smoking Status Former   Packs/day: 0.50   Years: 15.00   Pack years: 7.50   Types: Cigarettes   Quit date: 11/2020   Years since quitting: 0.2  Smokeless Tobacco Never    Goals Met:  Independence with exercise equipment Exercise tolerated well No report of concerns or symptoms today Strength training completed today  Goals Unmet:  Not Applicable  Comments: Pt able to follow exercise prescription today without complaint.  Will continue to monitor for progression.    Dr. Emily Filbert is Medical Director for [place].  Dr. Ottie Glazier is Medical Director for [facility] Pulmonary Rehabilitation.

## 2021-02-24 ENCOUNTER — Other Ambulatory Visit: Payer: Self-pay

## 2021-02-24 ENCOUNTER — Ambulatory Visit: Payer: BC Managed Care – PPO | Admitting: Cardiology

## 2021-02-24 ENCOUNTER — Encounter: Payer: Self-pay | Admitting: Cardiology

## 2021-02-24 VITALS — BP 130/64 | HR 82 | Temp 98.0°F | Resp 17 | Ht 71.0 in | Wt 184.0 lb

## 2021-02-24 DIAGNOSIS — I5020 Unspecified systolic (congestive) heart failure: Secondary | ICD-10-CM

## 2021-02-24 DIAGNOSIS — I10 Essential (primary) hypertension: Secondary | ICD-10-CM

## 2021-02-24 DIAGNOSIS — I25118 Atherosclerotic heart disease of native coronary artery with other forms of angina pectoris: Secondary | ICD-10-CM

## 2021-02-24 DIAGNOSIS — E782 Mixed hyperlipidemia: Secondary | ICD-10-CM

## 2021-02-24 MED ORDER — NITROGLYCERIN 0.4 MG SL SUBL: 0.4000 mg | SUBLINGUAL_TABLET | SUBLINGUAL | 3 refills | Status: DC | PRN

## 2021-02-24 MED ORDER — AMLODIPINE BESYLATE 5 MG PO TABS: 5.0000 mg | ORAL_TABLET | Freq: Every day | ORAL | 3 refills | Status: DC

## 2021-02-24 NOTE — Progress Notes (Signed)
Patient referred by Monica Rival, NP for coronary artery disease  Subjective:   Monica Bell, female    DOB: 1960/07/02, 60 y.o.   MRN: [id number]  Chief Complaint  Patient presents with   Coronary artery disease of native artery of native heart wi   Follow-up    4 week     HPI  60 year old African-American female with hypertension, hyperlipidemia, type 2 diabetes mellitus, tobacco dependence, CAD, HFrEF (EF 40-45%)  Patient is undergoing cardiac rehabilitation program.  She reports retrosternal burning sensation with exertion.  This has been fairly stable, much less in intensity compared to her MRI.  However, this is not gone away.  Current Outpatient Medications on File Prior to Visit  Medication Sig Dispense Refill   BRILINTA 90 MG TABS tablet TAKE 1 TABLET BY MOUTH TWICE DAILY 60 tablet 0   Calcium Carbonate-Vitamin D (CALTRATE 600+D PO) Take 1 tablet by mouth daily.     cetirizine (ZYRTEC) 10 MG tablet Take 10 mg by mouth daily.     cholecalciferol (VITAMIN D3) 25 MCG (1000 UNIT) tablet Take 1,000 Units by mouth daily.     GNP ASPIRIN LOW DOSE 81 MG EC tablet TAKE 1 TABLET BY MOUTH ONCE DAILY. SWALLOW WHOLE. 90 tablet 0   Investigational - Study Medication Impact MI Study  Empagliflozin /or Placebo     isosorbide-hydrALAZINE (BIDIL) 20-37.5 MG tablet Take 1 tablet by mouth 3 (three) times daily. 90 tablet 3   ivabradine (CORLANOR) 5 MG TABS tablet Take 1 tablet (5 mg total) by mouth 2 (two) times daily with a meal. 60 tablet 2   levothyroxine (SYNTHROID) 137 MCG tablet Take 137 mcg by mouth daily.     losartan (COZAAR) 50 MG tablet Take 1 tablet by mouth daily.     magnesium oxide (MAG-OX) 400 MG tablet Take 400 mg by mouth daily.     metFORMIN (GLUCOPHAGE) 1000 MG tablet Take 1,000 mg by mouth 2 (two) times daily with a meal.     metoprolol succinate (TOPROL-XL) 50 MG 24 hr tablet Take 1 tablet (50 mg total) by mouth daily. Take with or immediately following a  meal. 30 tablet 0   Multiple Minerals-Vitamins (CAL-MAG-ZINC-D PO) Take by mouth.     omeprazole (PRILOSEC) 20 MG capsule Take 20 mg by mouth daily.     rosuvastatin (CRESTOR) 40 MG tablet Take 1 tablet (40 mg total) by mouth daily. 30 tablet 0   TRESIBA FLEXTOUCH 200 UNIT/ML FlexTouch Pen Inject 45 Units into the skin daily.     vitamin B-12 (CYANOCOBALAMIN) 100 MCG tablet Take 100 mcg by mouth daily.     No current facility-administered medications on file prior to visit.    Cardiovascular and other pertinent studies:  Lexiscan Sestamibi stress test 01/15/2021: Lexiscan nuclear stress test performed using 1-day protocol. Stress EKG is non-diagnostic, as this is pharmacological stress test. In addition, stress EKG at 78% MPHR showed sinus tachycardia, 1 mm inferolateral downsloping ST depression, that normalize 1 min into recovery.  Normal myocardial perfusion. Stress LVEF 35% with moderate global decrease in myocardial thickening and wall motion. High risk study due to low stress LVEF.  EKG 12/10/2020: Sinus rhythm 65 bpm Possible old anteroseptal infarct Inferolateral T wave inversion, nonspecific   Coronary intervention 12/06/2020: LM: Normal LAD: No significant disease Lcx: Prox thrombotic 95% stenosis, 60% ostial OM1 stenosis        Mid Lcx occlusion, likely chronic RCA: Mid 60% disease. Right-to-left collaterals to occluded  Lcx   Successful percutaneous coronary intervention prox Lcx-OM1        PTCA and stent placement 3.0 X 30 mm Onyx Frontier drug-eluting  stent, post dilatation with 3.25X8 and 3.5X15 mm [place] balloons up to 20 atm   Distal embolization into small branches of OM1. Aggrastat bolus and infusion started.  Echocardiogram 12/06/2020:  1. LVEF is depressed with hypokinesis of the distal anterior, basal  inferior and apical walls and severe hypokinesis of the distal lateral  walls. LVEF 40 to 45%. Left ventricular diastolic parameters are  consistent with Grade I  diastolic dysfunction  (impaired relaxation). Elevated left atrial pressure.   2. Right ventricular systolic function is normal. The right ventricular  size is normal.   3. Soft tissue density epicardially, seen most prominently inferior to  RV. May represent adipose tissue, cannot exclude inflammation with  consolidation. No old images to compare . a small pericardial effusion is  present.   4. The mitral valve is normal in structure. No evidence of mitral valve  regurgitation.   5. The aortic valve is normal in structure. Aortic valve regurgitation is  not visualized.   6. The inferior vena cava is normal in size with greater than 50%  respiratory variability, suggesting right atrial pressure of 3 mmHg.    Recent labs: 01/02/2021: (After discontiuing Entresto) Glucose 269, BUN/Cr 9/1.26. EGFR 49. Na/K 139/4.1.   12/24/2020: (On Entresto 24-26 mg bid) Glucose 243, BUN/Cr 11/1.82. EGFR 32. Na/K 132/3.8.   12/16/2020: (On Entresto 49-51 mg bid) Glucose 195, BUN/Cr 11/1.47. EGFR 41. Na/K 139/4.1.   12/07/2020: (On Entresto 24-26 mg bid) Glucose 180, BUN/Cr 10/0.87. EGFR >60. Na/K 136/4.4.  H/H 11.7/35.7. MCV 87. Platelets 370 HbA1C 9.4% Chol 201, TG 78, HDL 51, LDL 134 TSH N/A  Results for Monica Bell (MRN [id number]) as of 12/10/2020 11:10  Ref. Range 12/06/2020 01:39 12/06/2020 05:03 12/06/2020 07:46 12/06/2020 10:07  Troponin I (High Sensitivity) Latest Ref Range: <18 ng/L 149 (HH) 396 (HH) 734 (HH) 1,050 (HH)     Review of Systems  Constitutional: Positive for malaise/fatigue.  Cardiovascular:  Positive for chest pain (Different from her angina at the time of MI) and dyspnea on exertion. Negative for leg swelling, palpitations and syncope.       Vitals:   02/24/21 1428  BP: 130/64  Pulse: 82  Resp: 17  Temp: 98 F (36.7 C)  SpO2: 97%      Body mass index is 25.66 kg/m. Filed Weights   02/24/21 1428  Weight: 184 lb (83.5 kg)     Objective:   Physical  Exam Vitals and nursing note reviewed.  Constitutional:      General: She is not in acute distress. Neck:     Vascular: No JVD.  Cardiovascular:     Rate and Rhythm: Normal rate and regular rhythm.     Pulses: Decreased pulses.     Heart sounds: Normal heart sounds. No murmur heard. Pulmonary:     Effort: Pulmonary effort is normal.     Breath sounds: Normal breath sounds. No wheezing or rales.  Musculoskeletal:     Right lower leg: No edema.     Left lower leg: No edema.        Assessment & Recommendations:   60 year old African-American female with hypertension, hyperlipidemia, type 2 diabetes mellitus, tobacco dependence, admitted with NSTEMI   CAD: NSTEMI 11/2020. Culprit: thrombotic 95% stenosis Prox Lcx into large OM1 Non-culprit: Mid Lcx chronic occlusion with R-to-L grade 3  collaterals                     Mid RCA 60% stenosis Recommend DAPT with Aspirin and Brilinta till 11/2021 Metoprolol succinate 50 mg daily Crestor 40 mg daily Aggressive diabetes management with PCP (A1C is 9%) No ischemia, low stress LVEF 35% (SPECT MPI 12/2020)  Her angina symptoms are likely from CT of her left circumflex.  Given her stable angina symptoms, absence of ischemia on stress testing, I would continue medical management for now.  I have started amlodipine 5 mg daily.  In future, we could increase metoprolol succinate to 100 mg daily, and add Ranexa.  Patient is also in favor of medical management, instead of CTO PCI. Will follow-up in 4 weeks.   HFrEF: Clinically euvolumic. LVEDP minimally elevated at 16 mmhg on cath EF 40-45% with Lcx/OM territory WMA Entresto discontinued due to AKI Continue metoprolol succinate 50 mg daily Continue Bidil 20-37.5 mg tid. Encourage hydration. Continue Corlanor 5 mg bid. Enrolled in EMPACT-MI clinical trial (Empaglifozin vs placebo in post MI heart failure patients).  Consider using non-SGLT2i for diabetes management.    We will recheck  echocardiogram in 4 weeks.  Hypertension: As above   Hyperlipidemia: Uncontrolled. LDL 134.  Currently on Crestor 40 mg daily.  Will obtain recent lipid panel performed by PCP.  Goal LDL is <70. If LDL remains >70, will add Repatha.   Type 2 DM: Uncontrolled. Defer management to primary team.  Enrolled in EMPACT-MI clinical trial (Empaglifozin vs placebo in post MI heart failure patients).  Consider using non-SGLT2i for diabetes management.    Nicotine dependence: Congratulated her on cessation   F/u in 4 weeks    Nigel Mormon, MD Pager: [phone] Office: [phone]

## 2021-02-26 ENCOUNTER — Encounter: Payer: Self-pay | Admitting: *Deleted

## 2021-02-26 ENCOUNTER — Encounter: Payer: BC Managed Care – PPO | Attending: Cardiology | Admitting: *Deleted

## 2021-02-26 ENCOUNTER — Other Ambulatory Visit: Payer: Self-pay

## 2021-02-26 DIAGNOSIS — Z955 Presence of coronary angioplasty implant and graft: Secondary | ICD-10-CM | POA: Diagnosis present

## 2021-02-26 DIAGNOSIS — I214 Non-ST elevation (NSTEMI) myocardial infarction: Secondary | ICD-10-CM | POA: Diagnosis present

## 2021-02-26 NOTE — Progress Notes (Signed)
Cardiac Individual Treatment Plan  Patient Details  Name: Monica Bell MRN: [id number] Date of Birth: 11/04/1960 Referring Provider:   Flowsheet Row Cardiac Rehab from 01/30/2021 in [facility] Cardiac and Pulmonary Rehab  Referring Provider Patwardhan       Initial Encounter Date:  Flowsheet Row Cardiac Rehab from 01/30/2021 in [facility] Cardiac and Pulmonary Rehab  Date 01/30/21       Visit Diagnosis: NSTEMI (non-ST elevated myocardial infarction) [facility])  Status post coronary artery stent placement  Patient's Home Medications on Admission:  Current Outpatient Medications:    amLODipine (NORVASC) 5 MG tablet, Take 1 tablet (5 mg total) by mouth daily., Disp: 30 tablet, Rfl: 3   BRILINTA 90 MG TABS tablet, TAKE 1 TABLET BY MOUTH TWICE DAILY, Disp: 60 tablet, Rfl: 0   Calcium Carbonate-Vitamin D (CALTRATE 600+D PO), Take 1 tablet by mouth daily., Disp: , Rfl:    cetirizine (ZYRTEC) 10 MG tablet, Take 10 mg by mouth daily., Disp: , Rfl:    cholecalciferol (VITAMIN D3) 25 MCG (1000 UNIT) tablet, Take 1,000 Units by mouth daily., Disp: , Rfl:    GNP ASPIRIN LOW DOSE 81 MG EC tablet, TAKE 1 TABLET BY MOUTH ONCE DAILY. SWALLOW WHOLE., Disp: 90 tablet, Rfl: 0   Investigational - Study Medication, EMPACT-MI Study  Empagliflozin /or Placebo, Disp: , Rfl:    isosorbide-hydrALAZINE (BIDIL) 20-37.5 MG tablet, Take 1 tablet by mouth 3 (three) times daily., Disp: 90 tablet, Rfl: 3   ivabradine (CORLANOR) 5 MG TABS tablet, Take 1 tablet (5 mg total) by mouth 2 (two) times daily with a meal., Disp: 60 tablet, Rfl: 2   levothyroxine (SYNTHROID) 137 MCG tablet, Take 137 mcg by mouth daily., Disp: , Rfl:    losartan (COZAAR) 50 MG tablet, Take 1 tablet by mouth daily., Disp: , Rfl:    magnesium oxide (MAG-OX) 400 MG tablet, Take 400 mg by mouth daily., Disp: , Rfl:    metFORMIN (GLUCOPHAGE) 1000 MG tablet, Take 1,000 mg by mouth 2 (two) times daily with a meal., Disp: , Rfl:    metoprolol succinate  (TOPROL-XL) 50 MG 24 hr tablet, Take 1 tablet (50 mg total) by mouth daily. Take with or immediately following a meal., Disp: 30 tablet, Rfl: 0   Multiple Minerals-Vitamins (CAL-MAG-ZINC-D PO), Take by mouth., Disp: , Rfl:    nitroGLYCERIN (NITROSTAT) 0.4 MG SL tablet, Place 1 tablet (0.4 mg total) under the tongue every 5 (five) minutes as needed for chest pain., Disp: 30 tablet, Rfl: 3   omeprazole (PRILOSEC) 20 MG capsule, Take 20 mg by mouth daily., Disp: , Rfl:    rosuvastatin (CRESTOR) 40 MG tablet, Take 1 tablet (40 mg total) by mouth daily., Disp: 30 tablet, Rfl: 0   TRESIBA FLEXTOUCH 200 UNIT/ML FlexTouch Pen, Inject 45 Units into the skin daily., Disp: , Rfl:    vitamin B-12 (CYANOCOBALAMIN) 100 MCG tablet, Take 100 mcg by mouth daily., Disp: , Rfl:   Past Medical History: Past Medical History:  Diagnosis Date   Abnormal mammogram    Allergic rhinitis due to pollen 08/01/2019   Arthritis 01/06/2019   osteoarthritis of both knees   BMI 28.0-28.9,adult 08/01/2019   Cystocele, unspecified (CODE)    Diabetes mellitus    GERD (gastroesophageal reflux disease)    Heart murmur    Hyperlipemia, mixed 12/14/2018   Hypertension    Hypothyroidism    Stress incontinence, female    Thyroid disease    Type 2 diabetes mellitus, without long-term current use  of insulin ([place]) 01/06/2019   Vitamin D deficiency 01/06/2019    Tobacco Use: Social History   Tobacco Use  Smoking Status Former   Packs/day: 0.50   Years: 15.00   Pack years: 7.50   Types: Cigarettes   Quit date: 11/2020   Years since quitting: 0.2  Smokeless Tobacco Never    Labs: Recent Review Scientist, physiological     Labs for ITP Cardiac and Pulmonary Rehab Latest Ref Rng & Units 12/06/2020   Cholestrol 0 - 200 mg/dL 201(H)   LDLCALC 0 - 99 mg/dL 134(H)   HDL >40 mg/dL 51   Trlycerides <150 mg/dL 78   Hemoglobin A1c 4.8 - 5.6 % 9.4(H)        Exercise Target Goals: Exercise Program Goal: Individual exercise  prescription set using results from initial 6 min walk test and THRR while considering  patient's activity barriers and safety.   Exercise Prescription Goal: Initial exercise prescription builds to 30-45 minutes a day of aerobic activity, 2-3 days per week.  Home exercise guidelines will be given to patient during program as part of exercise prescription that the participant will acknowledge.   Education: Aerobic Exercise: - Group verbal and visual presentation on the components of exercise prescription. Introduces F.I.T.T principle from ACSM for exercise prescriptions.  Reviews F.I.T.T. principles of aerobic exercise including progression. Written material given at graduation.   Education: Resistance Exercise: - Group verbal and visual presentation on the components of exercise prescription. Introduces F.I.T.T principle from ACSM for exercise prescriptions  Reviews F.I.T.T. principles of resistance exercise including progression. Written material given at graduation.    Education: Exercise & Equipment Safety: - Individual verbal instruction and demonstration of equipment use and safety with use of the equipment. Flowsheet Row Cardiac Rehab from 02/12/2021 in [facility] Cardiac and Pulmonary Rehab  Education need identified 01/30/21  Date 01/30/21  Educator AS  Instruction Review Code 1- Verbalizes Understanding       Education: Exercise Physiology & General Exercise Guidelines: - Group verbal and written instruction with models to review the exercise physiology of the cardiovascular system and associated critical values. Provides general exercise guidelines with specific guidelines to those with heart or lung disease.    Education: Flexibility, Balance, Mind/Body Relaxation: - Group verbal and visual presentation with interactive activity on the components of exercise prescription. Introduces F.I.T.T principle from ACSM for exercise prescriptions. Reviews F.I.T.T. principles of flexibility  and balance exercise training including progression. Also discusses the mind body connection.  Reviews various relaxation techniques to help reduce and manage stress (i.e. Deep breathing, progressive muscle relaxation, and visualization). Balance handout provided to take home. Written material given at graduation.   Activity Barriers & Risk Stratification:  Activity Barriers & Cardiac Risk Stratification - 01/17/21 1505       Activity Barriers & Cardiac Risk Stratification   Activity Barriers Joint Problems   rotator cuff injury, injections in both knees and shoulders   Cardiac Risk Stratification Moderate             6 Minute Walk:  6 Minute Walk     Row Name 01/30/21 1646         6 Minute Walk   Phase Initial     Distance 915 feet     Walk Time 6 minutes     # of Rest Breaks 0     MPH 1.73     METS 3.2     RPE 13     Perceived Dyspnea  1  VO2 Peak 11.3     Symptoms No     Resting HR 62 bpm     Resting BP 138/62     Resting Oxygen Saturation  99 %     Exercise Oxygen Saturation  during 6 min walk 98 %     Max Ex. HR 86 bpm     Max Ex. BP 186/66     2 Minute Post BP 136/64              Oxygen Initial Assessment:   Oxygen Re-Evaluation:   Oxygen Discharge (Final Oxygen Re-Evaluation):   Initial Exercise Prescription:  Initial Exercise Prescription - 01/30/21 1600       Date of Initial Exercise RX and Referring Provider   Date 01/30/21    Referring Provider Patwardhan      Treadmill   MPH 1.7    Grade 1.5    Minutes 15    METs 2.6      Recumbant Bike   Level 1    RPM 60    Minutes 15    METs 3      NuStep   Level 1    SPM 80    Minutes 15    METs 3      REL-XR   Level 1    Speed 50    Minutes 15    METs 3      Track   Laps 38    Minutes 15      Prescription Details   Frequency (times per week) 3    Duration Progress to 30 minutes of continuous aerobic without signs/symptoms of physical distress      Intensity   THRR  40-80% of Max Heartrate 95-139    Ratings of Perceived Exertion 11-13    Perceived Dyspnea 0-4      Resistance Training   Training Prescription Yes    Weight 3lb    Reps 10-15             Perform Capillary Blood Glucose checks as needed.  Exercise Prescription Changes:   Exercise Prescription Changes     Row Name 01/30/21 1600 02/17/21 1500           Response to Exercise   Blood Pressure (Admit) 138/62 128/56      Blood Pressure (Exercise) 186/66 134/64      Blood Pressure (Exit) 136/64 132/82      Heart Rate (Admit) 62 bpm 83 bpm      Heart Rate (Exercise) 86 bpm 106 bpm      Heart Rate (Exit) 60 bpm 82 bpm      Oxygen Saturation (Admit) 99 % --      Oxygen Saturation (Exercise) 98 % --      Rating of Perceived Exertion (Exercise) 13 15      Perceived Dyspnea (Exercise) 1 --      Symptoms none --      Comments -- third day exercise      Duration -- Progress to 30 minutes of  aerobic without signs/symptoms of physical distress      Intensity -- THRR unchanged        Progression   Progression -- Continue to progress workloads to maintain intensity without signs/symptoms of physical distress.      Average METs -- 2.55        Resistance Training   Training Prescription -- Yes      Weight -- 3 lb  Reps -- 10-15        Treadmill   MPH -- 1.3      Grade -- 1      Minutes -- 15      METs -- 2.17               Exercise Comments:   Exercise Comments     Row Name 02/03/21 1425           Exercise Comments First full day of exercise!  Patient was oriented to gym and equipment including functions, settings, policies, and procedures.  Patient's individual exercise prescription and treatment plan were reviewed.  All starting workloads were established based on the results of the 6 minute walk test done at initial orientation visit.  The plan for exercise progression was also introduced and progression will be customized based on patient's performance and  goals.                Exercise Goals and Review:   Exercise Goals     Row Name 01/30/21 1649             Exercise Goals   Increase Physical Activity Yes       Intervention Provide advice, education, support and counseling about physical activity/exercise needs.;Develop an individualized exercise prescription for aerobic and resistive training based on initial evaluation findings, risk stratification, comorbidities and participant's personal goals.       Expected Outcomes Short Term: Attend rehab on a regular basis to increase amount of physical activity.;Long Term: Add in home exercise to make exercise part of routine and to increase amount of physical activity.;Long Term: Exercising regularly at least 3-5 days a week.       Increase Strength and Stamina Yes       Intervention Provide advice, education, support and counseling about physical activity/exercise needs.;Develop an individualized exercise prescription for aerobic and resistive training based on initial evaluation findings, risk stratification, comorbidities and participant's personal goals.       Expected Outcomes Short Term: Increase workloads from initial exercise prescription for resistance, speed, and METs.;Short Term: Perform resistance training exercises routinely during rehab and add in resistance training at home;Long Term: Improve cardiorespiratory fitness, muscular endurance and strength as measured by increased METs and functional capacity (6MWT)       Able to understand and use rate of perceived exertion (RPE) scale Yes       Intervention Provide education and explanation on how to use RPE scale       Expected Outcomes Short Term: Able to use RPE daily in rehab to express subjective intensity level;Long Term:  Able to use RPE to guide intensity level when exercising independently       Able to understand and use Dyspnea scale Yes       Intervention Provide education and explanation on how to use Dyspnea scale        Expected Outcomes Short Term: Able to use Dyspnea scale daily in rehab to express subjective sense of shortness of breath during exertion;Long Term: Able to use Dyspnea scale to guide intensity level when exercising independently       Knowledge and understanding of Target Heart Rate Range (THRR) Yes       Intervention Provide education and explanation of THRR including how the numbers were predicted and where they are located for reference       Expected Outcomes Short Term: Able to state/look up THRR;Short Term: Able to use daily as guideline for intensity  in rehab;Long Term: Able to use THRR to govern intensity when exercising independently       Able to check pulse independently Yes       Intervention Provide education and demonstration on how to check pulse in carotid and radial arteries.;Review the importance of being able to check your own pulse for safety during independent exercise       Expected Outcomes Short Term: Able to explain why pulse checking is important during independent exercise;Long Term: Able to check pulse independently and accurately       Understanding of Exercise Prescription Yes       Intervention Provide education, explanation, and written materials on patient's individual exercise prescription       Expected Outcomes Short Term: Able to explain program exercise prescription;Long Term: Able to explain home exercise prescription to exercise independently                Exercise Goals Re-Evaluation :  Exercise Goals Re-Evaluation     [place] Name 02/03/21 1425 02/17/21 1504           Exercise Goal Re-Evaluation   Exercise Goals Review Increase Physical Activity;Able to understand and use rate of perceived exertion (RPE) scale;Knowledge and understanding of Target Heart Rate Range (THRR);Understanding of Exercise Prescription;Increase Strength and Stamina;Able to understand and use Dyspnea scale;Able to check pulse independently Increase Physical Activity;Increase  Strength and Stamina      Comments Reviewed RPE and dyspnea scales, THR and program prescription with pt today.  Pt voiced understanding and was given a copy of goals to take home. Cheyann is working on building up to prescribed speed on TM.  She reaches THR range.  We will continue to monitor progress.      Expected Outcomes Short: Use RPE daily to regulate intensity. Long: Follow program prescription in THR. Short:  continue to build stamina on TM Long: reach prescribed speed               Discharge Exercise Prescription (Final Exercise Prescription Changes):  Exercise Prescription Changes - 02/17/21 1500       Response to Exercise   Blood Pressure (Admit) 128/56    Blood Pressure (Exercise) 134/64    Blood Pressure (Exit) 132/82    Heart Rate (Admit) 83 bpm    Heart Rate (Exercise) 106 bpm    Heart Rate (Exit) 82 bpm    Rating of Perceived Exertion (Exercise) 15    Comments third day exercise    Duration Progress to 30 minutes of  aerobic without signs/symptoms of physical distress    Intensity THRR unchanged      Progression   Progression Continue to progress workloads to maintain intensity without signs/symptoms of physical distress.    Average METs 2.55      Resistance Training   Training Prescription Yes    Weight 3 lb    Reps 10-15      Treadmill   MPH 1.3    Grade 1    Minutes 15    METs 2.17             Nutrition:  Target Goals: Understanding of nutrition guidelines, daily intake of sodium 1500mg , cholesterol 200mg , calories 30% from fat and 7% or less from saturated fats, daily to have 5 or more servings of fruits and vegetables.  Education: All About Nutrition: -Group instruction provided by verbal, written material, interactive activities, discussions, models, and posters to present general guidelines for heart healthy nutrition including fat, fiber, MyPlate,  the role of sodium in heart healthy nutrition, utilization of the nutrition label, and  utilization of this knowledge for meal planning. Follow up email sent as well. Written material given at graduation. Flowsheet Row Cardiac Rehab from 02/12/2021 in [facility] Cardiac and Pulmonary Rehab  Education need identified 01/30/21       Biometrics:  Pre Biometrics - 01/30/21 1650       Pre Biometrics   Height 5\' 11"  (1.803 m)    Weight 184 lb 1.6 oz (83.5 kg)    BMI (Calculated) 25.69    Single Leg Stand 8.9 seconds              Nutrition Therapy Plan and Nutrition Goals:   Nutrition Assessments:  MEDIFICTS Score Key: ?70 Need to make dietary changes  40-70 Heart Healthy Diet ? 40 Therapeutic Level Cholesterol Diet  Flowsheet Row Cardiac Rehab from 01/30/2021 in [facility] Cardiac and Pulmonary Rehab  Picture Your Plate Total Score on Admission 51      Picture Your Plate Scores: <16 Unhealthy dietary pattern with much room for improvement. 41-50 Dietary pattern unlikely to meet recommendations for good health and room for improvement. 51-60 More healthful dietary pattern, with some room for improvement.  >60 Healthy dietary pattern, although there may be some specific behaviors that could be improved.    Nutrition Goals Re-Evaluation:   Nutrition Goals Discharge (Final Nutrition Goals Re-Evaluation):   Psychosocial: Target Goals: Acknowledge presence or absence of significant depression and/or stress, maximize coping skills, provide positive support system. Participant is able to verbalize types and ability to use techniques and skills needed for reducing stress and depression.   Education: Stress, Anxiety, and Depression - Group verbal and visual presentation to define topics covered.  Reviews how body is impacted by stress, anxiety, and depression.  Also discusses healthy ways to reduce stress and to treat/manage anxiety and depression.  Written material given at graduation.   Education: Sleep Hygiene -Provides group verbal and written instruction about how  sleep can affect your health.  Define sleep hygiene, discuss sleep cycles and impact of sleep habits. Review good sleep hygiene tips.    Initial Review & Psychosocial Screening:  Initial Psych Review & Screening - 01/17/21 1509       Initial Review   Current issues with Current Stress Concerns    Source of Stress Concerns Occupation      [employer] [employer]? Yes   daughter     Barriers   Psychosocial barriers to participate in program There are no identifiable barriers or psychosocial needs.;The patient should benefit from training in stress management and relaxation.      Screening Interventions   Interventions Encouraged to exercise;Provide feedback about the scores to participant;To provide support and resources with identified psychosocial needs    Expected Outcomes Short Term goal: Utilizing psychosocial counselor, staff and physician to assist with identification of specific Stressors or current issues interfering with healing process. Setting desired goal for each stressor or current issue identified.;Long Term Goal: Stressors or current issues are controlled or eliminated.;Short Term goal: Identification and review with participant of any Quality of Life or Depression concerns found by scoring the questionnaire.;Long Term goal: The participant improves quality of Life and PHQ9 Scores as seen by post scores and/or verbalization of changes             Quality of Life Scores:   Quality of Life - 01/30/21 1653       Quality of  Life   Select Quality of Life      Quality of Life Scores   Health/Function Pre 17.92 %    Socioeconomic Pre 15.38 %    Psych/Spiritual Pre 19.71 %    Family Pre 22.6 %    GLOBAL Pre 18.39 %            Scores of 19 and below usually indicate a poorer quality of life in these areas.  A difference of  2-3 points is a clinically meaningful difference.  A difference of 2-3 points in the total score of the Quality of Life Index has  been associated with significant improvement in overall quality of life, self-image, physical symptoms, and general health in studies assessing change in quality of life.  PHQ-9: Recent Review Flowsheet Data     Depression screen [facility] 2/9 01/30/2021   Decreased Interest 1   Down, Depressed, Hopeless 1   PHQ - 2 Score 2   Altered sleeping 2   Tired, decreased energy 3   Change in appetite 1   Feeling bad or failure about yourself  0   Moving slowly or fidgety/restless 2   Suicidal thoughts 0   PHQ-9 Score 10   Difficult doing work/chores Somewhat difficult      Interpretation of Total Score  Total Score Depression Severity:  1-4 = Minimal depression, 5-9 = Mild depression, 10-14 = Moderate depression, 15-19 = Moderately severe depression, 20-27 = Severe depression   Psychosocial Evaluation and Intervention:  Psychosocial Evaluation - 01/17/21 1515       Psychosocial Evaluation & Interventions   Interventions Encouraged to exercise with the program and follow exercise prescription    Comments Yetta is still feeling very tired after her NSTEMI. She is ready to get her stamina back. 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 She will think about vocational rehab and let staff know if that is something she is interested in. Her daughter is her main support system and urging her to attend cardiac rehab.    Expected Outcomes Short; attend cardiac rehab for education and exercise. Long: develop and maintain positive self care habits.    Continue Psychosocial Services  Follow up required by staff             Psychosocial Re-Evaluation:   Psychosocial Discharge (Final Psychosocial Re-Evaluation):   Vocational Rehabilitation: Provide vocational rehab assistance to qualifying candidates.   Vocational Rehab Evaluation & Intervention:  Vocational Rehab - 01/17/21 1508       Initial Vocational Rehab  Evaluation & Intervention   Assessment shows need for Vocational Rehabilitation No             Education: Education Goals: Education classes will be provided on a variety of topics geared toward better understanding of heart health and risk factor modification. Participant will state understanding/return demonstration of topics presented as noted by education test scores.  Learning Barriers/Preferences:  Learning Barriers/Preferences - 01/17/21 1508       Learning Barriers/Preferences   Learning Barriers None    Learning Preferences None             General Cardiac Education Topics:  AED/CPR: - Group verbal and written instruction with the use of models to demonstrate the basic use of the AED with the basic ABC's of resuscitation.   Anatomy and Cardiac Procedures: - Group verbal and visual presentation and models provide information about basic cardiac anatomy  and function. Reviews the testing methods done to diagnose heart disease and the outcomes of the test results. Describes the treatment choices: Medical Management, Angioplasty, or Coronary Bypass Surgery for treating various heart conditions including Myocardial Infarction, Angina, Valve Disease, and Cardiac Arrhythmias.  Written material given at graduation.   Medication Safety: - Group verbal and visual instruction to review commonly prescribed medications for heart and lung disease. Reviews the medication, class of the drug, and side effects. Includes the steps to properly store meds and maintain the prescription regimen.  Written material given at graduation.   Intimacy: - Group verbal instruction through game format to discuss how heart and lung disease can affect sexual intimacy. Written material given at graduation..   Know Your Numbers and Heart Failure: - Group verbal and visual instruction to discuss disease risk factors for cardiac and pulmonary disease and treatment options.  Reviews associated critical  values for Overweight/Obesity, Hypertension, Cholesterol, and Diabetes.  Discusses basics of heart failure: signs/symptoms and treatments.  Introduces Heart Failure Zone chart for action plan for heart failure.  Written material given at graduation.   Infection Prevention: - Provides verbal and written material to individual with discussion of infection control including proper hand washing and proper equipment cleaning during exercise session. Flowsheet Row Cardiac Rehab from 02/12/2021 in [facility] Cardiac and Pulmonary Rehab  Date 01/30/21  Educator AS  Instruction Review Code 1- Verbalizes Understanding       Falls Prevention: - Provides verbal and written material to individual with discussion of falls prevention and safety. Flowsheet Row Cardiac Rehab from 02/12/2021 in [facility] Cardiac and Pulmonary Rehab  Date 01/30/21  Educator AS  Instruction Review Code 1- Verbalizes Understanding       Other: -Provides group and verbal instruction on various topics (see comments)   Knowledge Questionnaire Score:  Knowledge Questionnaire Score - 01/30/21 1732       Knowledge Questionnaire Score   Pre Score 18/26             Core Components/Risk Factors/Patient Goals at Admission:  Personal Goals and Risk Factors at Admission - 01/30/21 1652       Core Components/Risk Factors/Patient Goals on Admission   Tobacco Cessation Yes    Number of packs per day Quit August 2022    Intervention Offer self-teaching materials, assist with locating and accessing local/national Quit Smoking programs, and support quit date choice.;Assist the participant in steps to quit. Provide individualized education and counseling about committing to Tobacco Cessation, relapse prevention, and pharmacological support that can be provided by physician.    Expected Outcomes Short Term: Will demonstrate readiness to quit, by selecting a quit date.;Short Term: Will quit all tobacco product use, adhering to prevention  of relapse plan.;Long Term: Complete abstinence from all tobacco products for at least 12 months from quit date.    Diabetes Yes    Intervention Provide education about signs/symptoms and action to take for hypo/hyperglycemia.;Provide education about proper nutrition, including hydration, and aerobic/resistive exercise prescription along with prescribed medications to achieve blood glucose in normal ranges: Fasting glucose 65-99 mg/dL    Expected Outcomes Short Term: Participant verbalizes understanding of the signs/symptoms and immediate care of hyper/hypoglycemia, proper foot care and importance of medication, aerobic/resistive exercise and nutrition plan for blood glucose control.;Long Term: Attainment of HbA1C < 7%.    Hypertension Yes    Intervention Provide education on lifestyle modifcations including regular physical activity/exercise, weight management, moderate sodium restriction and increased consumption of fresh fruit, vegetables, and low  fat dairy, alcohol moderation, and smoking cessation.;Monitor prescription use compliance.    Expected Outcomes Short Term: Continued assessment and intervention until BP is < 140/57mm HG in hypertensive participants. < 130/78mm HG in hypertensive participants with diabetes, heart failure or chronic kidney disease.;Long Term: Maintenance of blood pressure at goal levels.    Lipids Yes    Intervention Provide education and support for participant on nutrition & aerobic/resistive exercise along with prescribed medications to achieve LDL 70mg , HDL >40mg .    Expected Outcomes Short Term: Participant states understanding of desired cholesterol values and is compliant with medications prescribed. Participant is following exercise prescription and nutrition guidelines.;Long Term: Cholesterol controlled with medications as prescribed, with individualized exercise RX and with personalized nutrition plan. Value goals: LDL < 70mg , HDL > 40 mg.              Education:Diabetes - Individual verbal and written instruction to review signs/symptoms of diabetes, desired ranges of glucose level fasting, after meals and with exercise. Acknowledge that pre and post exercise glucose checks will be done for 3 sessions at entry of program. [place] from 01/17/2021 in [facility] Cardiac and Pulmonary Rehab  Date 01/17/21  Educator [facility]  Instruction Review Code 1- Verbalizes Understanding       Core Components/Risk Factors/Patient Goals Review:    Core Components/Risk Factors/Patient Goals at Discharge (Final Review):    ITP Comments:  ITP Comments     Row Name 01/17/21 1517 01/30/21 1733 02/03/21 1425 02/26/21 0706     ITP Comments Initial telephone orientation completed. Diagnosis can be found in [facility] 8/11. EP orientation scheduled for Thursday 10/6 at 3pm. Completed 6MWT and gym orientation. Initial ITP created and sent for review to Dr. Emily Filbert, Medical Director. First full day of exercise!  Patient was oriented to gym and equipment including functions, settings, policies, and procedures.  Patient's individual exercise prescription and treatment plan were reviewed.  All starting workloads were established based on the results of the 6 minute walk test done at initial orientation visit.  The plan for exercise progression was also introduced and progression will be customized based on patient's performance and goals. 30 Day review completed. Medical Director ITP review done, changes made as directed, and signed approval by Medical Director.             Comments:  30 Day review completed. Medical Director ITP review done, changes made as directed, and signed approval by Medical Director.

## 2021-02-26 NOTE — Progress Notes (Signed)
Daily Session Note  Patient Details  Name: Monica Bell MRN: [id number] Date of Birth: 04/29/1960 Referring Provider:   Flowsheet Row Cardiac Rehab from 01/30/2021 in [facility] Cardiac and Pulmonary Rehab  Referring Provider Patwardhan       Encounter Date: 02/26/2021  Check In:  Session Check In - 02/26/21 1453       Check-In   Supervising physician immediately available to respond to emergencies See telemetry face sheet for immediately available ER MD    Location [facility]-Cardiac & Pulmonary Rehab    Staff Present Nyoka Cowden, RN, BSN, Willette Pa, MA, RCEP, CCRP, [place], MS, ASCM CEP, Exercise Physiologist;Joseph Esto, [place]    Virtual Visit No    Medication changes reported     No    Fall or balance concerns reported    No    Tobacco Cessation No Change    Warm-up and Cool-down Performed on first and last piece of equipment    Resistance Training Performed Yes    VAD Patient? No    PAD/SET Patient? No      Pain Assessment   Currently in Pain? No/denies                Social History   Tobacco Use  Smoking Status Former   Packs/day: 0.50   Years: 15.00   Pack years: 7.50   Types: Cigarettes   Quit date: 11/2020   Years since quitting: 0.2  Smokeless Tobacco Never    Goals Met:  Independence with exercise equipment Exercise tolerated well No report of concerns or symptoms today  Goals Unmet:  Not Applicable  Comments: Pt able to follow exercise prescription today without complaint.  Will continue to monitor for progression.     Dr. Emily Filbert is Medical Director for [place].  Dr. Ottie Glazier is Medical Director for [facility] Pulmonary Rehabilitation.

## 2021-02-27 ENCOUNTER — Encounter: Payer: BC Managed Care – PPO | Admitting: *Deleted

## 2021-02-27 ENCOUNTER — Other Ambulatory Visit: Payer: Self-pay

## 2021-02-27 DIAGNOSIS — I214 Non-ST elevation (NSTEMI) myocardial infarction: Secondary | ICD-10-CM | POA: Diagnosis not present

## 2021-02-27 NOTE — Progress Notes (Signed)
Daily Session Note  Patient Details  Name: Monica Bell MRN: [id number] Date of Birth: 16-May-1960 Referring Provider:   Flowsheet Row Cardiac Rehab from 01/30/2021 in [facility] Cardiac and Pulmonary Rehab  Referring Provider Patwardhan       Encounter Date: 02/27/2021  Check In:  Session Check In - 02/27/21 1415       Check-In   Supervising physician immediately available to respond to emergencies See telemetry face sheet for immediately available ER MD    Location [facility]-Cardiac & Pulmonary Rehab    Staff Present Renita Papa, RN BSN;Joseph Alpaugh, RCP,RRT,BSRT;Jessica Standard, [place], RCEP, CCRP, CCET    Virtual Visit No    Medication changes reported     No    Fall or balance concerns reported    No    Warm-up and Cool-down Performed on first and last piece of equipment    Resistance Training Performed Yes    VAD Patient? No    PAD/SET Patient? No      Pain Assessment   Currently in Pain? No/denies                Social History   Tobacco Use  Smoking Status Former   Packs/day: 0.50   Years: 15.00   Pack years: 7.50   Types: Cigarettes   Quit date: 11/2020   Years since quitting: 0.2  Smokeless Tobacco Never    Goals Met:  Independence with exercise equipment Exercise tolerated well No report of concerns or symptoms today Strength training completed today  Goals Unmet:  Not Applicable  Comments: Pt able to follow exercise prescription today without complaint.  Will continue to monitor for progression.    Dr. Emily Filbert is Medical Director for [place].  Dr. Ottie Glazier is Medical Director for [facility] Pulmonary Rehabilitation.

## 2021-03-03 ENCOUNTER — Other Ambulatory Visit: Payer: Self-pay

## 2021-03-03 DIAGNOSIS — I214 Non-ST elevation (NSTEMI) myocardial infarction: Secondary | ICD-10-CM | POA: Diagnosis not present

## 2021-03-03 DIAGNOSIS — Z955 Presence of coronary angioplasty implant and graft: Secondary | ICD-10-CM

## 2021-03-03 NOTE — Progress Notes (Signed)
Daily Session Note  Patient Details  Name: ANESHA HACKERT MRN: [id number] Date of Birth: 1960-10-08 Referring Provider:   Flowsheet Row Cardiac Rehab from 01/30/2021 in [facility] Cardiac and Pulmonary Rehab  Referring Provider Patwardhan       Encounter Date: 03/03/2021  Check In:  Session Check In - 03/03/21 1402       Check-In   Supervising physician immediately available to respond to emergencies See telemetry face sheet for immediately available ER MD    Location [facility]-Cardiac & Pulmonary Rehab    Staff Present Birdie Sons, MPA, Nino Glow, MS, ASCM CEP, Exercise Physiologist;Joseph Tessie Fass, [place]    Virtual Visit No    Medication changes reported     No    Fall or balance concerns reported    No    Tobacco Cessation No Change    Warm-up and Cool-down Performed on first and last piece of equipment    Resistance Training Performed Yes    VAD Patient? No    PAD/SET Patient? No      Pain Assessment   Currently in Pain? No/denies                Social History   Tobacco Use  Smoking Status Former   Packs/day: 0.50   Years: 15.00   Pack years: 7.50   Types: Cigarettes   Quit date: 11/2020   Years since quitting: 0.2  Smokeless Tobacco Never    Goals Met:  Independence with exercise equipment Exercise tolerated well No report of concerns or symptoms today Strength training completed today  Goals Unmet:  Not Applicable  Comments: Pt able to follow exercise prescription today without complaint.  Will continue to monitor for progression.    Dr. Emily Filbert is Medical Director for [place].  Dr. Ottie Glazier is Medical Director for [facility] Pulmonary Rehabilitation.

## 2021-03-13 ENCOUNTER — Ambulatory Visit: Payer: BC Managed Care – PPO | Admitting: Podiatry

## 2021-03-14 ENCOUNTER — Other Ambulatory Visit: Payer: Self-pay | Admitting: Cardiology

## 2021-03-17 ENCOUNTER — Other Ambulatory Visit: Payer: Self-pay

## 2021-03-17 DIAGNOSIS — I214 Non-ST elevation (NSTEMI) myocardial infarction: Secondary | ICD-10-CM

## 2021-03-17 DIAGNOSIS — Z955 Presence of coronary angioplasty implant and graft: Secondary | ICD-10-CM

## 2021-03-17 NOTE — Progress Notes (Signed)
Daily Session Note  Patient Details  Name: Monica Bell MRN: [id number] Date of Birth: 04/06/1961 Referring Provider:   Flowsheet Row Cardiac Rehab from 01/30/2021 in [facility] Cardiac and Pulmonary Rehab  Referring Provider Patwardhan       Encounter Date: 03/17/2021  Check In:  Session Check In - 03/17/21 1407       Check-In   Supervising physician immediately available to respond to emergencies See telemetry face sheet for immediately available ER MD    Location [facility]-Cardiac & Pulmonary Rehab    Staff Present Kelly Bollinger, MPA, RN;Joseph Hood, RCP,RRT,BSRT;Kara Langdon, MS, ASCM CEP, Exercise Physiologist    Virtual Visit No    Medication changes reported     No    Fall or balance concerns reported    No    Tobacco Cessation No Change    Warm-up and Cool-down Performed on first and last piece of equipment    Resistance Training Performed Yes    VAD Patient? No    PAD/SET Patient? No      Pain Assessment   Currently in Pain? No/denies                Social History   Tobacco Use  Smoking Status Former   Packs/day: 0.50   Years: 15.00   Pack years: 7.50   Types: Cigarettes   Quit date: 11/2020   Years since quitting: 0.3  Smokeless Tobacco Never    Goals Met:  Independence with exercise equipment Exercise tolerated well Personal goals reviewed No report of concerns or symptoms today Strength training completed today  Goals Unmet:  Not Applicable  Comments: Pt able to follow exercise prescription today without complaint.  Will continue to monitor for progression.    Dr. Mark Miller is Medical Director for [facility].  Dr. Fuad Aleskerov is Medical Director for [facility] Pulmonary Rehabilitation. 

## 2021-03-18 ENCOUNTER — Ambulatory Visit: Payer: BC Managed Care – PPO

## 2021-03-18 DIAGNOSIS — I25118 Atherosclerotic heart disease of native coronary artery with other forms of angina pectoris: Secondary | ICD-10-CM

## 2021-03-18 DIAGNOSIS — I5020 Unspecified systolic (congestive) heart failure: Secondary | ICD-10-CM

## 2021-03-24 ENCOUNTER — Other Ambulatory Visit: Payer: Self-pay

## 2021-03-24 DIAGNOSIS — I214 Non-ST elevation (NSTEMI) myocardial infarction: Secondary | ICD-10-CM

## 2021-03-24 DIAGNOSIS — Z955 Presence of coronary angioplasty implant and graft: Secondary | ICD-10-CM

## 2021-03-24 NOTE — Progress Notes (Signed)
Daily Session Note  Patient Details  Name: MIRKA BARBONE MRN: [id number] Date of Birth: 03/05/1961 Referring Provider:   Flowsheet Row Cardiac Rehab from 01/30/2021 in [facility] Cardiac and Pulmonary Rehab  Referring Provider Patwardhan       Encounter Date: 03/24/2021  Check In:  Session Check In - 03/24/21 1418       Check-In   Supervising physician immediately available to respond to emergencies See telemetry face sheet for immediately available ER MD    Location [facility]-Cardiac & Pulmonary Rehab    Staff Present Birdie Sons, MPA, Nino Glow, MS, ASCM CEP, Exercise Physiologist;Joseph Tessie Fass, [place]    Virtual Visit No    Medication changes reported     No    Fall or balance concerns reported    No    Tobacco Cessation No Change    Warm-up and Cool-down Performed on first and last piece of equipment    Resistance Training Performed Yes    VAD Patient? No    PAD/SET Patient? No      Pain Assessment   Currently in Pain? No/denies                Social History   Tobacco Use  Smoking Status Former   Packs/day: 0.50   Years: 15.00   Pack years: 7.50   Types: Cigarettes   Quit date: 11/2020   Years since quitting: 0.3  Smokeless Tobacco Never    Goals Met:  Independence with exercise equipment Exercise tolerated well No report of concerns or symptoms today Strength training completed today  Goals Unmet:  Not Applicable  Comments: Pt able to follow exercise prescription today without complaint.  Will continue to monitor for progression.    Dr. Emily Filbert is Medical Director for [place].  Dr. Ottie Glazier is Medical Director for [facility] Pulmonary Rehabilitation.

## 2021-03-26 ENCOUNTER — Other Ambulatory Visit: Payer: Self-pay

## 2021-03-26 ENCOUNTER — Encounter: Payer: Self-pay | Admitting: *Deleted

## 2021-03-26 DIAGNOSIS — I214 Non-ST elevation (NSTEMI) myocardial infarction: Secondary | ICD-10-CM | POA: Diagnosis not present

## 2021-03-26 DIAGNOSIS — Z955 Presence of coronary angioplasty implant and graft: Secondary | ICD-10-CM

## 2021-03-26 NOTE — Progress Notes (Signed)
Daily Session Note  Patient Details  Name: Monica Bell MRN: [id number] Date of Birth: August 10, 1960 Referring Provider:   Flowsheet Row Cardiac Rehab from 01/30/2021 in [facility] Cardiac and Pulmonary Rehab  Referring Provider Patwardhan       Encounter Date: 03/26/2021  Check In:  Session Check In - 03/26/21 1424       Check-In   Supervising physician immediately available to respond to emergencies See telemetry face sheet for immediately available ER MD    Location [facility]-Cardiac & Pulmonary Rehab    Staff Present Birdie Sons, MPA, RN;Joseph Garnette Czech [place], MS, ASCM CEP, Exercise Physiologist    Virtual Visit No    Medication changes reported     No    Fall or balance concerns reported    No    Tobacco Cessation No Change    Warm-up and Cool-down Performed on first and last piece of equipment    Resistance Training Performed Yes    VAD Patient? No    PAD/SET Patient? No      Pain Assessment   Currently in Pain? No/denies                Social History   Tobacco Use  Smoking Status Former   Packs/day: 0.50   Years: 15.00   Pack years: 7.50   Types: Cigarettes   Quit date: 11/2020   Years since quitting: 0.3  Smokeless Tobacco Never    Goals Met:  Independence with exercise equipment Exercise tolerated well No report of concerns or symptoms today Strength training completed today  Goals Unmet:  Not Applicable  Comments: Pt able to follow exercise prescription today without complaint.  Will continue to monitor for progression.    Dr. Emily Filbert is Medical Director for [place].  Dr. Ottie Glazier is Medical Director for [facility] Pulmonary Rehabilitation.

## 2021-03-26 NOTE — Progress Notes (Signed)
Cardiac Individual Treatment Plan  Patient Details  Name: Monica Bell MRN: [id number] Date of Birth: [birth year] Referring Provider:   Flowsheet Row Cardiac Rehab from 01/30/2021 in [facility] Cardiac and Pulmonary Rehab  Referring Provider Patwardhan       Initial Encounter Date:  Flowsheet Row Cardiac Rehab from 01/30/2021 in [facility] Cardiac and Pulmonary Rehab  Date 01/30/21       Visit Diagnosis: NSTEMI (non-ST elevated myocardial infarction) [facility])  Status post coronary artery stent placement  Patient's Home Medications on Admission:  Current Outpatient Medications:    amLODipine (NORVASC) 5 MG tablet, Take 1 tablet (5 mg total) by mouth daily., Disp: 30 tablet, Rfl: 3   BRILINTA 90 MG TABS tablet, TAKE 1 TABLET BY MOUTH TWICE DAILY, Disp: 60 tablet, Rfl: 0   Calcium Carbonate-Vitamin D (CALTRATE 600+D PO), Take 1 tablet by mouth daily., Disp: , Rfl:    cetirizine (ZYRTEC) 10 MG tablet, Take 10 mg by mouth daily., Disp: , Rfl:    cholecalciferol (VITAMIN D3) 25 MCG (1000 UNIT) tablet, Take 1,000 Units by mouth daily., Disp: , Rfl:    GNP ASPIRIN LOW DOSE 81 MG EC tablet, TAKE 1 TABLET BY MOUTH ONCE DAILY. SWALLOW WHOLE., Disp: 90 tablet, Rfl: 1   Investigational - Study Medication, EMPACT-MI Study  Empagliflozin /or Placebo, Disp: , Rfl:    isosorbide-hydrALAZINE (BIDIL) 20-37.5 MG tablet, Take 1 tablet by mouth 3 (three) times daily., Disp: 90 tablet, Rfl: 3   ivabradine (CORLANOR) 5 MG TABS tablet, Take 1 tablet (5 mg total) by mouth 2 (two) times daily with a meal., Disp: 60 tablet, Rfl: 2   levothyroxine (SYNTHROID) 137 MCG tablet, Take 137 mcg by mouth daily., Disp: , Rfl:    losartan (COZAAR) 50 MG tablet, Take 1 tablet by mouth daily., Disp: , Rfl:    magnesium oxide (MAG-OX) 400 MG tablet, Take 400 mg by mouth daily., Disp: , Rfl:    metFORMIN (GLUCOPHAGE) 1000 MG tablet, Take 1,000 mg by mouth 2 (two) times daily with a meal., Disp: , Rfl:    metoprolol succinate  (TOPROL-XL) 50 MG 24 hr tablet, Take 1 tablet (50 mg total) by mouth daily. Take with or immediately following a meal., Disp: 30 tablet, Rfl: 0   Multiple Minerals-Vitamins (CAL-MAG-ZINC-D PO), Take by mouth., Disp: , Rfl:    nitroGLYCERIN (NITROSTAT) 0.4 MG SL tablet, Place 1 tablet (0.4 mg total) under the tongue every 5 (five) minutes as needed for chest pain., Disp: 30 tablet, Rfl: 3   omeprazole (PRILOSEC) 20 MG capsule, Take 20 mg by mouth daily., Disp: , Rfl:    rosuvastatin (CRESTOR) 40 MG tablet, Take 1 tablet (40 mg total) by mouth daily., Disp: 30 tablet, Rfl: 0   TRESIBA FLEXTOUCH 200 UNIT/ML FlexTouch Pen, Inject 45 Units into the skin daily., Disp: , Rfl:    vitamin B-12 (CYANOCOBALAMIN) 100 MCG tablet, Take 100 mcg by mouth daily., Disp: , Rfl:   Past Medical History: Past Medical History:  Diagnosis Date   Abnormal mammogram    Allergic rhinitis due to pollen 08/01/2019   Arthritis 01/06/2019   osteoarthritis of both knees   BMI 28.0-28.9,adult 08/01/2019   Cystocele, unspecified (CODE)    Diabetes mellitus    GERD (gastroesophageal reflux disease)    Heart murmur    Hyperlipemia, mixed 12/14/2018   Hypertension    Hypothyroidism    Stress incontinence, female    Thyroid disease    Type 2 diabetes mellitus, without long-term current use  of insulin ([place]) 01/06/2019   Vitamin D deficiency 01/06/2019    Tobacco Use: Social History   Tobacco Use  Smoking Status Former   Packs/day: 0.50   Years: 15.00   Pack years: 7.50   Types: Cigarettes   Quit date: 11/2020   Years since quitting: 0.3  Smokeless Tobacco Never    Labs: Recent Review Scientist, physiological     Labs for ITP Cardiac and Pulmonary Rehab Latest Ref Rng & Units 12/06/2020   Cholestrol 0 - 200 mg/dL 201(H)   LDLCALC 0 - 99 mg/dL 134(H)   HDL >40 mg/dL 51   Trlycerides <150 mg/dL 78   Hemoglobin A1c 4.8 - 5.6 % 9.4(H)        Exercise Target Goals: Exercise Program Goal: Individual exercise  prescription set using results from initial 6 min walk test and THRR while considering  patient's activity barriers and safety.   Exercise Prescription Goal: Initial exercise prescription builds to 30-45 minutes a day of aerobic activity, 2-3 days per week.  Home exercise guidelines will be given to patient during program as part of exercise prescription that the participant will acknowledge.   Education: Aerobic Exercise: - Group verbal and visual presentation on the components of exercise prescription. Introduces F.I.T.T principle from ACSM for exercise prescriptions.  Reviews F.I.T.T. principles of aerobic exercise including progression. Written material given at graduation.   Education: Resistance Exercise: - Group verbal and visual presentation on the components of exercise prescription. Introduces F.I.T.T principle from ACSM for exercise prescriptions  Reviews F.I.T.T. principles of resistance exercise including progression. Written material given at graduation. Flowsheet Row Cardiac Rehab from 02/26/2021 in [facility] Cardiac and Pulmonary Rehab  Date 02/26/21  Educator as  Instruction Review Code 1- Verbalizes Understanding        Education: Exercise & Equipment Safety: - Individual verbal instruction and demonstration of equipment use and safety with use of the equipment. Flowsheet Row Cardiac Rehab from 02/26/2021 in [facility] Cardiac and Pulmonary Rehab  Education need identified 01/30/21  Date 01/30/21  Educator AS  Instruction Review Code 1- Verbalizes Understanding       Education: Exercise Physiology & General Exercise Guidelines: - Group verbal and written instruction with models to review the exercise physiology of the cardiovascular system and associated critical values. Provides general exercise guidelines with specific guidelines to those with heart or lung disease.    Education: Flexibility, Balance, Mind/Body Relaxation: - Group verbal and visual presentation with  interactive activity on the components of exercise prescription. Introduces F.I.T.T principle from ACSM for exercise prescriptions. Reviews F.I.T.T. principles of flexibility and balance exercise training including progression. Also discusses the mind body connection.  Reviews various relaxation techniques to help reduce and manage stress (i.e. Deep breathing, progressive muscle relaxation, and visualization). Balance handout provided to take home. Written material given at graduation.   Activity Barriers & Risk Stratification:  Activity Barriers & Cardiac Risk Stratification - 01/17/21 1505       Activity Barriers & Cardiac Risk Stratification   Activity Barriers Joint Problems   rotator cuff injury, injections in both knees and shoulders   Cardiac Risk Stratification Moderate             6 Minute Walk:  6 Minute Walk     Row Name 01/30/21 1646         6 Minute Walk   Phase Initial     Distance 915 feet     Walk Time 6 minutes     # of  Rest Breaks 0     MPH 1.73     METS 3.2     RPE 13     Perceived Dyspnea  1     VO2 Peak 11.3     Symptoms No     Resting HR 62 bpm     Resting BP 138/62     Resting Oxygen Saturation  99 %     Exercise Oxygen Saturation  during 6 min walk 98 %     Max Ex. HR 86 bpm     Max Ex. BP 186/66     2 Minute Post BP 136/64              Oxygen Initial Assessment:   Oxygen Re-Evaluation:   Oxygen Discharge (Final Oxygen Re-Evaluation):   Initial Exercise Prescription:  Initial Exercise Prescription - 01/30/21 1600       Date of Initial Exercise RX and Referring Provider   Date 01/30/21    Referring Provider Patwardhan      Treadmill   MPH 1.7    Grade 1.5    Minutes 15    METs 2.6      Recumbant Bike   Level 1    RPM 60    Minutes 15    METs 3      NuStep   Level 1    SPM 80    Minutes 15    METs 3      REL-XR   Level 1    Speed 50    Minutes 15    METs 3      Track   Laps 38    Minutes 15       Prescription Details   Frequency (times per week) 3    Duration Progress to 30 minutes of continuous aerobic without signs/symptoms of physical distress      Intensity   THRR 40-80% of Max Heartrate 95-139    Ratings of Perceived Exertion 11-13    Perceived Dyspnea 0-4      Resistance Training   Training Prescription Yes    Weight 3lb    Reps 10-15             Perform Capillary Blood Glucose checks as needed.  Exercise Prescription Changes:   Exercise Prescription Changes     Row Name 01/30/21 1600 02/17/21 1500 03/04/21 1400         Response to Exercise   Blood Pressure (Admit) 138/62 128/56 142/62     Blood Pressure (Exercise) 186/66 134/64 160/60     Blood Pressure (Exit) 136/64 132/82 128/56     Heart Rate (Admit) 62 bpm 83 bpm 70 bpm     Heart Rate (Exercise) 86 bpm 106 bpm 110 bpm     Heart Rate (Exit) 60 bpm 82 bpm 87 bpm     Oxygen Saturation (Admit) 99 % -- --     Oxygen Saturation (Exercise) 98 % -- --     Rating of Perceived Exertion (Exercise) _0 Perceived Dyspnea (Exercise) 1 -- --     Symptoms none -- none     Comments -- third day exercise --     Duration -- Progress to 30 minutes of  aerobic without signs/symptoms of physical distress Continue with 30 min of aerobic exercise without signs/symptoms of physical distress.     Intensity -- THRR unchanged THRR unchanged       Progression   Progression -- Continue to  progress workloads to maintain intensity without signs/symptoms of physical distress. Continue to progress workloads to maintain intensity without signs/symptoms of physical distress.     Average METs -- 2.55 2.76       Resistance Training   Training Prescription -- Yes Yes     Weight -- 3 lb 3 lb     Reps -- 10-15 10-15       Interval Training   Interval Training -- -- No       Treadmill   MPH -- 1.3 1.7     Grade -- 1 3.5     Minutes -- 15 15     METs -- 2.17 3.12       Recumbant Bike   Level -- -- 1     Minutes --  -- 15     METs -- -- 2.39       NuStep   Level -- -- 1     Minutes -- -- 15       Oxygen   Maintain Oxygen Saturation -- -- 88% or higher              Exercise Comments:   Exercise Comments     Row Name 02/03/21 1425           Exercise Comments First full day of exercise!  Patient was oriented to gym and equipment including functions, settings, policies, and procedures.  Patient's individual exercise prescription and treatment plan were reviewed.  All starting workloads were established based on the results of the 6 minute walk test done at initial orientation visit.  The plan for exercise progression was also introduced and progression will be customized based on patient's performance and goals.                Exercise Goals and Review:   Exercise Goals     Row Name 01/30/21 1649             Exercise Goals   Increase Physical Activity Yes       Intervention Provide advice, education, support and counseling about physical activity/exercise needs.;Develop an individualized exercise prescription for aerobic and resistive training based on initial evaluation findings, risk stratification, comorbidities and participant's personal goals.       Expected Outcomes Short Term: Attend rehab on a regular basis to increase amount of physical activity.;Long Term: Add in home exercise to make exercise part of routine and to increase amount of physical activity.;Long Term: Exercising regularly at least 3-5 days a week.       Increase Strength and Stamina Yes       Intervention Provide advice, education, support and counseling about physical activity/exercise needs.;Develop an individualized exercise prescription for aerobic and resistive training based on initial evaluation findings, risk stratification, comorbidities and participant's personal goals.       Expected Outcomes Short Term: Increase workloads from initial exercise prescription for resistance, speed, and METs.;Short  Term: Perform resistance training exercises routinely during rehab and add in resistance training at home;Long Term: Improve cardiorespiratory fitness, muscular endurance and strength as measured by increased METs and functional capacity (6MWT)       Able to understand and use rate of perceived exertion (RPE) scale Yes       Intervention Provide education and explanation on how to use RPE scale       Expected Outcomes Short Term: Able to use RPE daily in rehab to express subjective intensity level;Long Term:  Able to use RPE to guide  intensity level when exercising independently       Able to understand and use Dyspnea scale Yes       Intervention Provide education and explanation on how to use Dyspnea scale       Expected Outcomes Short Term: Able to use Dyspnea scale daily in rehab to express subjective sense of shortness of breath during exertion;Long Term: Able to use Dyspnea scale to guide intensity level when exercising independently       Knowledge and understanding of Target Heart Rate Range (THRR) Yes       Intervention Provide education and explanation of THRR including how the numbers were predicted and where they are located for reference       Expected Outcomes Short Term: Able to state/look up THRR;Short Term: Able to use daily as guideline for intensity in rehab;Long Term: Able to use THRR to govern intensity when exercising independently       Able to check pulse independently Yes       Intervention Provide education and demonstration on how to check pulse in carotid and radial arteries.;Review the importance of being able to check your own pulse for safety during independent exercise       Expected Outcomes Short Term: Able to explain why pulse checking is important during independent exercise;Long Term: Able to check pulse independently and accurately       Understanding of Exercise Prescription Yes       Intervention Provide education, explanation, and written materials on patient's  individual exercise prescription       Expected Outcomes Short Term: Able to explain program exercise prescription;Long Term: Able to explain home exercise prescription to exercise independently                Exercise Goals Re-Evaluation :  Exercise Goals Re-Evaluation     [place] Name 02/03/21 1425 02/17/21 1504 03/04/21 1430 03/17/21 1420       Exercise Goal Re-Evaluation   Exercise Goals Review Increase Physical Activity;Able to understand and use rate of perceived exertion (RPE) scale;Knowledge and understanding of Target Heart Rate Range (THRR);Understanding of Exercise Prescription;Increase Strength and Stamina;Able to understand and use Dyspnea scale;Able to check pulse independently Increase Physical Activity;Increase Strength and Stamina Increase Physical Activity;Increase Strength and Stamina;Understanding of Exercise Prescription Increase Physical Activity;Increase Strength and Stamina    Comments Reviewed RPE and dyspnea scales, THR and program prescription with pt today.  Pt voiced understanding and was given a copy of goals to take home. Monica Bell is working on building up to prescribed speed on TM.  She reaches THR range.  We will continue to monitor progress. Monica Bell is doing well in rehab. She is now up to 3.5% grade on the treadmill and 11 watts on the bike.  We will continue to monitor her progress. Monica Bell is doing some sit ups outside program sessions.  EP staff will review home exercise    Expected Outcomes Short: Use RPE daily to regulate intensity. Long: Follow program prescription in THR. Short:  continue to build stamina on TM Long: reach prescribed speed Short: Continue to increase workloads Long: Continue to improve stamina Short: add one day in addition to program sessions Long: maintain exercise on her own             Discharge Exercise Prescription (Final Exercise Prescription Changes):  Exercise Prescription Changes - 03/04/21 1400       Response to Exercise    Blood Pressure (Admit) 142/62    Blood Pressure (Exercise)  160/60    Blood Pressure (Exit) 128/56    Heart Rate (Admit) 70 bpm    Heart Rate (Exercise) 110 bpm    Heart Rate (Exit) 87 bpm    Rating of Perceived Exertion (Exercise) 15    Symptoms none    Duration Continue with 30 min of aerobic exercise without signs/symptoms of physical distress.    Intensity THRR unchanged      Progression   Progression Continue to progress workloads to maintain intensity without signs/symptoms of physical distress.    Average METs 2.76      Resistance Training   Training Prescription Yes    Weight 3 lb    Reps 10-15      Interval Training   Interval Training No      Treadmill   MPH 1.7    Grade 3.5    Minutes 15    METs 3.12      Recumbant Bike   Level 1    Minutes 15    METs 2.39      NuStep   Level 1    Minutes 15      Oxygen   Maintain Oxygen Saturation 88% or higher             Nutrition:  Target Goals: Understanding of nutrition guidelines, daily intake of sodium <1590m, cholesterol <2058m calories 30% from fat and 7% or less from saturated fats, daily to have 5 or more servings of fruits and vegetables.  Education: All About Nutrition: -Group instruction provided by verbal, written material, interactive activities, discussions, models, and posters to present general guidelines for heart healthy nutrition including fat, fiber, MyPlate, the role of sodium in heart healthy nutrition, utilization of the nutrition label, and utilization of this knowledge for meal planning. Follow up email sent as well. Written material given at graduation. Flowsheet [facility]  Education need identified 01/30/21       Biometrics:  Pre Biometrics - 01/30/21 1650       Pre Biometrics   Height _0  (1.803 m)    Weight 184 lb 1.6 oz (83.5 kg)    BMI (Calculated) 25.69    Single Leg Stand 8.9 seconds               Nutrition Therapy Plan and Nutrition Goals:  Nutrition Therapy & Goals - 03/24/21 1500       Nutrition Therapy   RD appointment deferred Yes   pt would not like to meet with dietitian at this time, will continue to follow up     Personal Nutrition Goals   Comments pt would not like to meet with dietitian at this time, will continue to follow up             Nutrition Assessments:  MEDIFICTS Score Key: ?70 Need to make dietary changes  40-70 Heart Healthy Diet ? 40 Therapeutic Level Cholesterol Diet  [facility]  Picture Your Plate Total Score on Admission 51      Picture Your Plate Scores: <4<25nhealthy dietary pattern with much room for improvement. 41-50 Dietary pattern unlikely to meet recommendations for good health and room for improvement. 51-60 More healthful dietary pattern, with some room for improvement.  >60 Healthy dietary pattern, although there may be some specific behaviors that could be improved.    Nutrition Goals Re-Evaluation:  Nutrition Goals Re-Evaluation  [place] Name 03/17/21 1421             Goals   Comment Monica Bell has not met with RD yet       Expected Outcome ST: schedule and meet with RD                Nutrition Goals Discharge (Final Nutrition Goals Re-Evaluation):  Nutrition Goals Re-Evaluation - 03/17/21 1421       Goals   Comment Monica Bell has not met with RD yet    Expected Outcome ST: schedule and meet with RD             Psychosocial: Target Goals: Acknowledge presence or absence of significant depression and/or stress, maximize coping skills, provide positive support system. Participant is able to verbalize types and ability to use techniques and skills needed for reducing stress and depression.   Education: Stress, Anxiety, and Depression - Group verbal and visual presentation to define topics covered.  Reviews how body is impacted by stress,  anxiety, and depression.  Also discusses healthy ways to reduce stress and to treat/manage anxiety and depression.  Written material given at graduation.   Education: Sleep Hygiene -Provides group verbal and written instruction about how sleep can affect your health.  Define sleep hygiene, discuss sleep cycles and impact of sleep habits. Review good sleep hygiene tips.    Initial Review & Psychosocial Screening:  Initial Psych Review & Screening - 01/17/21 1509       Initial Review   Current issues with Current Stress Concerns    Source of Stress Concerns Occupation      [place]? Yes   daughter     Barriers   Psychosocial barriers to participate in program There are no identifiable barriers or psychosocial needs.;The patient should benefit from training in stress management and relaxation.      Screening Interventions   Interventions Encouraged to exercise;Provide feedback about the scores to participant;To provide support and resources with identified psychosocial needs    Expected Outcomes Short Term goal: Utilizing psychosocial counselor, staff and physician to assist with identification of specific Stressors or current issues interfering with healing process. Setting desired goal for each stressor or current issue identified.;Long Term Goal: Stressors or current issues are controlled or eliminated.;Short Term goal: Identification and review with participant of any Quality of Life or Depression concerns found by scoring the questionnaire.;Long Term goal: The participant improves quality of Life and PHQ9 Scores as seen by post scores and/or verbalization of changes             Quality of Life Scores:   Quality of Life - 01/30/21 1653       Quality of Life   Select Quality of Life      Quality of Life Scores   Health/Function Pre 17.92 %    Socioeconomic Pre 15.38 %    Psych/Spiritual Pre 19.71 %    Family Pre 22.6 %    GLOBAL Pre 18.39 %             Scores of 19 and below usually indicate a poorer quality of life in these areas.  A difference of  2-3 points is a clinically meaningful difference.  A difference of 2-3 points in the total score of the Quality of Life Index has been associated with significant improvement in overall quality of life, self-image, physical symptoms, and general health in studies assessing change in quality of life.  PHQ-9: Recent  Review Flowsheet Data     Depression screen [facility] 2/9 03/17/2021 03/03/2021 01/30/2021   Decreased Interest 0 2 1   Down, Depressed, Hopeless 0 0 1   PHQ - 2 Score 0 2 2   Altered sleeping _0 Tired, decreased energy _1 Change in appetite _2 Feeling bad or failure about yourself  0 0 0   Trouble concentrating 0 1 -   Moving slowly or fidgety/restless 0 0 2   Suicidal thoughts 0 0 0   PHQ-9 Score _3 Difficult doing work/chores Somewhat difficult Somewhat difficult Somewhat difficult      Interpretation of Total Score  Total Score Depression Severity:  1-4 = Minimal depression, 5-9 = Mild depression, 10-14 = Moderate depression, 15-19 = Moderately severe depression, 20-27 = Severe depression   Psychosocial Evaluation and Intervention:  Psychosocial Evaluation - 03/17/21 1417       Psychosocial Evaluation & Interventions   Comments Review PHQ 9 with patient.  Score improved to a 3.  She feels stopping ozempic helped her feel better.  She has gone back to work part time.  She feels good with the part time hours.  She will let staff know if she needs info on vocational rehab    Expected Outcomes Short:attend rehab for the exercise and stress education Long: maintain good self care habits             Psychosocial Re-Evaluation:  Psychosocial Re-Evaluation     [place] Name 03/03/21 1437 03/17/21 1413           Psychosocial Re-Evaluation   Current issues with Current Stress Concerns Current Stress Concerns      Comments Reviewed patient  health questionnaire (PHQ-9) with patient for follow up. Previously, patients score indicated signs/symptoms of depression.  Reviewed to see if patient is improving symptom wise while in program.  Score declined and patient states that it is because she has not been able to sleep well and she does not feel good after she eats a bit of food. --      Expected Outcomes Short: Continue to work toward an improvement in [place] scores by attending [facility] regularly. Long: Continue to improve stress and depression coping skills by talking with staff and attendind [facility] regularly and work toward a positive mental state. --      Interventions Encouraged to attend Cardiac Rehabilitation for the exercise --      Continue Psychosocial Services  Follow up required by staff --               Psychosocial Discharge (Final Psychosocial Re-Evaluation):  Psychosocial Re-Evaluation - 03/17/21 1413       Psychosocial Re-Evaluation   Current issues with Current Stress Concerns             Vocational Rehabilitation: Provide vocational rehab assistance to qualifying candidates.   Vocational Rehab Evaluation & Intervention:  Vocational Rehab - 01/17/21 1508       Initial Vocational Rehab Evaluation & Intervention   Assessment shows need for Vocational Rehabilitation No             Education: Education Goals: Education classes will be provided on a variety of topics geared toward better understanding of heart health and risk factor modification. Participant will state understanding/return demonstration of topics presented as noted by education test scores.  Learning Barriers/Preferences:  Learning Barriers/Preferences - 01/17/21 1508  Learning Barriers/Preferences   Learning Barriers None    Learning Preferences None             General Cardiac Education Topics:  AED/CPR: - Group verbal and written instruction with the use of models to demonstrate the basic use of the AED  with the basic ABC's of resuscitation.   Anatomy and Cardiac Procedures: - Group verbal and visual presentation and models provide information about basic cardiac anatomy and function. Reviews the testing methods done to diagnose heart disease and the outcomes of the test results. Describes the treatment choices: Medical Management, Angioplasty, or Coronary Bypass Surgery for treating various heart conditions including Myocardial Infarction, Angina, Valve Disease, and Cardiac Arrhythmias.  Written material given at graduation. Flowsheet Row Cardiac Rehab from 02/26/2021 in [facility] Cardiac and Pulmonary Rehab  Date 02/26/21  Educator sb  Instruction Review Code 1- Verbalizes Understanding       Medication Safety: - Group verbal and visual instruction to review commonly prescribed medications for heart and lung disease. Reviews the medication, class of the drug, and side effects. Includes the steps to properly store meds and maintain the prescription regimen.  Written material given at graduation.   Intimacy: - Group verbal instruction through game format to discuss how heart and lung disease can affect sexual intimacy. Written material given at graduation..   Know Your Numbers and Heart Failure: - Group verbal and visual instruction to discuss disease risk factors for cardiac and pulmonary disease and treatment options.  Reviews associated critical values for Overweight/Obesity, Hypertension, Cholesterol, and Diabetes.  Discusses basics of heart failure: signs/symptoms and treatments.  Introduces Heart Failure Zone chart for action plan for heart failure.  Written material given at graduation.   Infection Prevention: - Provides verbal and written material to individual with discussion of infection control including proper hand washing and proper equipment cleaning during exercise session. Flowsheet Row Cardiac Rehab from 02/26/2021 in [facility] Cardiac and Pulmonary Rehab  Date 01/30/21  Educator  AS  Instruction Review Code 1- Verbalizes Understanding       Falls Prevention: - Provides verbal and written material to individual with discussion of falls prevention and safety. Flowsheet Row Cardiac Rehab from 02/26/2021 in [facility] Cardiac and Pulmonary Rehab  Date 01/30/21  Educator AS  Instruction Review Code 1- Verbalizes Understanding       Other: -Provides group and verbal instruction on various topics (see comments)   Knowledge Questionnaire Score:  Knowledge Questionnaire Score - 01/30/21 1732       Knowledge Questionnaire Score   Pre Score 18/26             Core Components/Risk Factors/Patient Goals at Admission:  Personal Goals and Risk Factors at Admission - 01/30/21 1652       Core Components/Risk Factors/Patient Goals on Admission   Tobacco Cessation Yes    Number of packs per day Quit August 2022    Intervention Offer self-teaching materials, assist with locating and accessing local/national Quit Smoking programs, and support quit date choice.;Assist the participant in steps to quit. Provide individualized education and counseling about committing to Tobacco Cessation, relapse prevention, and pharmacological support that can be provided by physician.    Expected Outcomes Short Term: Will demonstrate readiness to quit, by selecting a quit date.;Short Term: Will quit all tobacco product use, adhering to prevention of relapse plan.;Long Term: Complete abstinence from all tobacco products for at least 12 months from quit date.    Diabetes Yes    Intervention Provide education  about signs/symptoms and action to take for hypo/hyperglycemia.;Provide education about proper nutrition, including hydration, and aerobic/resistive exercise prescription along with prescribed medications to achieve blood glucose in normal ranges: Fasting glucose 65-99 mg/dL    Expected Outcomes Short Term: Participant verbalizes understanding of the signs/symptoms and immediate care of  hyper/hypoglycemia, proper foot care and importance of medication, aerobic/resistive exercise and nutrition plan for blood glucose control.;Long Term: Attainment of HbA1C < 7%.    Hypertension Yes    Intervention Provide education on lifestyle modifcations including regular physical activity/exercise, weight management, moderate sodium restriction and increased consumption of fresh fruit, vegetables, and low fat dairy, alcohol moderation, and smoking cessation.;Monitor prescription use compliance.    Expected Outcomes Short Term: Continued assessment and intervention until BP is < 140/41m HG in hypertensive participants. < 130/84mHG in hypertensive participants with diabetes, heart failure or chronic kidney disease.;Long Term: Maintenance of blood pressure at goal levels.    Lipids Yes    Intervention Provide education and support for participant on nutrition & aerobic/resistive exercise along with prescribed medications to achieve LDL <7065mHDL >32m108m  Expected Outcomes Short Term: Participant states understanding of desired cholesterol values and is compliant with medications prescribed. Participant is following exercise prescription and nutrition guidelines.;Long Term: Cholesterol controlled with medications as prescribed, with individualized exercise RX and with personalized nutrition plan. Value goals: LDL < 70mg83mL > 40 mg.             Education:Diabetes - Individual verbal and written instruction to review signs/symptoms of diabetes, desired ranges of glucose level fasting, after meals and with exercise. Acknowledge that pre and post exercise glucose checks will be done for 3 sessions at entry of program. [place] [facility]  Date 01/17/21  Educator MC  [facility] Review Code 1- Verbalizes Understanding       Core Components/Risk Factors/Patient Goals Review:   Goals and Risk Factor Review     Row Name 03/17/21 1407              Core Components/Risk Factors/Patient Goals Review   Personal Goals Review Tobacco Cessation;Diabetes;Hypertension       Review TeresGeniyahins tobacco free! She still has cravings.  She says she can go on until her cravings pass.  She does check her BG in the morning - usually around 106.  She had a dizzy spell and didnt feel good at work last week.  She didnt check BG or BP as she was at work.  This was right after she had been sick.  She hasnt had any more episodes.  We reviewed the importance of keeping an eye on BP and BG.  She reports taking all medications as directed.       Expected Outcomes Short:  continue to monitor risk factors and maintain tobacco cessation Long: manage risk factors long term                Core Components/Risk Factors/Patient Goals at Discharge (Final Review):   Goals and Risk Factor Review - 03/17/21 1407       Core Components/Risk Factors/Patient Goals Review   Personal Goals Review Tobacco Cessation;Diabetes;Hypertension    Review TeresKesleighins tobacco free! She still has cravings.  She says she can go on until her cravings pass.  She does check her BG in the morning - usually around 106.  She had a dizzy spell and didnt feel good at work last week.  She  didnt check BG or BP as she was at work.  This was right after she had been sick.  She hasnt had any more episodes.  We reviewed the importance of keeping an eye on BP and BG.  She reports taking all medications as directed.    Expected Outcomes Short:  continue to monitor risk factors and maintain tobacco cessation Long: manage risk factors long term             ITP Comments:  ITP Comments     Row Name 01/17/21 1517 01/30/21 1733 02/03/21 1425 02/26/21 0706 03/26/21 0653   ITP Comments Initial telephone orientation completed. Diagnosis can be found in [facility] 8/11. EP orientation scheduled for Thursday 10/6 at 3pm. Completed 6MWT and gym orientation. Initial ITP created and sent for review  to Dr. Emily Filbert, Medical Director. First full day of exercise!  Patient was oriented to gym and equipment including functions, settings, policies, and procedures.  Patient's individual exercise prescription and treatment plan were reviewed.  All starting workloads were established based on the results of the 6 minute walk test done at initial orientation visit.  The plan for exercise progression was also introduced and progression will be customized based on patient's performance and goals. 30 Day review completed. Medical Director ITP review done, changes made as directed, and signed approval by Medical Director. 30 Day review completed. Medical Director ITP review done, changes made as directed, and signed approval by Medical Director.            Comments:

## 2021-03-27 ENCOUNTER — Encounter: Payer: BC Managed Care – PPO | Attending: Cardiology | Admitting: *Deleted

## 2021-03-27 ENCOUNTER — Other Ambulatory Visit: Payer: Self-pay

## 2021-03-27 DIAGNOSIS — Z48812 Encounter for surgical aftercare following surgery on the circulatory system: Secondary | ICD-10-CM | POA: Insufficient documentation

## 2021-03-27 DIAGNOSIS — I252 Old myocardial infarction: Secondary | ICD-10-CM | POA: Diagnosis not present

## 2021-03-27 DIAGNOSIS — I214 Non-ST elevation (NSTEMI) myocardial infarction: Secondary | ICD-10-CM

## 2021-03-27 DIAGNOSIS — Z955 Presence of coronary angioplasty implant and graft: Secondary | ICD-10-CM | POA: Diagnosis not present

## 2021-03-27 NOTE — Progress Notes (Signed)
Daily Session Note  Patient Details  Name: Monica Bell MRN: [id number] Date of Birth: July 03, 1960 Referring Provider:   Flowsheet Row Cardiac Rehab from 01/30/2021 in [facility] Cardiac and Pulmonary Rehab  Referring Provider Patwardhan       Encounter Date: 03/27/2021  Check In:  Session Check In - 03/27/21 1354       Check-In   Supervising physician immediately available to respond to emergencies See telemetry face sheet for immediately available ER MD    Location [facility]-Cardiac & Pulmonary Rehab    Staff Present Renita Papa, RN BSN;Joseph [place], RCP,RRT,BSRT;Jessica Renningers, [place], RCEP, CCRP, CCET    Virtual Visit No    Medication changes reported     No    Fall or balance concerns reported    No    Warm-up and Cool-down Performed on first and last piece of equipment    Resistance Training Performed Yes    VAD Patient? No    PAD/SET Patient? No      Pain Assessment   Currently in Pain? No/denies                Social History   Tobacco Use  Smoking Status Former   Packs/day: 0.50   Years: 15.00   Pack years: 7.50   Types: Cigarettes   Quit date: 11/2020   Years since quitting: 0.3  Smokeless Tobacco Never    Goals Met:  Independence with exercise equipment Exercise tolerated well No report of concerns or symptoms today Strength training completed today  Goals Unmet:  Not Applicable  Comments: Pt able to follow exercise prescription today without complaint.  Will continue to monitor for progression.    Dr. Emily Filbert is Medical Director for [place].  Dr. Ottie Glazier is Medical Director for [facility] Pulmonary Rehabilitation.

## 2021-03-28 ENCOUNTER — Ambulatory Visit: Payer: BC Managed Care – PPO | Admitting: Cardiology

## 2021-03-28 ENCOUNTER — Encounter: Payer: Self-pay | Admitting: Cardiology

## 2021-03-28 ENCOUNTER — Other Ambulatory Visit: Payer: Self-pay

## 2021-03-28 VITALS — BP 163/69 | HR 73 | Temp 98.0°F | Resp 16 | Ht 71.0 in | Wt 185.0 lb

## 2021-03-28 DIAGNOSIS — I10 Essential (primary) hypertension: Secondary | ICD-10-CM

## 2021-03-28 DIAGNOSIS — I25118 Atherosclerotic heart disease of native coronary artery with other forms of angina pectoris: Secondary | ICD-10-CM

## 2021-03-28 DIAGNOSIS — E1165 Type 2 diabetes mellitus with hyperglycemia: Secondary | ICD-10-CM

## 2021-03-28 DIAGNOSIS — I5020 Unspecified systolic (congestive) heart failure: Secondary | ICD-10-CM

## 2021-03-28 DIAGNOSIS — E782 Mixed hyperlipidemia: Secondary | ICD-10-CM

## 2021-03-28 MED ORDER — RANOLAZINE ER 500 MG PO TB12: 500.0000 mg | ORAL_TABLET | Freq: Two times a day (BID) | ORAL | 3 refills | Status: DC

## 2021-03-28 NOTE — Progress Notes (Signed)
Patient referred by [name], NP for coronary artery disease  Subjective:   Monica Bell, female    DOB: 12-21-1960, 61 y.o.   MRN: [id number]  Chief Complaint  Patient presents with   Coronary Artery Disease   Chest Pain   Results    echo   Follow-up    4 week     HPI  60 year old African-American female with hypertension, hyperlipidemia, type 2 diabetes mellitus, tobacco dependence, CAD, HFrEF (EF 40-45%)  Patient is working with cardiac rehab.  She has good days and bad days with chest pain.  Sometimes, she has pain after walking for 15 minutes on treadmill.  She generally does not have pain while riding stationary bicycle.  Reviewed recent echocardiogram results with the patient, details below.  Briefly, EF has normalized.  Current Outpatient Medications on File Prior to Visit  Medication Sig Dispense Refill   amLODipine (NORVASC) 5 MG tablet Take 1 tablet (5 mg total) by mouth daily. 30 tablet 3   BRILINTA 90 MG TABS tablet TAKE 1 TABLET BY MOUTH TWICE DAILY 60 tablet 0   Calcium Carbonate-Vitamin D (CALTRATE 600+D PO) Take 1 tablet by mouth daily.     cetirizine (ZYRTEC) 10 MG tablet Take 10 mg by mouth daily.     cholecalciferol (VITAMIN D3) 25 MCG (1000 UNIT) tablet Take 1,000 Units by mouth daily.     GNP ASPIRIN LOW DOSE 81 MG EC tablet TAKE 1 TABLET BY MOUTH ONCE DAILY. SWALLOW WHOLE. 90 tablet 1   Investigational - Study Medication EMPACT-MI Study  Empagliflozin /or Placebo     isosorbide-hydrALAZINE (BIDIL) 20-37.5 MG tablet Take 1 tablet by mouth 3 (three) times daily. 90 tablet 3   ivabradine (CORLANOR) 5 MG TABS tablet Take 1 tablet (5 mg total) by mouth 2 (two) times daily with a meal. 60 tablet 2   levothyroxine (SYNTHROID) 137 MCG tablet Take 137 mcg by mouth daily.     losartan (COZAAR) 50 MG tablet Take 1 tablet by mouth daily.     magnesium oxide (MAG-OX) 400 MG tablet Take 400 mg by mouth daily.     metFORMIN (GLUCOPHAGE) 1000 MG tablet  Take 1,000 mg by mouth 2 (two) times daily with a meal.     metoprolol succinate (TOPROL-XL) 50 MG 24 hr tablet Take 1 tablet (50 mg total) by mouth daily. Take with or immediately following a meal. 30 tablet 0   Multiple Minerals-Vitamins (CAL-MAG-ZINC-D PO) Take by mouth.     nitroGLYCERIN (NITROSTAT) 0.4 MG SL tablet Place 1 tablet (0.4 mg total) under the tongue every 5 (five) minutes as needed for chest pain. 30 tablet 3   omeprazole (PRILOSEC) 20 MG capsule Take 20 mg by mouth daily.     rosuvastatin (CRESTOR) 40 MG tablet Take 1 tablet (40 mg total) by mouth daily. 30 tablet 0   TRESIBA FLEXTOUCH 200 UNIT/ML FlexTouch Pen Inject 45 Units into the skin daily.     vitamin B-12 (CYANOCOBALAMIN) 100 MCG tablet Take 100 mcg by mouth daily.     No current facility-administered medications on file prior to visit.    Cardiovascular and other pertinent studies:  Echocardiogram 03/18/2021:  Normal LV systolic function with visual EF 60-65%. Left ventricle cavity  is normal in size. Moderate left ventricular hypertrophy. Normal global  wall motion. Indeterminate diastolic filling pattern, indeterminate LAP.  No significant valvular disease.  Small pericardial effusion. There is no hemodynamic significance.  Compared to study 12/06/2020: LVEF improved  from 40-45% to 60-65%  otherwise no significant change.   Lexiscan Sestamibi stress test 01/15/2021: Lexiscan nuclear stress test performed using 1-day protocol. Stress EKG is non-diagnostic, as this is pharmacological stress test. In addition, stress EKG at 78% MPHR showed sinus tachycardia, 1 mm inferolateral downsloping ST depression, that normalize 1 min into recovery.  Normal myocardial perfusion. Stress LVEF 35% with moderate global decrease in myocardial thickening and wall motion. High risk study due to low stress LVEF.  EKG 12/10/2020: Sinus rhythm 65 bpm Possible old anteroseptal infarct Inferolateral T wave inversion,  nonspecific   Coronary intervention 12/06/2020: LM: Normal LAD: No significant disease Lcx: Prox thrombotic 95% stenosis, 60% ostial OM1 stenosis        Mid Lcx occlusion, likely chronic RCA: Mid 60% disease. Right-to-left collaterals to occluded Lcx   Successful percutaneous coronary intervention prox Lcx-OM1        PTCA and stent placement 3.0 X 30 mm Onyx Frontier drug-eluting  stent, post dilatation with 3.25X8 and 3.5X15 mm [place] balloons up to 20 atm   Distal embolization into small branches of OM1. Aggrastat bolus and infusion started.  Echocardiogram 12/06/2020:  1. LVEF is depressed with hypokinesis of the distal anterior, basal  inferior and apical walls and severe hypokinesis of the distal lateral  walls. LVEF 40 to 45%. Left ventricular diastolic parameters are  consistent with Grade I diastolic dysfunction  (impaired relaxation). Elevated left atrial pressure.   2. Right ventricular systolic function is normal. The right ventricular  size is normal.   3. Soft tissue density epicardially, seen most prominently inferior to  RV. May represent adipose tissue, cannot exclude inflammation with  consolidation. No old images to compare . a small pericardial effusion is  present.   4. The mitral valve is normal in structure. No evidence of mitral valve  regurgitation.   5. The aortic valve is normal in structure. Aortic valve regurgitation is  not visualized.   6. The inferior vena cava is normal in size with greater than 50%  respiratory variability, suggesting right atrial pressure of 3 mmHg.    Recent labs: 01/02/2021: (After discontiuing Entresto) Glucose 269, BUN/Cr 9/1.26. EGFR 49. Na/K 139/4.1.   12/24/2020: (On Entresto 24-26 mg bid) Glucose 243, BUN/Cr 11/1.82. EGFR 32. Na/K 132/3.8.   12/16/2020: (On Entresto 49-51 mg bid) Glucose 195, BUN/Cr 11/1.47. EGFR 41. Na/K 139/4.1.   12/07/2020: (On Entresto 24-26 mg bid) Glucose 180, BUN/Cr 10/0.87. EGFR >60. Na/K 136/4.4.   H/H 11.7/35.7. MCV 87. Platelets 370 HbA1C 9.4% Chol 201, TG 78, HDL 51, LDL 134 TSH N/A  Results for Monica Bell, Monica Bell (MRN [id number]) as of 12/10/2020 11:10  Ref. Range 12/06/2020 01:39 12/06/2020 05:03 12/06/2020 07:46 12/06/2020 10:07  Troponin I (High Sensitivity) Latest Ref Range: <18 ng/L 149 (HH) 396 (HH) 734 (HH) 1,050 (HH)     Review of Systems  Constitutional: Positive for malaise/fatigue.  Cardiovascular:  Positive for chest pain (Different from her angina at the time of MI). Negative for dyspnea on exertion, leg swelling, palpitations and syncope.       Vitals:   03/28/21 1420  BP: (!) 163/69  Pulse: 73  Resp: 16  Temp: 98 F (36.7 C)  SpO2: 100%      Body mass index is 25.8 kg/m. Filed Weights   03/28/21 1420  Weight: 185 lb (83.9 kg)     Objective:   Physical Exam Vitals and nursing note reviewed.  Constitutional:      General: She is not  in acute distress. Neck:     Vascular: No JVD.  Cardiovascular:     Rate and Rhythm: Normal rate and regular rhythm.     Pulses: Decreased pulses.     Heart sounds: Normal heart sounds. No murmur heard. Pulmonary:     Effort: Pulmonary effort is normal.     Breath sounds: Normal breath sounds. No wheezing or rales.  Musculoskeletal:     Right lower leg: No edema.     Left lower leg: No edema.        Assessment & Recommendations:   60 year old African-American female with hypertension, hyperlipidemia, type 2 diabetes mellitus, tobacco dependence, admitted with NSTEMI   CAD: NSTEMI 11/2020. Culprit: thrombotic 95% stenosis Prox Lcx into large OM1 Non-culprit: Mid Lcx chronic occlusion with R-to-L grade 3 collaterals                     Mid RCA 60% stenosis Recommend DAPT with Aspirin and Brilinta till 11/2021 Metoprolol succinate 50 mg daily Crestor 40 mg daily Aggressive diabetes management with PCP (A1C is 9%) No ischemia, low stress LVEF 35% (SPECT MPI 12/2020)  Her residual angina symptoms are  likely from CT of her left circumflex.  Given her stable angina symptoms, absence of ischemia on stress testing, as well as normalization of EF on echocardiogram, I would continue medical management for now.  I Added Ranexa 5 mg daily.  Continue his other antianginal  therapy.  Patient is reluctant to consider CTO PCI at this time.  HFrEF: Clinically euvolumic. LVEDP minimally elevated at 16 mmhg on cath EF 40-45% with Lcx/OM territory WMA (11/2020) Entresto discontinued due to AKI Continue metoprolol succinate 50 mg daily Continue Bidil 20-37.5 mg tid. Encourage hydration. Continue Corlanor 5 mg bid. Enrolled in EMPACT-MI clinical trial (Empaglifozin vs placebo in post MI heart failure patients).  Consider using non-SGLT2i for diabetes management.   EF improved to 55-60% in 02/2021   Hypertension: As above   Hyperlipidemia: Uncontrolled. LDL 134.  Currently on Crestor 40 mg daily.  Check lipid panel.   If LDL remains >70, will add Repatha.   Type 2 DM: Uncontrolled. Defer management to primary team.  Enrolled in EMPACT-MI clinical trial (Empaglifozin vs placebo in post MI heart failure patients).  Consider using non-SGLT2i for diabetes management.    F/u in 3 months    Nigel Mormon, MD Pager: [phone] Office: [phone]

## 2021-04-02 ENCOUNTER — Ambulatory Visit: Payer: BC Managed Care – PPO | Admitting: Cardiology

## 2021-04-07 ENCOUNTER — Other Ambulatory Visit: Payer: Self-pay

## 2021-04-07 DIAGNOSIS — Z955 Presence of coronary angioplasty implant and graft: Secondary | ICD-10-CM

## 2021-04-07 DIAGNOSIS — I214 Non-ST elevation (NSTEMI) myocardial infarction: Secondary | ICD-10-CM

## 2021-04-07 DIAGNOSIS — Z48812 Encounter for surgical aftercare following surgery on the circulatory system: Secondary | ICD-10-CM | POA: Diagnosis not present

## 2021-04-07 NOTE — Progress Notes (Signed)
Daily Session Note  Patient Details  Name: Monica Bell MRN: [id number] Date of Birth: 28-Feb-1961 Referring Provider:   Flowsheet Row Cardiac Rehab from 01/30/2021 in [facility] Cardiac and Pulmonary Rehab  Referring Provider Patwardhan       Encounter Date: 04/07/2021  Check In:  Session Check In - 04/07/21 1404       Check-In   Supervising physician immediately available to respond to emergencies See telemetry face sheet for immediately available ER MD    Location [facility]-Cardiac & Pulmonary Rehab    Staff Present Birdie Sons, MPA, RN;Joseph Garnette Czech [place], MS, ASCM CEP, Exercise Physiologist    Virtual Visit No    Medication changes reported     No    Fall or balance concerns reported    No    Tobacco Cessation No Change    Warm-up and Cool-down Performed on first and last piece of equipment    Resistance Training Performed Yes    VAD Patient? No    PAD/SET Patient? No      Pain Assessment   Currently in Pain? No/denies                Social History   Tobacco Use  Smoking Status Former   Packs/day: 0.50   Years: 15.00   Pack years: 7.50   Types: Cigarettes   Quit date: 11/2020   Years since quitting: 0.3  Smokeless Tobacco Never    Goals Met:  Independence with exercise equipment Exercise tolerated well No report of concerns or symptoms today Strength training completed today  Goals Unmet:  Not Applicable  Comments: Pt able to follow exercise prescription today without complaint.  Will continue to monitor for progression.    Dr. Emily Filbert is Medical Director for [place].  Dr. Ottie Glazier is Medical Director for [facility] Pulmonary Rehabilitation.

## 2021-04-09 ENCOUNTER — Other Ambulatory Visit: Payer: Self-pay

## 2021-04-09 DIAGNOSIS — I214 Non-ST elevation (NSTEMI) myocardial infarction: Secondary | ICD-10-CM

## 2021-04-09 DIAGNOSIS — Z48812 Encounter for surgical aftercare following surgery on the circulatory system: Secondary | ICD-10-CM | POA: Diagnosis not present

## 2021-04-09 DIAGNOSIS — Z955 Presence of coronary angioplasty implant and graft: Secondary | ICD-10-CM

## 2021-04-09 NOTE — Progress Notes (Signed)
Daily Session Note  Patient Details  Name: Monica Bell MRN: [id number] Date of Birth: 06/17/1960 Referring Provider:   Flowsheet Row Cardiac Rehab from 01/30/2021 in [facility] Cardiac and Pulmonary Rehab  Referring Provider Patwardhan       Encounter Date: 04/09/2021  Check In:  Session Check In - 04/09/21 1405       Check-In   Supervising physician immediately available to respond to emergencies See telemetry face sheet for immediately available ER MD    Location [facility]-Cardiac & Pulmonary Rehab    Staff Present Birdie Sons, MPA, Nino Glow, MS, ASCM CEP, Exercise Physiologist;Joseph Tessie Fass, [place]    Virtual Visit No    Medication changes reported     No    Fall or balance concerns reported    No    Tobacco Cessation No Change    Warm-up and Cool-down Performed on first and last piece of equipment    Resistance Training Performed Yes    VAD Patient? No    PAD/SET Patient? No      Pain Assessment   Currently in Pain? No/denies                Social History   Tobacco Use  Smoking Status Former   Packs/day: 0.50   Years: 15.00   Pack years: 7.50   Types: Cigarettes   Quit date: 11/2020   Years since quitting: 0.3  Smokeless Tobacco Never    Goals Met:  Independence with exercise equipment Exercise tolerated well No report of concerns or symptoms today Strength training completed today  Goals Unmet:  Not Applicable  Comments: Pt able to follow exercise prescription today without complaint.  Will continue to monitor for progression.    Dr. Emily Filbert is Medical Director for [place].  Dr. Ottie Glazier is Medical Director for [facility] Pulmonary Rehabilitation.

## 2021-04-14 ENCOUNTER — Other Ambulatory Visit: Payer: Self-pay

## 2021-04-14 DIAGNOSIS — Z955 Presence of coronary angioplasty implant and graft: Secondary | ICD-10-CM

## 2021-04-14 DIAGNOSIS — Z48812 Encounter for surgical aftercare following surgery on the circulatory system: Secondary | ICD-10-CM | POA: Diagnosis not present

## 2021-04-14 DIAGNOSIS — I214 Non-ST elevation (NSTEMI) myocardial infarction: Secondary | ICD-10-CM

## 2021-04-14 NOTE — Progress Notes (Signed)
Daily Session Note  Patient Details  Name: Monica Bell MRN: [id number] Date of Birth: 1960-07-11 Referring Provider:   Flowsheet Row Cardiac Rehab from 01/30/2021 in [facility]  Referring Provider Patwardhan       Encounter Date: 04/14/2021  Check In:  Session Check In - 04/14/21 1425       Check-In   Supervising physician immediately available to respond to emergencies See telemetry face sheet for immediately available ER MD    Location [facility]    Staff Present Birdie Sons, MPA, Nino Glow, MS, ASCM CEP, Exercise Physiologist;Joseph Tessie Fass, [place]    Virtual Visit No    Medication changes reported     No    Fall or balance concerns reported    No    Tobacco Cessation No Change    Warm-up and Cool-down Performed on first and last piece of equipment    Resistance Training Performed Yes    VAD Patient? No    PAD/SET Patient? No      Pain Assessment   Currently in Pain? No/denies                Social History   Tobacco Use  Smoking Status Former   Packs/day: 0.50   Years: 15.00   Pack years: 7.50   Types: Cigarettes   Quit date: 11/2020   Years since quitting: 0.3  Smokeless Tobacco Never    Goals Met:  Independence with exercise equipment Exercise tolerated well No report of concerns or symptoms today Strength training completed today  Goals Unmet:  Not Applicable  Comments: Pt able to follow exercise prescription today without complaint.  Will continue to monitor for progression.    Dr. Emily Filbert is Medical Director for [facility].  Dr. Ottie Glazier is Medical Director for [facility].

## 2021-04-22 ENCOUNTER — Other Ambulatory Visit: Payer: Self-pay | Admitting: Cardiology

## 2021-04-23 ENCOUNTER — Encounter: Payer: Self-pay | Admitting: *Deleted

## 2021-04-23 DIAGNOSIS — Z955 Presence of coronary angioplasty implant and graft: Secondary | ICD-10-CM

## 2021-04-23 DIAGNOSIS — I214 Non-ST elevation (NSTEMI) myocardial infarction: Secondary | ICD-10-CM

## 2021-04-23 NOTE — Progress Notes (Signed)
Cardiac Individual Treatment Plan  Patient Details  Name: MAYMUNA DETZEL MRN: [id number] Date of Birth: 10-Aug-1960 Referring Provider:   Flowsheet Row Cardiac Rehab from 01/30/2021 in [facility] Cardiac and Pulmonary Rehab  Referring Provider Patwardhan       Initial Encounter Date:  Flowsheet Row Cardiac Rehab from 01/30/2021 in [facility] Cardiac and Pulmonary Rehab  Date 01/30/21       Visit Diagnosis: NSTEMI (non-ST elevated myocardial infarction) [facility])  Status post coronary artery stent placement  Patient's Home Medications on Admission:  Current Outpatient Medications:    amLODipine (NORVASC) 5 MG tablet, Take 1 tablet (5 mg total) by mouth daily., Disp: 30 tablet, Rfl: 3   BRILINTA 90 MG TABS tablet, TAKE 1 TABLET BY MOUTH TWICE DAILY, Disp: 60 tablet, Rfl: 11   Calcium Carbonate-Vitamin D (CALTRATE 600+D PO), Take 1 tablet by mouth daily., Disp: , Rfl:    cetirizine (ZYRTEC) 10 MG tablet, Take 10 mg by mouth daily., Disp: , Rfl:    cholecalciferol (VITAMIN D3) 25 MCG (1000 UNIT) tablet, Take 1,000 Units by mouth daily., Disp: , Rfl:    GNP ASPIRIN LOW DOSE 81 MG EC tablet, TAKE 1 TABLET BY MOUTH ONCE DAILY. SWALLOW WHOLE., Disp: 90 tablet, Rfl: 1   Investigational - Study Medication, EMPACT-MI Study  Empagliflozin /or Placebo, Disp: , Rfl:    isosorbide-hydrALAZINE (BIDIL) 20-37.5 MG tablet, Take 1 tablet by mouth 3 (three) times daily., Disp: 90 tablet, Rfl: 3   ivabradine (CORLANOR) 5 MG TABS tablet, Take 1 tablet (5 mg total) by mouth 2 (two) times daily with a meal., Disp: 60 tablet, Rfl: 2   levothyroxine (SYNTHROID) 137 MCG tablet, Take 137 mcg by mouth daily., Disp: , Rfl:    losartan (COZAAR) 50 MG tablet, Take 1 tablet by mouth daily., Disp: , Rfl:    magnesium oxide (MAG-OX) 400 MG tablet, Take 400 mg by mouth daily., Disp: , Rfl:    metFORMIN (GLUCOPHAGE) 1000 MG tablet, Take 1,000 mg by mouth 2 (two) times daily with a meal., Disp: , Rfl:    metoprolol succinate  (TOPROL-XL) 50 MG 24 hr tablet, Take 1 tablet (50 mg total) by mouth daily. Take with or immediately following a meal., Disp: 30 tablet, Rfl: 0   Multiple Minerals-Vitamins (CAL-MAG-ZINC-D PO), Take by mouth., Disp: , Rfl:    nitroGLYCERIN (NITROSTAT) 0.4 MG SL tablet, Place 1 tablet (0.4 mg total) under the tongue every 5 (five) minutes as needed for chest pain., Disp: 30 tablet, Rfl: 3   omeprazole (PRILOSEC) 20 MG capsule, Take 20 mg by mouth daily., Disp: , Rfl:    ranolazine (RANEXA) 500 MG 12 hr tablet, Take 1 tablet (500 mg total) by mouth 2 (two) times daily., Disp: 60 tablet, Rfl: 3   rosuvastatin (CRESTOR) 40 MG tablet, Take 1 tablet (40 mg total) by mouth daily., Disp: 30 tablet, Rfl: 0   TRESIBA FLEXTOUCH 200 UNIT/ML FlexTouch Pen, Inject 45 Units into the skin daily., Disp: , Rfl:    vitamin B-12 (CYANOCOBALAMIN) 100 MCG tablet, Take 100 mcg by mouth daily., Disp: , Rfl:   Past Medical History: Past Medical History:  Diagnosis Date   Abnormal mammogram    Allergic rhinitis due to pollen 08/01/2019   Arthritis 01/06/2019   osteoarthritis of both knees   BMI 28.0-28.9,adult 08/01/2019   Cystocele, unspecified (CODE)    Diabetes mellitus    GERD (gastroesophageal reflux disease)    Heart murmur    Hyperlipemia, mixed 12/14/2018   Hypertension  Hypothyroidism    Stress incontinence, female    Thyroid disease    Type 2 diabetes mellitus, without long-term current use of insulin ([place]) 01/06/2019   Vitamin D deficiency 01/06/2019    Tobacco Use: Social History   Tobacco Use  Smoking Status Former   Packs/day: 0.50   Years: 15.00   Pack years: 7.50   Types: Cigarettes   Quit date: 11/2020   Years since quitting: 0.4  Smokeless Tobacco Never    Labs: Recent Review Scientist, physiological     Labs for ITP Cardiac and Pulmonary Rehab Latest Ref Rng & Units 12/06/2020   Cholestrol 0 - 200 mg/dL 201(H)   LDLCALC 0 - 99 mg/dL 134(H)   HDL >40 mg/dL 51   Trlycerides <150  mg/dL 78   Hemoglobin A1c 4.8 - 5.6 % 9.4(H)        Exercise Target Goals: Exercise Program Goal: Individual exercise prescription set using results from initial 6 min walk test and THRR while considering  patients activity barriers and safety.   Exercise Prescription Goal: Initial exercise prescription builds to 30-45 minutes a day of aerobic activity, 2-3 days per week.  Home exercise guidelines will be given to patient during program as part of exercise prescription that the participant will acknowledge.   Education: Aerobic Exercise: - Group verbal and visual presentation on the components of exercise prescription. Introduces F.I.T.T principle from ACSM for exercise prescriptions.  Reviews F.I.T.T. principles of aerobic exercise including progression. Written material given at graduation.   Education: Resistance Exercise: - Group verbal and visual presentation on the components of exercise prescription. Introduces F.I.T.T principle from ACSM for exercise prescriptions  Reviews F.I.T.T. principles of resistance exercise including progression. Written material given at graduation. Flowsheet Row Cardiac Rehab from 04/09/2021 in [facility] Cardiac and Pulmonary Rehab  Date 02/26/21  Educator as  Instruction Review Code 1- Verbalizes Understanding        Education: Exercise & Equipment Safety: - Individual verbal instruction and demonstration of equipment use and safety with use of the equipment. Flowsheet Row Cardiac Rehab from 04/09/2021 in [facility] Cardiac and Pulmonary Rehab  Education need identified 01/30/21  Date 01/30/21  Educator AS  Instruction Review Code 1- Verbalizes Understanding       Education: Exercise Physiology & General Exercise Guidelines: - Group verbal and written instruction with models to review the exercise physiology of the cardiovascular system and associated critical values. Provides general exercise guidelines with specific guidelines to those with heart  or lung disease.    Education: Flexibility, Balance, Mind/Body Relaxation: - Group verbal and visual presentation with interactive activity on the components of exercise prescription. Introduces F.I.T.T principle from ACSM for exercise prescriptions. Reviews F.I.T.T. principles of flexibility and balance exercise training including progression. Also discusses the mind body connection.  Reviews various relaxation techniques to help reduce and manage stress (i.e. Deep breathing, progressive muscle relaxation, and visualization). Balance handout provided to take home. Written material given at graduation.   Activity Barriers & Risk Stratification:  Activity Barriers & Cardiac Risk Stratification - 01/17/21 1505       Activity Barriers & Cardiac Risk Stratification   Activity Barriers Joint Problems   rotator cuff injury, injections in both knees and shoulders   Cardiac Risk Stratification Moderate             6 Minute Walk:  6 Minute Walk     Row Name 01/30/21 1646         6 Minute Walk  Phase Initial     Distance 915 feet     Walk Time 6 minutes     # of Rest Breaks 0     MPH 1.73     METS 3.2     RPE 13     Perceived Dyspnea  1     VO2 Peak 11.3     Symptoms No     Resting HR 62 bpm     Resting BP 138/62     Resting Oxygen Saturation  99 %     Exercise Oxygen Saturation  during 6 min walk 98 %     Max Ex. HR 86 bpm     Max Ex. BP 186/66     2 Minute Post BP 136/64              Oxygen Initial Assessment:   Oxygen Re-Evaluation:   Oxygen Discharge (Final Oxygen Re-Evaluation):   Initial Exercise Prescription:  Initial Exercise Prescription - 01/30/21 1600       Date of Initial Exercise RX and Referring Provider   Date 01/30/21    Referring Provider Patwardhan      Treadmill   MPH 1.7    Grade 1.5    Minutes 15    METs 2.6      Recumbant Bike   Level 1    RPM 60    Minutes 15    METs 3      NuStep   Level 1    SPM 80    Minutes 15     METs 3      REL-XR   Level 1    Speed 50    Minutes 15    METs 3      Track   Laps 38    Minutes 15      Prescription Details   Frequency (times per week) 3    Duration Progress to 30 minutes of continuous aerobic without signs/symptoms of physical distress      Intensity   THRR 40-80% of Max Heartrate 95-139    Ratings of Perceived Exertion 11-13    Perceived Dyspnea 0-4      Resistance Training   Training Prescription Yes    Weight 3lb    Reps 10-15             Perform Capillary Blood Glucose checks as needed.  Exercise Prescription Changes:   Exercise Prescription Changes     Row Name 01/30/21 1600 02/17/21 1500 03/04/21 1400 03/31/21 1100 04/15/21 0700     Response to Exercise   Blood Pressure (Admit) 138/62 128/56 142/62 118/58 128/64   Blood Pressure (Exercise) 186/66 134/64 160/60 -- --   Blood Pressure (Exit) 136/64 132/82 128/56 104/58 112/60   Heart Rate (Admit) 62 bpm 83 bpm 70 bpm 73 bpm 77 bpm   Heart Rate (Exercise) 86 bpm 106 bpm 110 bpm 87 bpm 99 bpm   Heart Rate (Exit) 60 bpm 82 bpm 87 bpm 74 bpm 80 bpm   Oxygen Saturation (Admit) 99 % -- -- -- --   Oxygen Saturation (Exercise) 98 % -- -- -- --   Rating of Perceived Exertion (Exercise) _0 Perceived Dyspnea (Exercise) 1 -- -- -- --   Symptoms none -- none none none   Comments -- third day exercise -- -- --   Duration -- Progress to 30 minutes of  aerobic without signs/symptoms of physical distress Continue with 30 min of aerobic  exercise without signs/symptoms of physical distress. Continue with 30 min of aerobic exercise without signs/symptoms of physical distress. Continue with 30 min of aerobic exercise without signs/symptoms of physical distress.   Intensity -- THRR unchanged THRR unchanged THRR unchanged THRR unchanged     Progression   Progression -- Continue to progress workloads to maintain intensity without signs/symptoms of physical distress. Continue to progress  workloads to maintain intensity without signs/symptoms of physical distress. Continue to progress workloads to maintain intensity without signs/symptoms of physical distress. Continue to progress workloads to maintain intensity without signs/symptoms of physical distress.   Average METs -- 2.55 2.76 2.53 2.86     Resistance Training   Training Prescription -- Yes Yes Yes Yes   Weight -- 3 lb 3 lb 4 lb 4 lb   Reps -- 10-15 10-15 10-15 10-15     Interval Training   Interval Training -- -- No No No     Treadmill   MPH -- 1.3 1.7 1.7 1.7   Grade -- 1 3.5 3.5 2   Minutes -- _0 METs -- 2.17 3.12 3.12 2.77     Recumbant Bike   Level -- -- _1 Minutes -- -- _2 METs -- -- 2.39 2.94 2.95     NuStep   Level -- -- 1 -- --   Minutes -- -- 15 -- --     REL-XR   Level -- -- -- 4 --   Minutes -- -- -- 15 --   METs -- -- -- 1.4 --     Track   Laps -- -- -- 21 --   Minutes -- -- -- 15 --     Oxygen   Maintain Oxygen Saturation -- -- 88% or higher 88% or higher 88% or higher            Exercise Comments:   Exercise Comments     Row Name 02/03/21 1425           Exercise Comments First full day of exercise!  Patient was oriented to gym and equipment including functions, settings, policies, and procedures.  Patient's individual exercise prescription and treatment plan were reviewed.  All starting workloads were established based on the results of the 6 minute walk test done at initial orientation visit.  The plan for exercise progression was also introduced and progression will be customized based on patient's performance and goals.                Exercise Goals and Review:   Exercise Goals     Row Name 01/30/21 1649             Exercise Goals   Increase Physical Activity Yes       Intervention Provide advice, education, support and counseling about physical activity/exercise needs.;Develop an individualized exercise prescription for aerobic  and resistive training based on initial evaluation findings, risk stratification, comorbidities and participant's personal goals.       Expected Outcomes Short Term: Attend rehab on a regular basis to increase amount of physical activity.;Long Term: Add in home exercise to make exercise part of routine and to increase amount of physical activity.;Long Term: Exercising regularly at least 3-5 days a week.       Increase Strength and Stamina Yes       Intervention Provide advice, education, support and counseling about physical activity/exercise needs.;Develop an individualized exercise prescription for aerobic and resistive  training based on initial evaluation findings, risk stratification, comorbidities and participant's personal goals.       Expected Outcomes Short Term: Increase workloads from initial exercise prescription for resistance, speed, and METs.;Short Term: Perform resistance training exercises routinely during rehab and add in resistance training at home;Long Term: Improve cardiorespiratory fitness, muscular endurance and strength as measured by increased METs and functional capacity (6MWT)       Able to understand and use rate of perceived exertion (RPE) scale Yes       Intervention Provide education and explanation on how to use RPE scale       Expected Outcomes Short Term: Able to use RPE daily in rehab to express subjective intensity level;Long Term:  Able to use RPE to guide intensity level when exercising independently       Able to understand and use Dyspnea scale Yes       Intervention Provide education and explanation on how to use Dyspnea scale       Expected Outcomes Short Term: Able to use Dyspnea scale daily in rehab to express subjective sense of shortness of breath during exertion;Long Term: Able to use Dyspnea scale to guide intensity level when exercising independently       Knowledge and understanding of Target Heart Rate Range (THRR) Yes       Intervention Provide education  and explanation of THRR including how the numbers were predicted and where they are located for reference       Expected Outcomes Short Term: Able to state/look up THRR;Short Term: Able to use daily as guideline for intensity in rehab;Long Term: Able to use THRR to govern intensity when exercising independently       Able to check pulse independently Yes       Intervention Provide education and demonstration on how to check pulse in carotid and radial arteries.;Review the importance of being able to check your own pulse for safety during independent exercise       Expected Outcomes Short Term: Able to explain why pulse checking is important during independent exercise;Long Term: Able to check pulse independently and accurately       Understanding of Exercise Prescription Yes       Intervention Provide education, explanation, and written materials on patient's individual exercise prescription       Expected Outcomes Short Term: Able to explain program exercise prescription;Long Term: Able to explain home exercise prescription to exercise independently                Exercise Goals Re-Evaluation :  Exercise Goals Re-Evaluation     [place] Name 02/03/21 1425 02/17/21 1504 03/04/21 1430 03/17/21 1420 03/31/21 1117     Exercise Goal Re-Evaluation   Exercise Goals Review Increase Physical Activity;Able to understand and use rate of perceived exertion (RPE) scale;Knowledge and understanding of Target Heart Rate Range (THRR);Understanding of Exercise Prescription;Increase Strength and Stamina;Able to understand and use Dyspnea scale;Able to check pulse independently Increase Physical Activity;Increase Strength and Stamina Increase Physical Activity;Increase Strength and Stamina;Understanding of Exercise Prescription Increase Physical Activity;Increase Strength and Stamina Increase Physical Activity;Increase Strength and Stamina   Comments Reviewed RPE and dyspnea scales, THR and program prescription with  pt today.  Pt voiced understanding and was given a copy of goals to take home. Tanis is working on building up to prescribed speed on TM.  She reaches THR range.  We will continue to monitor progress. Breana is doing well in rehab. She is now up to 3.5%  grade on the treadmill and 11 watts on the bike.  We will continue to monitor her progress. Leeanna is doing some sit ups outside program sessions.  EP staff will review home exercise Avira has had a hard time tolerating exercise due to MSK limitations. Staff has tried to increase loads for patient, however, patient declined. However, she did increase to 4 lbs for handweights. XR level was up to 4 but she recently has been working at level 1. It would be beneficial for her to maintain a consistent level with each exercise. Will continue to monitor and progress as appropriate and as tolerated.   Expected Outcomes Short: Use RPE daily to regulate intensity. Long: Follow program prescription in THR. Short:  continue to build stamina on TM Long: reach prescribed speed Short: Continue to increase workloads Long: Continue to improve stamina Short: add one day in addition to program sessions Long: maintain exercise on her own Short: Maintain consistent loads and increase gradually Long: Increase overall MET level    Row Name 04/07/21 1405 04/15/21 0726 04/15/21 0727         Exercise Goal Re-Evaluation   Exercise Goals Review Increase Physical Activity;Increase Strength and Stamina;Understanding of Exercise Prescription Increase Physical Activity;Increase Strength and Stamina --     Comments Annalaya is interested in joining the [facility] when she is done with the program. She is able to walk at home for exercise currently. She is doing well in the program. She is wanted increase her energy levels. -- Zuzu attends consistently and reaches her THR range.Staff will encourage her to continue adding incline to TM.     Expected Outcomes Short: add more exercise at home  and join the [facility]. Long: workout at the [facility] independently. -- Short: add incline to TM Long: complete HT program              Discharge Exercise Prescription (Final Exercise Prescription Changes):  Exercise Prescription Changes - 04/15/21 0700       Response to Exercise   Blood Pressure (Admit) 128/64    Blood Pressure (Exit) 112/60    Heart Rate (Admit) 77 bpm    Heart Rate (Exercise) 99 bpm    Heart Rate (Exit) 80 bpm    Rating of Perceived Exertion (Exercise) 13    Symptoms none    Duration Continue with 30 min of aerobic exercise without signs/symptoms of physical distress.    Intensity THRR unchanged      Progression   Progression Continue to progress workloads to maintain intensity without signs/symptoms of physical distress.    Average METs 2.86      Resistance Training   Training Prescription Yes    Weight 4 lb    Reps 10-15      Interval Training   Interval Training No      Treadmill   MPH 1.7    Grade 2    Minutes 15    METs 2.77      Recumbant Bike   Level 2    Minutes 15    METs 2.95      Oxygen   Maintain Oxygen Saturation 88% or higher             Nutrition:  Target Goals: Understanding of nutrition guidelines, daily intake of sodium <1558m, cholesterol <2054m calories 30% from fat and 7% or less from saturated fats, daily to have 5 or more servings of fruits and vegetables.  Education: All About Nutrition: -Group instruction provided by verbal, written  material, interactive activities, discussions, models, and posters to present general guidelines for heart healthy nutrition including fat, fiber, MyPlate, the role of sodium in heart healthy nutrition, utilization of the nutrition label, and utilization of this knowledge for meal planning. Follow up email sent as well. Written material given at graduation. Flowsheet Row Cardiac Rehab from 04/09/2021 in [facility] Cardiac and Pulmonary Rehab  Education need identified 01/30/21        Biometrics:  Pre Biometrics - 01/30/21 1650       Pre Biometrics   Height 5' 11" (1.803 m)    Weight 184 lb 1.6 oz (83.5 kg)    BMI (Calculated) 25.69    Single Leg Stand 8.9 seconds              Nutrition Therapy Plan and Nutrition Goals:  Nutrition Therapy & Goals - 03/24/21 1500       Nutrition Therapy   RD appointment deferred Yes   pt would not like to meet with dietitian at this time, will continue to follow up     Personal Nutrition Goals   Comments pt would not like to meet with dietitian at this time, will continue to follow up             Nutrition Assessments:  MEDIFICTS Score Key: ?70 Need to make dietary changes  40-70 Heart Healthy Diet ? 40 Therapeutic Level Cholesterol Diet  Flowsheet Row Cardiac Rehab from 01/30/2021 in [facility] Cardiac and Pulmonary Rehab  Picture Your Plate Total Score on Admission 51      Picture Your Plate Scores: <02 Unhealthy dietary pattern with much room for improvement. 41-50 Dietary pattern unlikely to meet recommendations for good health and room for improvement. 51-60 More healthful dietary pattern, with some room for improvement.  >60 Healthy dietary pattern, although there may be some specific behaviors that could be improved.    Nutrition Goals Re-Evaluation:  Nutrition Goals Re-Evaluation     [place] Name 03/17/21 1421 04/07/21 1411           Goals   Current Weight -- 182 lb (82.6 kg)      Comment Melvin has not met with RD yet Anorah does not want to meet with the dietician. She has no cencerns with her nutritional health.      Expected Outcome ST: schedule and meet with RD Short: inform staff of nutritions changes. Long: make healthy eating choices.               Nutrition Goals Discharge (Final Nutrition Goals Re-Evaluation):  Nutrition Goals Re-Evaluation - 04/07/21 1411       Goals   Current Weight 182 lb (82.6 kg)    Comment Roizy does not want to meet with the dietician. She has no  cencerns with her nutritional health.    Expected Outcome Short: inform staff of nutritions changes. Long: make healthy eating choices.             Psychosocial: Target Goals: Acknowledge presence or absence of significant depression and/or stress, maximize coping skills, provide positive support system. Participant is able to verbalize types and ability to use techniques and skills needed for reducing stress and depression.   Education: Stress, Anxiety, and Depression - Group verbal and visual presentation to define topics covered.  Reviews how body is impacted by stress, anxiety, and depression.  Also discusses healthy ways to reduce stress and to treat/manage anxiety and depression.  Written material given at graduation. Flowsheet Row Cardiac Rehab from 04/09/2021  in [facility] Cardiac and Pulmonary Rehab  Date 04/09/21  Educator AS  Instruction Review Code 1- Verbalizes Understanding       Education: Sleep Hygiene -Provides group verbal and written instruction about how sleep can affect your health.  Define sleep hygiene, discuss sleep cycles and impact of sleep habits. Review good sleep hygiene tips.    Initial Review & Psychosocial Screening:  Initial Psych Review & Screening - 01/17/21 1509       Initial Review   Current issues with Current Stress Concerns    Source of Stress Concerns Occupation      [place]? Yes   daughter     Barriers   Psychosocial barriers to participate in program There are no identifiable barriers or psychosocial needs.;The patient should benefit from training in stress management and relaxation.      Screening Interventions   Interventions Encouraged to exercise;Provide feedback about the scores to participant;To provide support and resources with identified psychosocial needs    Expected Outcomes Short Term goal: Utilizing psychosocial counselor, staff and physician to assist with identification of specific Stressors or  current issues interfering with healing process. Setting desired goal for each stressor or current issue identified.;Long Term Goal: Stressors or current issues are controlled or eliminated.;Short Term goal: Identification and review with participant of any Quality of Life or Depression concerns found by scoring the questionnaire.;Long Term goal: The participant improves quality of Life and PHQ9 Scores as seen by post scores and/or verbalization of changes             Quality of Life Scores:   Quality of Life - 01/30/21 1653       Quality of Life   Select Quality of Life      Quality of Life Scores   Health/Function Pre 17.92 %    Socioeconomic Pre 15.38 %    Psych/Spiritual Pre 19.71 %    Family Pre 22.6 %    GLOBAL Pre 18.39 %            Scores of 19 and below usually indicate a poorer quality of life in these areas.  A difference of  2-3 points is a clinically meaningful difference.  A difference of 2-3 points in the total score of the Quality of Life Index has been associated with significant improvement in overall quality of life, self-image, physical symptoms, and general health in studies assessing change in quality of life.  PHQ-9: Recent Review Flowsheet Data     Depression screen [facility] 2/9 03/17/2021 03/03/2021 01/30/2021   Decreased Interest 0 2 1   Down, Depressed, Hopeless 0 0 1   PHQ - 2 Score 0 2 2   Altered sleeping _0 Tired, decreased energy _1 Change in appetite _2 Feeling bad or failure about yourself  0 0 0   Trouble concentrating 0 1 -   Moving slowly or fidgety/restless 0 0 2   Suicidal thoughts 0 0 0   PHQ-9 Score _3 Difficult doing work/chores Somewhat difficult Somewhat difficult Somewhat difficult      Interpretation of Total Score  Total Score Depression Severity:  1-4 = Minimal depression, 5-9 = Mild depression, 10-14 = Moderate depression, 15-19 = Moderately severe depression, 20-27 = Severe depression   Psychosocial  Evaluation and Intervention:  Psychosocial Evaluation - 03/17/21 1417       Psychosocial Evaluation & Interventions  Comments Review PHQ 9 with patient.  Score improved to a 3.  She feels stopping ozempic helped her feel better.  She has gone back to work part time.  She feels good with the part time hours.  She will let staff know if she needs info on vocational rehab    Expected Outcomes Short:attend rehab for the exercise and stress education Long: maintain good self care habits             Psychosocial Re-Evaluation:  Psychosocial Re-Evaluation     [place] Name 03/03/21 1437 03/17/21 1413 04/07/21 1408         Psychosocial Re-Evaluation   Current issues with Current Stress Concerns Current Stress Concerns None Identified     Comments Reviewed patient health questionnaire (PHQ-9) with patient for follow up. Previously, patients score indicated signs/symptoms of depression.  Reviewed to see if patient is improving symptom wise while in program.  Score declined and patient states that it is because she has not been able to sleep well and she does not feel good after she eats a bit of food. -- Patient reports no issues with their current mental states, sleep, stress, depression or anxiety. Will follow up with patient in a few weeks for any changes.     Expected Outcomes Short: Continue to work toward an improvement in [place] scores by attending [facility] regularly. Long: Continue to improve stress and depression coping skills by talking with staff and attendind [facility] regularly and work toward a positive mental state. -- Short: Continue to exercise regularly to support mental health and notify staff of any changes. Long: maintain mental health and well being through teaching of rehab or prescribed medications independently.     Interventions Encouraged to attend Cardiac Rehabilitation for the exercise -- Encouraged to attend Cardiac Rehabilitation for the exercise     Continue Psychosocial  Services  Follow up required by staff -- Follow up required by staff              Psychosocial Discharge (Final Psychosocial Re-Evaluation):  Psychosocial Re-Evaluation - 04/07/21 1408       Psychosocial Re-Evaluation   Current issues with None Identified    Comments Patient reports no issues with their current mental states, sleep, stress, depression or anxiety. Will follow up with patient in a few weeks for any changes.    Expected Outcomes Short: Continue to exercise regularly to support mental health and notify staff of any changes. Long: maintain mental health and well being through teaching of rehab or prescribed medications independently.    Interventions Encouraged to attend Cardiac Rehabilitation for the exercise    Continue Psychosocial Services  Follow up required by staff             Vocational Rehabilitation: Provide vocational rehab assistance to qualifying candidates.   Vocational Rehab Evaluation & Intervention:  Vocational Rehab - 01/17/21 1508       Initial Vocational Rehab Evaluation & Intervention   Assessment shows need for Vocational Rehabilitation No             Education: Education Goals: Education classes will be provided on a variety of topics geared toward better understanding of heart health and risk factor modification. Participant will state understanding/return demonstration of topics presented as noted by education test scores.  Learning Barriers/Preferences:  Learning Barriers/Preferences - 01/17/21 1508       Learning Barriers/Preferences   Learning Barriers None    Learning Preferences None  General Cardiac Education Topics:  AED/CPR: - Group verbal and written instruction with the use of models to demonstrate the basic use of the AED with the basic ABC's of resuscitation.   Anatomy and Cardiac Procedures: - Group verbal and visual presentation and models provide information about basic cardiac anatomy and  function. Reviews the testing methods done to diagnose heart disease and the outcomes of the test results. Describes the treatment choices: Medical Management, Angioplasty, or Coronary Bypass Surgery for treating various heart conditions including Myocardial Infarction, Angina, Valve Disease, and Cardiac Arrhythmias.  Written material given at graduation. Flowsheet Row Cardiac Rehab from 04/09/2021 in [facility] Cardiac and Pulmonary Rehab  Date 02/26/21  Educator sb  Instruction Review Code 1- Verbalizes Understanding       Medication Safety: - Group verbal and visual instruction to review commonly prescribed medications for heart and lung disease. Reviews the medication, class of the drug, and side effects. Includes the steps to properly store meds and maintain the prescription regimen.  Written material given at graduation.   Intimacy: - Group verbal instruction through game format to discuss how heart and lung disease can affect sexual intimacy. Written material given at graduation..   Know Your Numbers and Heart Failure: - Group verbal and visual instruction to discuss disease risk factors for cardiac and pulmonary disease and treatment options.  Reviews associated critical values for Overweight/Obesity, Hypertension, Cholesterol, and Diabetes.  Discusses basics of heart failure: signs/symptoms and treatments.  Introduces Heart Failure Zone chart for action plan for heart failure.  Written material given at graduation. Flowsheet Row Cardiac Rehab from 04/09/2021 in [facility] Cardiac and Pulmonary Rehab  Date 03/26/21  Educator [facility]  Instruction Review Code 1- Verbalizes Understanding       Infection Prevention: - Provides verbal and written material to individual with discussion of infection control including proper hand washing and proper equipment cleaning during exercise session. Flowsheet Row Cardiac Rehab from 04/09/2021 in [facility] Cardiac and Pulmonary Rehab  Date 01/30/21  Educator AS   Instruction Review Code 1- Verbalizes Understanding       Falls Prevention: - Provides verbal and written material to individual with discussion of falls prevention and safety. Flowsheet Row Cardiac Rehab from 04/09/2021 in [facility] Cardiac and Pulmonary Rehab  Date 01/30/21  Educator AS  Instruction Review Code 1- Verbalizes Understanding       Other: -Provides group and verbal instruction on various topics (see comments)   Knowledge Questionnaire Score:  Knowledge Questionnaire Score - 01/30/21 1732       Knowledge Questionnaire Score   Pre Score 18/26             Core Components/Risk Factors/Patient Goals at Admission:  Personal Goals and Risk Factors at Admission - 01/30/21 1652       Core Components/Risk Factors/Patient Goals on Admission   Tobacco Cessation Yes    Number of packs per day Quit August 2022    Intervention Offer self-teaching materials, assist with locating and accessing local/national Quit Smoking programs, and support quit date choice.;Assist the participant in steps to quit. Provide individualized education and counseling about committing to Tobacco Cessation, relapse prevention, and pharmacological support that can be provided by physician.    Expected Outcomes Short Term: Will demonstrate readiness to quit, by selecting a quit date.;Short Term: Will quit all tobacco product use, adhering to prevention of relapse plan.;Long Term: Complete abstinence from all tobacco products for at least 12 months from quit date.    Diabetes Yes  Intervention Provide education about signs/symptoms and action to take for hypo/hyperglycemia.;Provide education about proper nutrition, including hydration, and aerobic/resistive exercise prescription along with prescribed medications to achieve blood glucose in normal ranges: Fasting glucose 65-99 mg/dL    Expected Outcomes Short Term: Participant verbalizes understanding of the signs/symptoms and immediate care of  hyper/hypoglycemia, proper foot care and importance of medication, aerobic/resistive exercise and nutrition plan for blood glucose control.;Long Term: Attainment of HbA1C < 7%.    Hypertension Yes    Intervention Provide education on lifestyle modifcations including regular physical activity/exercise, weight management, moderate sodium restriction and increased consumption of fresh fruit, vegetables, and low fat dairy, alcohol moderation, and smoking cessation.;Monitor prescription use compliance.    Expected Outcomes Short Term: Continued assessment and intervention until BP is < 140/30m HG in hypertensive participants. < 130/860mHG in hypertensive participants with diabetes, heart failure or chronic kidney disease.;Long Term: Maintenance of blood pressure at goal levels.    Lipids Yes    Intervention Provide education and support for participant on nutrition & aerobic/resistive exercise along with prescribed medications to achieve LDL <7044mHDL >43m16m  Expected Outcomes Short Term: Participant states understanding of desired cholesterol values and is compliant with medications prescribed. Participant is following exercise prescription and nutrition guidelines.;Long Term: Cholesterol controlled with medications as prescribed, with individualized exercise RX and with personalized nutrition plan. Value goals: LDL < 70mg30mL > 40 mg.             Education:Diabetes - Individual verbal and written instruction to review signs/symptoms of diabetes, desired ranges of glucose level fasting, after meals and with exercise. Acknowledge that pre and post exercise glucose checks will be done for 3 sessions at entry of program. [place] [facility]  Date 01/17/21  Educator [facility] Code 1- Verbalizes Understanding       Core Components/Risk Factors/Patient Goals Review:   Goals and Risk Factor Review     Row Name 03/17/21 1407  04/07/21 1409           Core Components/Risk Factors/Patient Goals Review   Personal Goals Review Tobacco Cessation;Diabetes;Hypertension Tobacco Cessation;Hypertension      Review TeresTaleneins tobacco free! She still has cravings.  She says she can go on until her cravings pass.  She does check her BG in the morning - usually around 106.  She had a dizzy spell and didnt feel good at work last week.  She didnt check BG or BP as she was at work.  This was right after she had been sick.  She hasnt had any more episodes.  We reviewed the importance of keeping an eye on BP and BG.  She reports taking all medications as directed. TeresMycahes she has not touched any tobacco products sinc eshe quit smoking in August. Her blood pressure cuff at home reads high. Informed her to bring it in and we can check with our readings.      Expected Outcomes Short:  continue to monitor risk factors and maintain tobacco cessation Long: manage risk factors long term Short: bring in blood pressure cuff. Long: maintain adequate blood pressure readings independently.               Core Components/Risk Factors/Patient Goals at Discharge (Final Review):   Goals and Risk Factor Review - 04/07/21 1409       Core Components/Risk Factors/Patient Goals Review   Personal Goals Review Tobacco Cessation;Hypertension  Review Sheva states she has not touched any tobacco products sinc eshe quit smoking in August. Her blood pressure cuff at home reads high. Informed her to bring it in and we can check with our readings.    Expected Outcomes Short: bring in blood pressure cuff. Long: maintain adequate blood pressure readings independently.             ITP Comments:  ITP Comments     Row Name 01/17/21 1517 01/30/21 1733 02/03/21 1425 02/26/21 0706 03/26/21 0653   ITP Comments Initial telephone orientation completed. Diagnosis can be found in [facility] 8/11. EP orientation scheduled for Thursday 10/6 at 3pm. Completed  6MWT and gym orientation. Initial ITP created and sent for review to Dr. Emily Filbert, Medical Director. First full day of exercise!  Patient was oriented to gym and equipment including functions, settings, policies, and procedures.  Patient's individual exercise prescription and treatment plan were reviewed.  All starting workloads were established based on the results of the 6 minute walk test done at initial orientation visit.  The plan for exercise progression was also introduced and progression will be customized based on patient's performance and goals. 30 Day review completed. Medical Director ITP review done, changes made as directed, and signed approval by Medical Director. 30 Day review completed. Medical Director ITP review done, changes made as directed, and signed approval by Medical Director.    [place] Name 04/23/21 0709           ITP Comments 30 Day review completed. Medical Director ITP review done, changes made as directed, and signed approval by Medical Director.                Comments:

## 2021-04-29 ENCOUNTER — Encounter: Payer: Self-pay | Admitting: *Deleted

## 2021-04-29 DIAGNOSIS — I214 Non-ST elevation (NSTEMI) myocardial infarction: Secondary | ICD-10-CM

## 2021-04-29 DIAGNOSIS — Z955 Presence of coronary angioplasty implant and graft: Secondary | ICD-10-CM

## 2021-04-30 ENCOUNTER — Other Ambulatory Visit: Payer: Self-pay

## 2021-04-30 ENCOUNTER — Encounter: Payer: BC Managed Care – PPO | Attending: Cardiology

## 2021-04-30 DIAGNOSIS — I214 Non-ST elevation (NSTEMI) myocardial infarction: Secondary | ICD-10-CM

## 2021-04-30 DIAGNOSIS — Z955 Presence of coronary angioplasty implant and graft: Secondary | ICD-10-CM | POA: Diagnosis present

## 2021-04-30 DIAGNOSIS — Z48812 Encounter for surgical aftercare following surgery on the circulatory system: Secondary | ICD-10-CM | POA: Diagnosis not present

## 2021-04-30 DIAGNOSIS — I252 Old myocardial infarction: Secondary | ICD-10-CM | POA: Diagnosis not present

## 2021-04-30 NOTE — Progress Notes (Signed)
Daily Session Note  Patient Details  Name: VALERYE KOBUS MRN: [id number] Date of Birth: [birth year] Referring Provider:   Flowsheet Row Cardiac Rehab from 01/30/2021 in [facility] Cardiac and Pulmonary Rehab  Referring Provider Patwardhan       Encounter Date: 04/30/2021  Check In:  Session Check In - 04/30/21 1400       Check-In   Supervising physician immediately available to respond to emergencies See telemetry face sheet for immediately available ER MD    Location [facility]-Cardiac & Pulmonary Rehab    Staff Present Birdie Sons, MPA, RN;Joseph Garnette Czech [place], MS, ASCM CEP, Exercise Physiologist    Virtual Visit No    Medication changes reported     No    Fall or balance concerns reported    No    Tobacco Cessation No Change    Warm-up and Cool-down Performed on first and last piece of equipment    Resistance Training Performed Yes    VAD Patient? No    PAD/SET Patient? No      Pain Assessment   Currently in Pain? No/denies                Social History   Tobacco Use  Smoking Status Former   Packs/day: 0.50   Years: 15.00   Pack years: 7.50   Types: Cigarettes   Quit date: 11/2020   Years since quitting: 0.4  Smokeless Tobacco Never    Goals Met:  Independence with exercise equipment Exercise tolerated well No report of concerns or symptoms today Strength training completed today  Goals Unmet:  Not Applicable  Comments: Pt able to follow exercise prescription today without complaint.  Will continue to monitor for progression.    Dr. Emily Filbert is Medical Director for [place].  Dr. Ottie Glazier is Medical Director for [facility] Pulmonary Rehabilitation.

## 2021-05-01 ENCOUNTER — Encounter: Payer: BC Managed Care – PPO | Admitting: *Deleted

## 2021-05-01 ENCOUNTER — Other Ambulatory Visit: Payer: Self-pay

## 2021-05-01 DIAGNOSIS — Z48812 Encounter for surgical aftercare following surgery on the circulatory system: Secondary | ICD-10-CM | POA: Diagnosis not present

## 2021-05-01 DIAGNOSIS — Z955 Presence of coronary angioplasty implant and graft: Secondary | ICD-10-CM

## 2021-05-01 DIAGNOSIS — I214 Non-ST elevation (NSTEMI) myocardial infarction: Secondary | ICD-10-CM

## 2021-05-01 NOTE — Progress Notes (Signed)
Daily Session Note  Patient Details  Name: Monica Bell MRN: [id number] Date of Birth: 1960/10/27 Referring Provider:   Flowsheet Row Cardiac Rehab from 01/30/2021 in [facility] Cardiac and Pulmonary Rehab  Referring Provider Patwardhan       Encounter Date: 05/01/2021  Check In:  Session Check In - 05/01/21 1351       Check-In   Supervising physician immediately available to respond to emergencies See telemetry face sheet for immediately available ER MD    Location [facility]-Cardiac & Pulmonary Rehab    Staff Present Renita Papa, RN BSN;Joseph [place], RCP,RRT,BSRT;Jessica [place], [place], RCEP, CCRP, CCET    Virtual Visit No    Medication changes reported     No    Fall or balance concerns reported    No    Warm-up and Cool-down Performed on first and last piece of equipment    Resistance Training Performed Yes    VAD Patient? No    PAD/SET Patient? No      Pain Assessment   Currently in Pain? No/denies                Social History   Tobacco Use  Smoking Status Former   Packs/day: 0.50   Years: 15.00   Pack years: 7.50   Types: Cigarettes   Quit date: 11/2020   Years since quitting: 0.4  Smokeless Tobacco Never    Goals Met:  Independence with exercise equipment Exercise tolerated well No report of concerns or symptoms today Strength training completed today  Goals Unmet:  Not Applicable  Comments: Pt able to follow exercise prescription today without complaint.  Will continue to monitor for progression.    Dr. Emily Filbert is Medical Director for [place].  Dr. Ottie Glazier is Medical Director for [facility] Pulmonary Rehabilitation.

## 2021-05-05 ENCOUNTER — Other Ambulatory Visit: Payer: Self-pay

## 2021-05-05 DIAGNOSIS — I214 Non-ST elevation (NSTEMI) myocardial infarction: Secondary | ICD-10-CM

## 2021-05-05 DIAGNOSIS — Z48812 Encounter for surgical aftercare following surgery on the circulatory system: Secondary | ICD-10-CM | POA: Diagnosis not present

## 2021-05-05 DIAGNOSIS — Z955 Presence of coronary angioplasty implant and graft: Secondary | ICD-10-CM

## 2021-05-05 NOTE — Progress Notes (Signed)
Daily Session Note  Patient Details  Name: Monica Bell MRN: [id number] Date of Birth: 1960-08-25 Referring Provider:   Flowsheet Row Cardiac Rehab from 01/30/2021 in [facility] Cardiac and Pulmonary Rehab  Referring Provider Patwardhan       Encounter Date: 05/05/2021  Check In:  Session Check In - 05/05/21 1405       Check-In   Supervising physician immediately available to respond to emergencies See telemetry face sheet for immediately available ER MD    Location [facility]-Cardiac & Pulmonary Rehab    Staff Present Birdie Sons, MPA, RN;Joseph Garnette Czech [place], MS, ASCM CEP, Exercise Physiologist    Virtual Visit No    Medication changes reported     No    Fall or balance concerns reported    No    Tobacco Cessation No Change    Warm-up and Cool-down Performed on first and last piece of equipment    Resistance Training Performed Yes    VAD Patient? No    PAD/SET Patient? No      Pain Assessment   Currently in Pain? No/denies                Social History   Tobacco Use  Smoking Status Former   Packs/day: 0.50   Years: 15.00   Pack years: 7.50   Types: Cigarettes   Quit date: 11/2020   Years since quitting: 0.4  Smokeless Tobacco Never    Goals Met:  Independence with exercise equipment Exercise tolerated well No report of concerns or symptoms today Strength training completed today  Goals Unmet:  Not Applicable  Comments: Pt able to follow exercise prescription today without complaint.  Will continue to monitor for progression.    Dr. Emily Filbert is Medical Director for [place].  Dr. Ottie Glazier is Medical Director for [facility] Pulmonary Rehabilitation.

## 2021-05-08 ENCOUNTER — Other Ambulatory Visit: Payer: Self-pay

## 2021-05-08 ENCOUNTER — Encounter: Payer: BC Managed Care – PPO | Admitting: *Deleted

## 2021-05-08 DIAGNOSIS — Z48812 Encounter for surgical aftercare following surgery on the circulatory system: Secondary | ICD-10-CM | POA: Diagnosis not present

## 2021-05-08 DIAGNOSIS — Z955 Presence of coronary angioplasty implant and graft: Secondary | ICD-10-CM

## 2021-05-08 DIAGNOSIS — I214 Non-ST elevation (NSTEMI) myocardial infarction: Secondary | ICD-10-CM

## 2021-05-08 NOTE — Progress Notes (Signed)
Daily Session Note ° °Patient Details  °Name: Monica Bell °MRN: [id number] °Date of Birth: 08/30/1960 °Referring Provider:   °Flowsheet Row Cardiac Rehab from 01/30/2021 in [facility] Cardiac and Pulmonary Rehab  °Referring Provider Patwardhan  ° °  ° ° °Encounter Date: 05/08/2021 ° °Check In: ° Session Check In - 05/08/21 1406   ° °  ° Check-In  ° Supervising physician immediately available to respond to emergencies See telemetry face sheet for immediately available ER MD   ° Location [facility]-Cardiac & Pulmonary Rehab   ° Staff Present Meredith Craven, RN BSN;Joseph [place], RCP,RRT,BSRT;Jessica Hawkins, MA, RCEP, CCRP, CCET   ° Virtual Visit No   ° Medication changes reported     No   ° Fall or balance concerns reported    No   ° Warm-up and Cool-down Performed on first and last piece of equipment   ° Resistance Training Performed Yes   ° VAD Patient? No   ° PAD/SET Patient? No   °  ° Pain Assessment  ° Currently in Pain? No/denies   ° °  °  ° °  ° ° ° ° ° °Social History  ° °Tobacco Use  °Smoking Status Former  ° Packs/day: 0.50  ° Years: 15.00  ° Pack years: 7.50  ° Types: Cigarettes  ° Quit date: 11/2020  ° Years since quitting: 0.4  °Smokeless Tobacco Never  ° ° °Goals Met:  °Independence with exercise equipment °Exercise tolerated well °No report of concerns or symptoms today °Strength training completed today ° °Goals Unmet:  °Not Applicable ° °Comments: Pt able to follow exercise prescription today without complaint.  Will continue to monitor for progression. ° ° ° °Dr. Mark Miller is Medical Director for [facility] Cardiac [facility].  °Dr. Fuad Aleskerov is Medical Director for [facility] Pulmonary Rehabilitation. °

## 2021-05-14 ENCOUNTER — Other Ambulatory Visit: Payer: Self-pay

## 2021-05-14 DIAGNOSIS — Z955 Presence of coronary angioplasty implant and graft: Secondary | ICD-10-CM

## 2021-05-14 DIAGNOSIS — I214 Non-ST elevation (NSTEMI) myocardial infarction: Secondary | ICD-10-CM

## 2021-05-14 DIAGNOSIS — Z48812 Encounter for surgical aftercare following surgery on the circulatory system: Secondary | ICD-10-CM | POA: Diagnosis not present

## 2021-05-14 NOTE — Progress Notes (Signed)
Daily Session Note  Patient Details  Name: Monica Bell MRN: [id number] Date of Birth: 12-Dec-1960 Referring Provider:   Flowsheet Row Cardiac Rehab from 01/30/2021 in [facility] Cardiac and Pulmonary Rehab  Referring Provider Patwardhan       Encounter Date: 05/14/2021  Check In:  Session Check In - 05/14/21 1410       Check-In   Supervising physician immediately available to respond to emergencies See telemetry face sheet for immediately available ER MD    Location [facility]-Cardiac & Pulmonary Rehab    Staff Present Birdie Sons, MPA, Nino Glow, MS, ASCM CEP, Exercise Physiologist;Joseph Tessie Fass, [place]    Virtual Visit No    Medication changes reported     No    Fall or balance concerns reported    No    Tobacco Cessation No Change    Warm-up and Cool-down Performed on first and last piece of equipment    Resistance Training Performed Yes    VAD Patient? No    PAD/SET Patient? No      Pain Assessment   Currently in Pain? No/denies                Social History   Tobacco Use  Smoking Status Former   Packs/day: 0.50   Years: 15.00   Pack years: 7.50   Types: Cigarettes   Quit date: 11/2020   Years since quitting: 0.4  Smokeless Tobacco Never    Goals Met:  Independence with exercise equipment Exercise tolerated well No report of concerns or symptoms today Strength training completed today  Goals Unmet:  Not Applicable  Comments: Pt able to follow exercise prescription today without complaint.  Will continue to monitor for progression.    Dr. Emily Filbert is Medical Director for [place].  Dr. Ottie Glazier is Medical Director for [facility] Pulmonary Rehabilitation.

## 2021-05-15 ENCOUNTER — Other Ambulatory Visit: Payer: Self-pay

## 2021-05-15 ENCOUNTER — Encounter: Payer: BC Managed Care – PPO | Admitting: *Deleted

## 2021-05-15 DIAGNOSIS — Z955 Presence of coronary angioplasty implant and graft: Secondary | ICD-10-CM

## 2021-05-15 DIAGNOSIS — I214 Non-ST elevation (NSTEMI) myocardial infarction: Secondary | ICD-10-CM

## 2021-05-15 DIAGNOSIS — Z48812 Encounter for surgical aftercare following surgery on the circulatory system: Secondary | ICD-10-CM | POA: Diagnosis not present

## 2021-05-15 NOTE — Progress Notes (Signed)
Daily Session Note  Patient Details  Name: Monica Bell MRN: [id number] Date of Birth: 01/23/1961 Referring Provider:   Flowsheet Row Cardiac Rehab from 01/30/2021 in [facility] Cardiac and Pulmonary Rehab  Referring Provider Patwardhan       Encounter Date: 05/15/2021  Check In:  Session Check In - 05/15/21 1357       Check-In   Supervising physician immediately available to respond to emergencies See telemetry face sheet for immediately available ER MD    Location [facility]-Cardiac & Pulmonary Rehab    Staff Present Renita Papa, RN BSN;Joseph [place], RCP,RRT,BSRT;Jessica [place], [place], RCEP, CCRP, CCET    Virtual Visit No    Medication changes reported     No    Fall or balance concerns reported    No    Warm-up and Cool-down Performed on first and last piece of equipment    Resistance Training Performed Yes    VAD Patient? No    PAD/SET Patient? No      Pain Assessment   Currently in Pain? No/denies                Social History   Tobacco Use  Smoking Status Former   Packs/day: 0.50   Years: 15.00   Pack years: 7.50   Types: Cigarettes   Quit date: 11/2020   Years since quitting: 0.4  Smokeless Tobacco Never    Goals Met:  Independence with exercise equipment Exercise tolerated well No report of concerns or symptoms today Strength training completed today  Goals Unmet:  Not Applicable  Comments: Pt able to follow exercise prescription today without complaint.  Will continue to monitor for progression.    Dr. Emily Filbert is Medical Director for [place].  Dr. Ottie Glazier is Medical Director for [facility] Pulmonary Rehabilitation.

## 2021-05-19 ENCOUNTER — Other Ambulatory Visit: Payer: Self-pay

## 2021-05-19 DIAGNOSIS — Z955 Presence of coronary angioplasty implant and graft: Secondary | ICD-10-CM

## 2021-05-19 DIAGNOSIS — Z48812 Encounter for surgical aftercare following surgery on the circulatory system: Secondary | ICD-10-CM | POA: Diagnosis not present

## 2021-05-19 DIAGNOSIS — I214 Non-ST elevation (NSTEMI) myocardial infarction: Secondary | ICD-10-CM

## 2021-05-19 NOTE — Progress Notes (Signed)
Daily Session Note  Patient Details  Name: Monica Bell MRN: [id number] Date of Birth: 25-Oct-1960 Referring Provider:   Flowsheet Row Cardiac Rehab from 01/30/2021 in [facility] Cardiac and Pulmonary Rehab  Referring Provider Patwardhan       Encounter Date: 05/19/2021  Check In:  Session Check In - 05/19/21 1411       Check-In   Supervising physician immediately available to respond to emergencies See telemetry face sheet for immediately available ER MD    Location [facility]-Cardiac & Pulmonary Rehab    Staff Present Birdie Sons, MPA, RN;Joseph Garnette Czech [place], MS, ASCM CEP, Exercise Physiologist    Virtual Visit No    Medication changes reported     No    Fall or balance concerns reported    No    Tobacco Cessation No Change    Warm-up and Cool-down Performed on first and last piece of equipment    Resistance Training Performed Yes    VAD Patient? No    PAD/SET Patient? No      Pain Assessment   Currently in Pain? No/denies                Social History   Tobacco Use  Smoking Status Former   Packs/day: 0.50   Years: 15.00   Pack years: 7.50   Types: Cigarettes   Quit date: 11/2020   Years since quitting: 0.4  Smokeless Tobacco Never    Goals Met:  Independence with exercise equipment Exercise tolerated well No report of concerns or symptoms today Strength training completed today  Goals Unmet:  Not Applicable  Comments: Pt able to follow exercise prescription today without complaint.  Will continue to monitor for progression.    Dr. Emily Filbert is Medical Director for [place].  Dr. Ottie Glazier is Medical Director for [facility] Pulmonary Rehabilitation.

## 2021-05-21 ENCOUNTER — Encounter: Payer: Self-pay | Admitting: *Deleted

## 2021-05-21 DIAGNOSIS — Z955 Presence of coronary angioplasty implant and graft: Secondary | ICD-10-CM

## 2021-05-21 DIAGNOSIS — I214 Non-ST elevation (NSTEMI) myocardial infarction: Secondary | ICD-10-CM

## 2021-05-21 NOTE — Progress Notes (Signed)
Cardiac Individual Treatment Plan  Patient Details  Name: Monica Bell MRN: [id number] Date of Birth: 10-Aug-1960 Referring Provider:   Flowsheet Row Cardiac Rehab from 01/30/2021 in [facility] Cardiac and Pulmonary Rehab  Referring Provider Patwardhan       Initial Encounter Date:  Flowsheet Row Cardiac Rehab from 01/30/2021 in [facility] Cardiac and Pulmonary Rehab  Date 01/30/21       Visit Diagnosis: NSTEMI (non-ST elevated myocardial infarction) [facility])  Status post coronary artery stent placement  Patient's Home Medications on Admission:  Current Outpatient Medications:    amLODipine (NORVASC) 5 MG tablet, Take 1 tablet (5 mg total) by mouth daily., Disp: 30 tablet, Rfl: 3   BRILINTA 90 MG TABS tablet, TAKE 1 TABLET BY MOUTH TWICE DAILY, Disp: 60 tablet, Rfl: 11   Calcium Carbonate-Vitamin D (CALTRATE 600+D PO), Take 1 tablet by mouth daily., Disp: , Rfl:    cetirizine (ZYRTEC) 10 MG tablet, Take 10 mg by mouth daily., Disp: , Rfl:    cholecalciferol (VITAMIN D3) 25 MCG (1000 UNIT) tablet, Take 1,000 Units by mouth daily., Disp: , Rfl:    GNP ASPIRIN LOW DOSE 81 MG EC tablet, TAKE 1 TABLET BY MOUTH ONCE DAILY. SWALLOW WHOLE., Disp: 90 tablet, Rfl: 1   Investigational - Study Medication, EMPACT-MI Study  Empagliflozin /or Placebo, Disp: , Rfl:    isosorbide-hydrALAZINE (BIDIL) 20-37.5 MG tablet, Take 1 tablet by mouth 3 (three) times daily., Disp: 90 tablet, Rfl: 3   ivabradine (CORLANOR) 5 MG TABS tablet, Take 1 tablet (5 mg total) by mouth 2 (two) times daily with a meal., Disp: 60 tablet, Rfl: 2   levothyroxine (SYNTHROID) 137 MCG tablet, Take 137 mcg by mouth daily., Disp: , Rfl:    losartan (COZAAR) 50 MG tablet, Take 1 tablet by mouth daily., Disp: , Rfl:    magnesium oxide (MAG-OX) 400 MG tablet, Take 400 mg by mouth daily., Disp: , Rfl:    metFORMIN (GLUCOPHAGE) 1000 MG tablet, Take 1,000 mg by mouth 2 (two) times daily with a meal., Disp: , Rfl:    metoprolol succinate  (TOPROL-XL) 50 MG 24 hr tablet, Take 1 tablet (50 mg total) by mouth daily. Take with or immediately following a meal., Disp: 30 tablet, Rfl: 0   Multiple Minerals-Vitamins (CAL-MAG-ZINC-D PO), Take by mouth., Disp: , Rfl:    nitroGLYCERIN (NITROSTAT) 0.4 MG SL tablet, Place 1 tablet (0.4 mg total) under the tongue every 5 (five) minutes as needed for chest pain., Disp: 30 tablet, Rfl: 3   omeprazole (PRILOSEC) 20 MG capsule, Take 20 mg by mouth daily., Disp: , Rfl:    ranolazine (RANEXA) 500 MG 12 hr tablet, Take 1 tablet (500 mg total) by mouth 2 (two) times daily., Disp: 60 tablet, Rfl: 3   rosuvastatin (CRESTOR) 40 MG tablet, Take 1 tablet (40 mg total) by mouth daily., Disp: 30 tablet, Rfl: 0   TRESIBA FLEXTOUCH 200 UNIT/ML FlexTouch Pen, Inject 45 Units into the skin daily., Disp: , Rfl:    vitamin B-12 (CYANOCOBALAMIN) 100 MCG tablet, Take 100 mcg by mouth daily., Disp: , Rfl:   Past Medical History: Past Medical History:  Diagnosis Date   Abnormal mammogram    Allergic rhinitis due to pollen 08/01/2019   Arthritis 01/06/2019   osteoarthritis of both knees   BMI 28.0-28.9,adult 08/01/2019   Cystocele, unspecified (CODE)    Diabetes mellitus    GERD (gastroesophageal reflux disease)    Heart murmur    Hyperlipemia, mixed 12/14/2018   Hypertension  Hypothyroidism    Stress incontinence, female    Thyroid disease    Type 2 diabetes mellitus, without long-term current use of insulin ([place]) 01/06/2019   Vitamin D deficiency 01/06/2019    Tobacco Use: Social History   Tobacco Use  Smoking Status Former   Packs/day: 0.50   Years: 15.00   Pack years: 7.50   Types: Cigarettes   Quit date: 11/2020   Years since quitting: 0.4  Smokeless Tobacco Never    Labs: Recent Review Scientist, physiological     Labs for ITP Cardiac and Pulmonary Rehab Latest Ref Rng & Units 12/06/2020   Cholestrol 0 - 200 mg/dL 201(H)   LDLCALC 0 - 99 mg/dL 134(H)   HDL >40 mg/dL 51   Trlycerides <150  mg/dL 78   Hemoglobin A1c 4.8 - 5.6 % 9.4(H)        Exercise Target Goals: Exercise Program Goal: Individual exercise prescription set using results from initial 6 min walk test and THRR while considering  patients activity barriers and safety.   Exercise Prescription Goal: Initial exercise prescription builds to 30-45 minutes a day of aerobic activity, 2-3 days per week.  Home exercise guidelines will be given to patient during program as part of exercise prescription that the participant will acknowledge.   Education: Aerobic Exercise: - Group verbal and visual presentation on the components of exercise prescription. Introduces F.I.T.T principle from ACSM for exercise prescriptions.  Reviews F.I.T.T. principles of aerobic exercise including progression. Written material given at graduation.   Education: Resistance Exercise: - Group verbal and visual presentation on the components of exercise prescription. Introduces F.I.T.T principle from ACSM for exercise prescriptions  Reviews F.I.T.T. principles of resistance exercise including progression. Written material given at graduation. Flowsheet Row Cardiac Rehab from 05/14/2021 in [facility] Cardiac and Pulmonary Rehab  Date 02/26/21  Educator as  Instruction Review Code 1- Verbalizes Understanding        Education: Exercise & Equipment Safety: - Individual verbal instruction and demonstration of equipment use and safety with use of the equipment. Flowsheet Row Cardiac Rehab from 05/14/2021 in [facility] Cardiac and Pulmonary Rehab  Education need identified 01/30/21  Date 01/30/21  Educator AS  Instruction Review Code 1- Verbalizes Understanding       Education: Exercise Physiology & General Exercise Guidelines: - Group verbal and written instruction with models to review the exercise physiology of the cardiovascular system and associated critical values. Provides general exercise guidelines with specific guidelines to those with heart or  lung disease.    Education: Flexibility, Balance, Mind/Body Relaxation: - Group verbal and visual presentation with interactive activity on the components of exercise prescription. Introduces F.I.T.T principle from ACSM for exercise prescriptions. Reviews F.I.T.T. principles of flexibility and balance exercise training including progression. Also discusses the mind body connection.  Reviews various relaxation techniques to help reduce and manage stress (i.e. Deep breathing, progressive muscle relaxation, and visualization). Balance handout provided to take home. Written material given at graduation.   Activity Barriers & Risk Stratification:  Activity Barriers & Cardiac Risk Stratification - 01/17/21 1505       Activity Barriers & Cardiac Risk Stratification   Activity Barriers Joint Problems   rotator cuff injury, injections in both knees and shoulders   Cardiac Risk Stratification Moderate             6 Minute Walk:  6 Minute Walk     Row Name 01/30/21 1646         6 Minute Walk  Phase Initial     Distance 915 feet     Walk Time 6 minutes     # of Rest Breaks 0     MPH 1.73     METS 3.2     RPE 13     Perceived Dyspnea  1     VO2 Peak 11.3     Symptoms No     Resting HR 62 bpm     Resting BP 138/62     Resting Oxygen Saturation  99 %     Exercise Oxygen Saturation  during 6 min walk 98 %     Max Ex. HR 86 bpm     Max Ex. BP 186/66     2 Minute Post BP 136/64              Oxygen Initial Assessment:   Oxygen Re-Evaluation:   Oxygen Discharge (Final Oxygen Re-Evaluation):   Initial Exercise Prescription:  Initial Exercise Prescription - 01/30/21 1600       Date of Initial Exercise RX and Referring Provider   Date 01/30/21    Referring Provider Patwardhan      Treadmill   MPH 1.7    Grade 1.5    Minutes 15    METs 2.6      Recumbant Bike   Level 1    RPM 60    Minutes 15    METs 3      NuStep   Level 1    SPM 80    Minutes 15     METs 3      REL-XR   Level 1    Speed 50    Minutes 15    METs 3      Track   Laps 38    Minutes 15      Prescription Details   Frequency (times per week) 3    Duration Progress to 30 minutes of continuous aerobic without signs/symptoms of physical distress      Intensity   THRR 40-80% of Max Heartrate 95-139    Ratings of Perceived Exertion 11-13    Perceived Dyspnea 0-4      Resistance Training   Training Prescription Yes    Weight 3lb    Reps 10-15             Perform Capillary Blood Glucose checks as needed.  Exercise Prescription Changes:   Exercise Prescription Changes     Row Name 01/30/21 1600 02/17/21 1500 03/04/21 1400 03/31/21 1100 04/15/21 0700     Response to Exercise   Blood Pressure (Admit) 138/62 128/56 142/62 118/58 128/64   Blood Pressure (Exercise) 186/66 134/64 160/60 -- --   Blood Pressure (Exit) 136/64 132/82 128/56 104/58 112/60   Heart Rate (Admit) 62 bpm 83 bpm 70 bpm 73 bpm 77 bpm   Heart Rate (Exercise) 86 bpm 106 bpm 110 bpm 87 bpm 99 bpm   Heart Rate (Exit) 60 bpm 82 bpm 87 bpm 74 bpm 80 bpm   Oxygen Saturation (Admit) 99 % -- -- -- --   Oxygen Saturation (Exercise) 98 % -- -- -- --   Rating of Perceived Exertion (Exercise) _0 Perceived Dyspnea (Exercise) 1 -- -- -- --   Symptoms none -- none none none   Comments -- third day exercise -- -- --   Duration -- Progress to 30 minutes of  aerobic without signs/symptoms of physical distress Continue with 30 min of aerobic  exercise without signs/symptoms of physical distress. Continue with 30 min of aerobic exercise without signs/symptoms of physical distress. Continue with 30 min of aerobic exercise without signs/symptoms of physical distress.   Intensity -- THRR unchanged THRR unchanged THRR unchanged THRR unchanged     Progression   Progression -- Continue to progress workloads to maintain intensity without signs/symptoms of physical distress. Continue to progress  workloads to maintain intensity without signs/symptoms of physical distress. Continue to progress workloads to maintain intensity without signs/symptoms of physical distress. Continue to progress workloads to maintain intensity without signs/symptoms of physical distress.   Average METs -- 2.55 2.76 2.53 2.86     Resistance Training   Training Prescription -- Yes Yes Yes Yes   Weight -- 3 lb 3 lb 4 lb 4 lb   Reps -- 10-15 10-15 10-15 10-15     Interval Training   Interval Training -- -- No No No     Treadmill   MPH -- 1.3 1.7 1.7 1.7   Grade -- 1 3.5 3.5 2   Minutes -- $RemoveBe'15 15 15 15   'qGrltgCli$ METs -- 2.17 3.12 3.12 2.77     Recumbant Bike   Level -- -- $Rem'1 1 2   'rCtr$ Minutes -- -- $Rem'15 15 15   'hzgP$ METs -- -- 2.39 2.94 2.95     NuStep   Level -- -- 1 -- --   Minutes -- -- 15 -- --     REL-XR   Level -- -- -- 4 --   Minutes -- -- -- 15 --   METs -- -- -- 1.4 --     Track   Laps -- -- -- 21 --   Minutes -- -- -- 15 --     Oxygen   Maintain Oxygen Saturation -- -- 88% or higher 88% or higher 88% or higher    Row Name 04/29/21 1500 05/12/21 1700           Response to Exercise   Blood Pressure (Admit) 110/58 132/70      Blood Pressure (Exit) 110/60 128/58      Heart Rate (Admit) 82 bpm 76 bpm      Heart Rate (Exercise) 103 bpm 99 bpm      Heart Rate (Exit) 89 bpm 85 bpm      Rating of Perceived Exertion (Exercise) 13 12      Symptoms none none      Duration Continue with 30 min of aerobic exercise without signs/symptoms of physical distress. Continue with 30 min of aerobic exercise without signs/symptoms of physical distress.      Intensity THRR unchanged THRR unchanged        Progression   Progression Continue to progress workloads to maintain intensity without signs/symptoms of physical distress. Continue to progress workloads to maintain intensity without signs/symptoms of physical distress.      Average METs 2.45 2.25        Resistance Training   Training Prescription Yes Yes       Weight 4 lb 4 lb      Reps 10-15 10-15        Interval Training   Interval Training No No        Treadmill   MPH 1.7 1.7      Grade 0.5 0      Minutes 15 15      METs 2.3 15        NuStep   Level 4 --  Minutes 15 --      METs 1.8 --               Exercise Comments:   Exercise Comments     Row Name 02/03/21 1425           Exercise Comments First full day of exercise!  Patient was oriented to gym and equipment including functions, settings, policies, and procedures.  Patient's individual exercise prescription and treatment plan were reviewed.  All starting workloads were established based on the results of the 6 minute walk test done at initial orientation visit.  The plan for exercise progression was also introduced and progression will be customized based on patient's performance and goals.                Exercise Goals and Review:   Exercise Goals     Row Name 01/30/21 1649             Exercise Goals   Increase Physical Activity Yes       Intervention Provide advice, education, support and counseling about physical activity/exercise needs.;Develop an individualized exercise prescription for aerobic and resistive training based on initial evaluation findings, risk stratification, comorbidities and participant's personal goals.       Expected Outcomes Short Term: Attend rehab on a regular basis to increase amount of physical activity.;Long Term: Add in home exercise to make exercise part of routine and to increase amount of physical activity.;Long Term: Exercising regularly at least 3-5 days a week.       Increase Strength and Stamina Yes       Intervention Provide advice, education, support and counseling about physical activity/exercise needs.;Develop an individualized exercise prescription for aerobic and resistive training based on initial evaluation findings, risk stratification, comorbidities and participant's personal goals.       Expected Outcomes  Short Term: Increase workloads from initial exercise prescription for resistance, speed, and METs.;Short Term: Perform resistance training exercises routinely during rehab and add in resistance training at home;Long Term: Improve cardiorespiratory fitness, muscular endurance and strength as measured by increased METs and functional capacity (6MWT)       Able to understand and use rate of perceived exertion (RPE) scale Yes       Intervention Provide education and explanation on how to use RPE scale       Expected Outcomes Short Term: Able to use RPE daily in rehab to express subjective intensity level;Long Term:  Able to use RPE to guide intensity level when exercising independently       Able to understand and use Dyspnea scale Yes       Intervention Provide education and explanation on how to use Dyspnea scale       Expected Outcomes Short Term: Able to use Dyspnea scale daily in rehab to express subjective sense of shortness of breath during exertion;Long Term: Able to use Dyspnea scale to guide intensity level when exercising independently       Knowledge and understanding of Target Heart Rate Range (THRR) Yes       Intervention Provide education and explanation of THRR including how the numbers were predicted and where they are located for reference       Expected Outcomes Short Term: Able to state/look up THRR;Short Term: Able to use daily as guideline for intensity in rehab;Long Term: Able to use THRR to govern intensity when exercising independently       Able to check pulse independently Yes  Intervention Provide education and demonstration on how to check pulse in carotid and radial arteries.;Review the importance of being able to check your own pulse for safety during independent exercise       Expected Outcomes Short Term: Able to explain why pulse checking is important during independent exercise;Long Term: Able to check pulse independently and accurately       Understanding of Exercise  Prescription Yes       Intervention Provide education, explanation, and written materials on patient's individual exercise prescription       Expected Outcomes Short Term: Able to explain program exercise prescription;Long Term: Able to explain home exercise prescription to exercise independently                Exercise Goals Re-Evaluation :  Exercise Goals Re-Evaluation     [place] Name 02/03/21 1425 02/17/21 1504 03/04/21 1430 03/17/21 1420 03/31/21 1117     Exercise Goal Re-Evaluation   Exercise Goals Review Increase Physical Activity;Able to understand and use rate of perceived exertion (RPE) scale;Knowledge and understanding of Target Heart Rate Range (THRR);Understanding of Exercise Prescription;Increase Strength and Stamina;Able to understand and use Dyspnea scale;Able to check pulse independently Increase Physical Activity;Increase Strength and Stamina Increase Physical Activity;Increase Strength and Stamina;Understanding of Exercise Prescription Increase Physical Activity;Increase Strength and Stamina Increase Physical Activity;Increase Strength and Stamina   Comments Reviewed RPE and dyspnea scales, THR and program prescription with pt today.  Pt voiced understanding and was given a copy of goals to take home. Thurma is working on building up to prescribed speed on TM.  She reaches THR range.  We will continue to monitor progress. Mihika is doing well in rehab. She is now up to 3.5% grade on the treadmill and 11 watts on the bike.  We will continue to monitor her progress. Jelene is doing some sit ups outside program sessions.  EP staff will review home exercise Reann has had a hard time tolerating exercise due to MSK limitations. Staff has tried to increase loads for patient, however, patient declined. However, she did increase to 4 lbs for handweights. XR level was up to 4 but she recently has been working at level 1. It would be beneficial for her to maintain a consistent level with each  exercise. Will continue to monitor and progress as appropriate and as tolerated.   Expected Outcomes Short: Use RPE daily to regulate intensity. Long: Follow program prescription in THR. Short:  continue to build stamina on TM Long: reach prescribed speed Short: Continue to increase workloads Long: Continue to improve stamina Short: add one day in addition to program sessions Long: maintain exercise on her own Short: Maintain consistent loads and increase gradually Long: Increase overall MET level    Row Name 04/07/21 1405 04/15/21 0726 04/15/21 0727 04/29/21 1555 05/12/21 1734     Exercise Goal Re-Evaluation   Exercise Goals Review Increase Physical Activity;Increase Strength and Stamina;Understanding of Exercise Prescription Increase Physical Activity;Increase Strength and Stamina -- Increase Physical Activity;Increase Strength and Stamina;Understanding of Exercise Prescription Increase Physical Activity;Increase Strength and Stamina   Comments Marrisa is interested in joining the [facility] when she is done with the program. She is able to walk at home for exercise currently. She is doing well in the program. She is wanted increase her energy levels. -- Tonnette attends consistently and reaches her THR range.Staff will encourage her to continue adding incline to TM. Only attended once since last review with holidays. Rithika has been getting back to consistent attendance.  She reaches THR range most sessions and has moved to 4 lb for strength work.  We will continue to monitor progress.   Expected Outcomes Short: add more exercise at home and join the [place]. Long: workout at the [facility] independently. -- Short: add incline to TM Long: complete HT program short: return to regular attendance Long: Continue to improve stamina. Short: attend consistently Long: improve average MET level            Discharge Exercise Prescription (Final Exercise Prescription Changes):  Exercise Prescription Changes -  05/12/21 1700       Response to Exercise   Blood Pressure (Admit) 132/70    Blood Pressure (Exit) 128/58    Heart Rate (Admit) 76 bpm    Heart Rate (Exercise) 99 bpm    Heart Rate (Exit) 85 bpm    Rating of Perceived Exertion (Exercise) 12    Symptoms none    Duration Continue with 30 min of aerobic exercise without signs/symptoms of physical distress.    Intensity THRR unchanged      Progression   Progression Continue to progress workloads to maintain intensity without signs/symptoms of physical distress.    Average METs 2.25      Resistance Training   Training Prescription Yes    Weight 4 lb    Reps 10-15      Interval Training   Interval Training No      Treadmill   MPH 1.7    Grade 0    Minutes 15    METs 15             Nutrition:  Target Goals: Understanding of nutrition guidelines, daily intake of sodium '1500mg'$ , cholesterol '200mg'$ , calories 30% from fat and 7% or less from saturated fats, daily to have 5 or more servings of fruits and vegetables.  Education: All About Nutrition: -Group instruction provided by verbal, written material, interactive activities, discussions, models, and posters to present general guidelines for heart healthy nutrition including fat, fiber, MyPlate, the role of sodium in heart healthy nutrition, utilization of the nutrition label, and utilization of this knowledge for meal planning. Follow up email sent as well. Written material given at graduation. Flowsheet Row Cardiac Rehab from 05/14/2021 in [facility] Cardiac and Pulmonary Rehab  Education need identified 01/30/21       Biometrics:  Pre Biometrics - 01/30/21 1650       Pre Biometrics   Height $Remov'5\' 11"'uCIzgC$  (1.803 m)    Weight 184 lb 1.6 oz (83.5 kg)    BMI (Calculated) 25.69    Single Leg Stand 8.9 seconds              Nutrition Therapy Plan and Nutrition Goals:  Nutrition Therapy & Goals - 03/24/21 1500       Nutrition Therapy   RD appointment deferred Yes   pt would  not like to meet with dietitian at this time, will continue to follow up     Personal Nutrition Goals   Comments pt would not like to meet with dietitian at this time, will continue to follow up             Nutrition Assessments:  MEDIFICTS Score Key: ?70 Need to make dietary changes  40-70 Heart Healthy Diet ? 40 Therapeutic Level Cholesterol Diet  Flowsheet Row Cardiac Rehab from 01/30/2021 in [facility] Cardiac and Pulmonary Rehab  Picture Your Plate Total Score on Admission 51      Picture Your Plate Scores: <25 Unhealthy dietary  pattern with much room for improvement. 41-50 Dietary pattern unlikely to meet recommendations for good health and room for improvement. 51-60 More healthful dietary pattern, with some room for improvement.  >60 Healthy dietary pattern, although there may be some specific behaviors that could be improved.    Nutrition Goals Re-Evaluation:  Nutrition Goals Re-Evaluation     Row Name 03/17/21 1421 04/07/21 1411 05/01/21 1417         Goals   Current Weight -- 182 lb (82.6 kg) 185 lb (83.9 kg)     Nutrition Goal -- -- Maintain Weight     Comment Astria has not met with RD yet Victor does not want to meet with the dietician. She has no cencerns with her nutritional health. Audryna does not want to meet with the dietician. She has no cencerns with her nutritional health. She wants to maintain her weight.     Expected Outcome ST: schedule and meet with RD Short: inform staff of nutritions changes. Long: make healthy eating choices. Short: inform staff of nutritions changes. Long: make healthy eating choices.              Nutrition Goals Discharge (Final Nutrition Goals Re-Evaluation):  Nutrition Goals Re-Evaluation - 05/01/21 1417       Goals   Current Weight 185 lb (83.9 kg)    Nutrition Goal Maintain Weight    Comment Elize does not want to meet with the dietician. She has no cencerns with her nutritional health. She wants to maintain her  weight.    Expected Outcome Short: inform staff of nutritions changes. Long: make healthy eating choices.             Psychosocial: Target Goals: Acknowledge presence or absence of significant depression and/or stress, maximize coping skills, provide positive support system. Participant is able to verbalize types and ability to use techniques and skills needed for reducing stress and depression.   Education: Stress, Anxiety, and Depression - Group verbal and visual presentation to define topics covered.  Reviews how body is impacted by stress, anxiety, and depression.  Also discusses healthy ways to reduce stress and to treat/manage anxiety and depression.  Written material given at graduation. Flowsheet Row Cardiac Rehab from 05/14/2021 in [facility] Cardiac and Pulmonary Rehab  Date 04/09/21  Educator AS  Instruction Review Code 1- Verbalizes Understanding       Education: Sleep Hygiene -Provides group verbal and written instruction about how sleep can affect your health.  Define sleep hygiene, discuss sleep cycles and impact of sleep habits. Review good sleep hygiene tips.    Initial Review & Psychosocial Screening:  Initial Psych Review & Screening - 01/17/21 1509       Initial Review   Current issues with Current Stress Concerns    Source of Stress Concerns Occupation      [place]? Yes   daughter     Barriers   Psychosocial barriers to participate in program There are no identifiable barriers or psychosocial needs.;The patient should benefit from training in stress management and relaxation.      Screening Interventions   Interventions Encouraged to exercise;Provide feedback about the scores to participant;To provide support and resources with identified psychosocial needs    Expected Outcomes Short Term goal: Utilizing psychosocial counselor, staff and physician to assist with identification of specific Stressors or current issues interfering  with healing process. Setting desired goal for each stressor or current issue identified.;Long Term Goal: Stressors or current  issues are controlled or eliminated.;Short Term goal: Identification and review with participant of any Quality of Life or Depression concerns found by scoring the questionnaire.;Long Term goal: The participant improves quality of Life and PHQ9 Scores as seen by post scores and/or verbalization of changes             Quality of Life Scores:   Quality of Life - 01/30/21 1653       Quality of Life   Select Quality of Life      Quality of Life Scores   Health/Function Pre 17.92 %    Socioeconomic Pre 15.38 %    Psych/Spiritual Pre 19.71 %    Family Pre 22.6 %    GLOBAL Pre 18.39 %            Scores of 19 and below usually indicate a poorer quality of life in these areas.  A difference of  2-3 points is a clinically meaningful difference.  A difference of 2-3 points in the total score of the Quality of Life Index has been associated with significant improvement in overall quality of life, self-image, physical symptoms, and general health in studies assessing change in quality of life.  PHQ-9: Recent Review Flowsheet Data     Depression screen [facility] 2/9 03/17/2021 03/03/2021 01/30/2021   Decreased Interest 0 2 1   Down, Depressed, Hopeless 0 0 1   PHQ - 2 Score 0 2 2   Altered sleeping $RemoveBeforeDE'1 3 2   'UXhkSWwoauJjueP$ Tired, decreased energy $RemoveBeforeDE'1 2 3   'QEvnKWQWySSRrgI$ Change in appetite $RemoveBef'1 3 1   'WDLquStfnf$ Feeling bad or failure about yourself  0 0 0   Trouble concentrating 0 1 -   Moving slowly or fidgety/restless 0 0 2   Suicidal thoughts 0 0 0   PHQ-9 Score $RemoveBef'3 11 10   'DDextTXaUS$ Difficult doing work/chores Somewhat difficult Somewhat difficult Somewhat difficult      Interpretation of Total Score  Total Score Depression Severity:  1-4 = Minimal depression, 5-9 = Mild depression, 10-14 = Moderate depression, 15-19 = Moderately severe depression, 20-27 = Severe depression   Psychosocial Evaluation and  Intervention:  Psychosocial Evaluation - 03/17/21 1417       Psychosocial Evaluation & Interventions   Comments Review PHQ 9 with patient.  Score improved to a 3.  She feels stopping ozempic helped her feel better.  She has gone back to work part time.  She feels good with the part time hours.  She will let staff know if she needs info on vocational rehab    Expected Outcomes Short:attend rehab for the exercise and stress education Long: maintain good self care habits             Psychosocial Re-Evaluation:  Psychosocial Re-Evaluation     [place] Name 03/03/21 [place] 03/17/21 1413 04/07/21 1408 05/01/21 1412       Psychosocial Re-Evaluation   Current issues with Current Stress Concerns Current Stress Concerns None Identified None Identified    Comments Reviewed patient health questionnaire (PHQ-9) with patient for follow up. Previously, patients score indicated signs/symptoms of depression.  Reviewed to see if patient is improving symptom wise while in program.  Score declined and patient states that it is because she has not been able to sleep well and she does not feel good after she eats a bit of food. -- Patient reports no issues with their current mental states, sleep, stress, depression or anxiety. Will follow up with patient in a few weeks for any  changes. Patient reports no issues with their current mental states, sleep, stress, depression or anxiety. Will follow up with patient in a few weeks for any changes. She states that she has been lacking on her exercise over the holidatys and is ready to get back into the routine.    Expected Outcomes Short: Continue to work toward an improvement in [place] scores by attending [facility] regularly. Long: Continue to improve stress and depression coping skills by talking with staff and attendind [facility] regularly and work toward a positive mental state. -- Short: Continue to exercise regularly to support mental health and notify staff of any changes.  Long: maintain mental health and well being through teaching of rehab or prescribed medications independently. Short: maintain lower stress levels. Long: continue to workout to keep stress at a minimum.    Interventions Encouraged to attend Cardiac Rehabilitation for the exercise -- Encouraged to attend Cardiac Rehabilitation for the exercise Encouraged to attend Cardiac Rehabilitation for the exercise    Continue Psychosocial Services  Follow up required by staff -- Follow up required by staff Follow up required by staff             Psychosocial Discharge (Final Psychosocial Re-Evaluation):  Psychosocial Re-Evaluation - 05/01/21 1412       Psychosocial Re-Evaluation   Current issues with None Identified    Comments Patient reports no issues with their current mental states, sleep, stress, depression or anxiety. Will follow up with patient in a few weeks for any changes. She states that she has been lacking on her exercise over the holidatys and is ready to get back into the routine.    Expected Outcomes Short: maintain lower stress levels. Long: continue to workout to keep stress at a minimum.    Interventions Encouraged to attend Cardiac Rehabilitation for the exercise    Continue Psychosocial Services  Follow up required by staff             Vocational Rehabilitation: Provide vocational rehab assistance to qualifying candidates.   Vocational Rehab Evaluation & Intervention:  Vocational Rehab - 01/17/21 1508       Initial Vocational Rehab Evaluation & Intervention   Assessment shows need for Vocational Rehabilitation No             Education: Education Goals: Education classes will be provided on a variety of topics geared toward better understanding of heart health and risk factor modification. Participant will state understanding/return demonstration of topics presented as noted by education test scores.  Learning Barriers/Preferences:  Learning Barriers/Preferences  - 01/17/21 1508       Learning Barriers/Preferences   Learning Barriers None    Learning Preferences None             General Cardiac Education Topics:  AED/CPR: - Group verbal and written instruction with the use of models to demonstrate the basic use of the AED with the basic ABC's of resuscitation.   Anatomy and Cardiac Procedures: - Group verbal and visual presentation and models provide information about basic cardiac anatomy and function. Reviews the testing methods done to diagnose heart disease and the outcomes of the test results. Describes the treatment choices: Medical Management, Angioplasty, or Coronary Bypass Surgery for treating various heart conditions including Myocardial Infarction, Angina, Valve Disease, and Cardiac Arrhythmias.  Written material given at graduation. Flowsheet Row Cardiac Rehab from 05/14/2021 in [facility]  Date 02/26/21  Educator sb  Instruction Review Code 1- Verbalizes Understanding  Medication Safety: - Group verbal and visual instruction to review commonly prescribed medications for heart and lung disease. Reviews the medication, class of the drug, and side effects. Includes the steps to properly store meds and maintain the prescription regimen.  Written material given at graduation. Flowsheet Row Cardiac Rehab from 05/14/2021 in [facility] Cardiac and Pulmonary Rehab  Date 05/14/21  Educator [facility]  Instruction Review Code 1- Verbalizes Understanding       Intimacy: - Group verbal instruction through game format to discuss how heart and lung disease can affect sexual intimacy. Written material given at graduation..   Know Your Numbers and Heart Failure: - Group verbal and visual instruction to discuss disease risk factors for cardiac and pulmonary disease and treatment options.  Reviews associated critical values for Overweight/Obesity, Hypertension, Cholesterol, and Diabetes.  Discusses basics of heart failure:  signs/symptoms and treatments.  Introduces Heart Failure Zone chart for action plan for heart failure.  Written material given at graduation. Flowsheet Row Cardiac Rehab from 05/14/2021 in [facility] Cardiac and Pulmonary Rehab  Date 03/26/21  Educator [facility]  Instruction Review Code 1- Verbalizes Understanding       Infection Prevention: - Provides verbal and written material to individual with discussion of infection control including proper hand washing and proper equipment cleaning during exercise session. Flowsheet Row Cardiac Rehab from 05/14/2021 in [facility] Cardiac and Pulmonary Rehab  Date 01/30/21  Educator AS  Instruction Review Code 1- Verbalizes Understanding       Falls Prevention: - Provides verbal and written material to individual with discussion of falls prevention and safety. Flowsheet Row Cardiac Rehab from 05/14/2021 in [facility] Cardiac and Pulmonary Rehab  Date 01/30/21  Educator AS  Instruction Review Code 1- Verbalizes Understanding       Other: -Provides group and verbal instruction on various topics (see comments)   Knowledge Questionnaire Score:  Knowledge Questionnaire Score - 01/30/21 1732       Knowledge Questionnaire Score   Pre Score 18/26             Core Components/Risk Factors/Patient Goals at Admission:  Personal Goals and Risk Factors at Admission - 01/30/21 1652       Core Components/Risk Factors/Patient Goals on Admission   Tobacco Cessation Yes    Number of packs per day Quit August 2022    Intervention Offer self-teaching materials, assist with locating and accessing local/national Quit Smoking programs, and support quit date choice.;Assist the participant in steps to quit. Provide individualized education and counseling about committing to Tobacco Cessation, relapse prevention, and pharmacological support that can be provided by physician.    Expected Outcomes Short Term: Will demonstrate readiness to quit, by selecting a quit date.;Short  Term: Will quit all tobacco product use, adhering to prevention of relapse plan.;Long Term: Complete abstinence from all tobacco products for at least 12 months from quit date.    Diabetes Yes    Intervention Provide education about signs/symptoms and action to take for hypo/hyperglycemia.;Provide education about proper nutrition, including hydration, and aerobic/resistive exercise prescription along with prescribed medications to achieve blood glucose in normal ranges: Fasting glucose 65-99 mg/dL    Expected Outcomes Short Term: Participant verbalizes understanding of the signs/symptoms and immediate care of hyper/hypoglycemia, proper foot care and importance of medication, aerobic/resistive exercise and nutrition plan for blood glucose control.;Long Term: Attainment of HbA1C < 7%.    Hypertension Yes    Intervention Provide education on lifestyle modifcations including regular physical activity/exercise, weight management, moderate sodium restriction and increased  consumption of fresh fruit, vegetables, and low fat dairy, alcohol moderation, and smoking cessation.;Monitor prescription use compliance.    Expected Outcomes Short Term: Continued assessment and intervention until BP is < 140/36mm HG in hypertensive participants. < 130/82mm HG in hypertensive participants with diabetes, heart failure or chronic kidney disease.;Long Term: Maintenance of blood pressure at goal levels.    Lipids Yes    Intervention Provide education and support for participant on nutrition & aerobic/resistive exercise along with prescribed medications to achieve LDL '70mg'$ , HDL >$Remo'40mg'mucpr$ .    Expected Outcomes Short Term: Participant states understanding of desired cholesterol values and is compliant with medications prescribed. Participant is following exercise prescription and nutrition guidelines.;Long Term: Cholesterol controlled with medications as prescribed, with individualized exercise RX and with personalized nutrition plan.  Value goals: LDL < $Rem'70mg'xDGm$ , HDL > 40 mg.             Education:Diabetes - Individual verbal and written instruction to review signs/symptoms of diabetes, desired ranges of glucose level fasting, after meals and with exercise. Acknowledge that pre and post exercise glucose checks will be done for 3 sessions at entry of program. [place] from 01/17/2021 in [facility] Cardiac and Pulmonary Rehab  Date 01/17/21  Educator [facility]  Instruction Review Code 1- Verbalizes Understanding       Core Components/Risk Factors/Patient Goals Review:   Goals and Risk Factor Review     Row Name 03/17/21 1407 04/07/21 1409 05/01/21 1413         Core Components/Risk Factors/Patient Goals Review   Personal Goals Review Tobacco Cessation;Diabetes;Hypertension Tobacco Cessation;Hypertension Tobacco Cessation     Review Azzure remains tobacco free! She still has cravings.  She says she can go on until her cravings pass.  She does check her BG in the morning - usually around 106.  She had a dizzy spell and didnt feel good at work last week.  She didnt check BG or BP as she was at work.  This was right after she had been sick.  She hasnt had any more episodes.  We reviewed the importance of keeping an eye on BP and BG.  She reports taking all medications as directed. Fadumo states she has not touched any tobacco products sinc eshe quit smoking in August. Her blood pressure cuff at home reads high. Informed her to bring it in and we can check with our readings. Felisha states that she slipped up a little with her smoking. She is still trying to quit and wants to quit. Reviewed Quit smoking packet with patient and gave copy to go home.     Expected Outcomes Short:  continue to monitor risk factors and maintain tobacco cessation Long: manage risk factors long term Short: bring in blood pressure cuff. Long: maintain adequate blood pressure readings independently. Short: cut back on smoking more. Long: Quick smoking.               Core Components/Risk Factors/Patient Goals at Discharge (Final Review):   Goals and Risk Factor Review - 05/01/21 1413       Core Components/Risk Factors/Patient Goals Review   Personal Goals Review Tobacco Cessation    Review Jenan states that she slipped up a little with her smoking. She is still trying to quit and wants to quit. Reviewed Quit smoking packet with patient and gave copy to go home.    Expected Outcomes Short: cut back on smoking more. Long: Quick smoking.  ITP Comments:  ITP Comments     Row Name 01/17/21 1517 01/30/21 1733 02/03/21 1425 02/26/21 0706 03/26/21 0653   ITP Comments Initial telephone orientation completed. Diagnosis can be found in [facility] 8/11. EP orientation scheduled for Thursday 10/6 at 3pm. Completed 6MWT and gym orientation. Initial ITP created and sent for review to Dr. Emily Filbert, Medical Director. First full day of exercise!  Patient was oriented to gym and equipment including functions, settings, policies, and procedures.  Patient's individual exercise prescription and treatment plan were reviewed.  All starting workloads were established based on the results of the 6 minute walk test done at initial orientation visit.  The plan for exercise progression was also introduced and progression will be customized based on patient's performance and goals. 30 Day review completed. Medical Director ITP review done, changes made as directed, and signed approval by Medical Director. 30 Day review completed. Medical Director ITP review done, changes made as directed, and signed approval by Medical Director.    [place] Name 04/23/21 0709 04/29/21 1555 05/21/21 0851       ITP Comments 30 Day review completed. Medical Director ITP review done, changes made as directed, and signed approval by Medical Director. Patient has been out since 04/14/21 with holidays. 30 Day review completed. Medical Director ITP review done, changes made as  directed, and signed approval by Medical Director.              Comments:

## 2021-05-22 ENCOUNTER — Other Ambulatory Visit: Payer: Self-pay

## 2021-05-22 DIAGNOSIS — Z955 Presence of coronary angioplasty implant and graft: Secondary | ICD-10-CM

## 2021-05-22 DIAGNOSIS — Z48812 Encounter for surgical aftercare following surgery on the circulatory system: Secondary | ICD-10-CM | POA: Diagnosis not present

## 2021-05-22 DIAGNOSIS — I214 Non-ST elevation (NSTEMI) myocardial infarction: Secondary | ICD-10-CM

## 2021-05-22 NOTE — Progress Notes (Signed)
Daily Session Note ° °Patient Details  °Name: Monica Bell °MRN: [id number] °Date of Birth: 06/22/1960 °Referring Provider:   °Flowsheet Row Cardiac Rehab from 01/30/2021 in [facility] Cardiac and Pulmonary Rehab  °Referring Provider Patwardhan  ° °  ° ° °Encounter Date: 05/22/2021 ° °Check In: ° Session Check In - 05/22/21 1416   ° °  ° Check-In  ° Supervising physician immediately available to respond to emergencies See telemetry face sheet for immediately available ER MD   ° Location [facility]-Cardiac & Pulmonary Rehab   ° Staff Present Melissa Caiola, RDN, LDN;Leslie Castrejon, RN, BSN;Joseph Hood, RCP,RRT,BSRT   ° Virtual Visit No   ° Medication changes reported     No   ° Fall or balance concerns reported    No   ° Tobacco Cessation No Change   ° Warm-up and Cool-down Performed on first and last piece of equipment   ° Resistance Training Performed No   ° VAD Patient? No   ° PAD/SET Patient? No   °  ° Pain Assessment  ° Currently in Pain? No/denies   ° °  °  ° °  ° ° ° ° ° °Social History  ° °Tobacco Use  °Smoking Status Former  ° Packs/day: 0.50  ° Years: 15.00  ° Pack years: 7.50  ° Types: Cigarettes  ° Quit date: 11/2020  ° Years since quitting: 0.4  °Smokeless Tobacco Never  ° ° °Goals Met:  °Proper associated with RPD/PD & O2 Sat °Independence with exercise equipment °Exercise tolerated well °No report of concerns or symptoms today °Strength training completed today ° °Goals Unmet:  °Not Applicable ° °Comments: Pt able to follow exercise prescription today without complaint.  Will continue to monitor for progression. ° ° °Dr. Mark Miller is Medical Director for [facility].  °Dr. Fuad Aleskerov is Medical Director for [facility]. °

## 2021-05-28 ENCOUNTER — Encounter: Payer: BC Managed Care – PPO | Attending: Cardiology

## 2021-05-28 DIAGNOSIS — Z48812 Encounter for surgical aftercare following surgery on the circulatory system: Secondary | ICD-10-CM | POA: Insufficient documentation

## 2021-05-28 DIAGNOSIS — I252 Old myocardial infarction: Secondary | ICD-10-CM | POA: Insufficient documentation

## 2021-05-28 DIAGNOSIS — Z955 Presence of coronary angioplasty implant and graft: Secondary | ICD-10-CM | POA: Insufficient documentation

## 2021-06-05 ENCOUNTER — Encounter: Payer: Self-pay | Admitting: Cardiology

## 2021-06-05 ENCOUNTER — Telehealth: Payer: Self-pay

## 2021-06-05 ENCOUNTER — Other Ambulatory Visit: Payer: Self-pay

## 2021-06-05 ENCOUNTER — Ambulatory Visit: Payer: BC Managed Care – PPO | Admitting: Cardiology

## 2021-06-05 VITALS — BP 135/66 | HR 87 | Temp 97.9°F | Ht 71.0 in | Wt 178.0 lb

## 2021-06-05 DIAGNOSIS — I25118 Atherosclerotic heart disease of native coronary artery with other forms of angina pectoris: Secondary | ICD-10-CM

## 2021-06-05 DIAGNOSIS — K219 Gastro-esophageal reflux disease without esophagitis: Secondary | ICD-10-CM

## 2021-06-05 DIAGNOSIS — I5020 Unspecified systolic (congestive) heart failure: Secondary | ICD-10-CM

## 2021-06-05 DIAGNOSIS — I10 Essential (primary) hypertension: Secondary | ICD-10-CM

## 2021-06-05 MED ORDER — ESOMEPRAZOLE MAGNESIUM 20 MG PO CPDR: 20.0000 mg | DELAYED_RELEASE_CAPSULE | Freq: Every day | ORAL | 3 refills | Status: DC

## 2021-06-05 NOTE — Telephone Encounter (Signed)
Attempted to call patient regarding cardiac rehab and left voicemail asking for callback. Patient has not attended since 1/26.

## 2021-06-05 NOTE — Telephone Encounter (Signed)
Patient called back- she has been sick the last couple of weeks. She is due to see cardiology today, if they clear her to return to work she states she would like to be discharged. Patient will call us and keep [place] updated after today's appointment.

## 2021-06-05 NOTE — Progress Notes (Signed)
Patient referred by [name], NP for coronary artery disease  Subjective:   Monica Bell, female    DOB: [birth year], 61 y.o.   MRN: [id number]  Chief Complaint  Patient presents with   Coronary Artery Disease   Follow-up     HPI  61 year old African-American female with hypertension, hyperlipidemia, type 2 diabetes mellitus, tobacco dependence, CAD, HFrEF-now recovered EF, GERD  Patient did not have significant ischemia on her stress testing in 9/20222. Echocardiogram showed normalized EF in 02/2021.  However, she continues to have retrosternal burning sensation with certain activities such as using a mop. She does not have similar symptoms with walking on treadmill at rehab at 1.7 mph speed. She feels it if she pushes herself more. She contrasts this symptom from her NSTEMI in 11/2020 in a sense that burning back then was all the way up to her throat, along with pain in her left jaw. 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   Patient does report having severe acid reflux, which was controlled with Nexium in the past, but was switched to prilosec for insurance reasons.     Current Outpatient Medications on File Prior to Visit  Medication Sig Dispense Refill   amLODipine (NORVASC) 5 MG tablet Take 1 tablet (5 mg total) by mouth daily. 30 tablet 3   BRILINTA 90 MG TABS tablet TAKE 1 TABLET BY MOUTH TWICE DAILY 60 tablet 11   Calcium Carbonate-Vitamin D (CALTRATE 600+D PO) Take 1 tablet by mouth daily.     cetirizine (ZYRTEC) 10 MG tablet Take 10 mg by mouth daily.     cholecalciferol (VITAMIN D3) 25 MCG (1000 UNIT) tablet Take 1,000 Units by mouth daily.     GNP ASPIRIN LOW DOSE 81 MG EC tablet TAKE 1 TABLET BY MOUTH ONCE DAILY. SWALLOW WHOLE. 90 tablet 1   Investigational - Study Medication EMPACT-MI Study  Empagliflozin /or Placebo     isosorbide-hydrALAZINE (BIDIL) 20-37.5 MG tablet Take 1 tablet by mouth 3 (three) times daily.  90 tablet 3   ivabradine (CORLANOR) 5 MG TABS tablet Take 1 tablet (5 mg total) by mouth 2 (two) times daily with a meal. 60 tablet 2   levothyroxine (SYNTHROID) 137 MCG tablet Take 137 mcg by mouth daily.     losartan (COZAAR) 50 MG tablet Take 1 tablet by mouth daily.     magnesium oxide (MAG-OX) 400 MG tablet Take 400 mg by mouth daily.     metFORMIN (GLUCOPHAGE) 1000 MG tablet Take 1,000 mg by mouth 2 (two) times daily with a meal.     metoprolol succinate (TOPROL-XL) 50 MG 24 hr tablet Take 1 tablet (50 mg total) by mouth daily. Take with or immediately following a meal. 30 tablet 0   Multiple Minerals-Vitamins (CAL-MAG-ZINC-D PO) Take by mouth.     nitroGLYCERIN (NITROSTAT) 0.4 MG SL tablet Place 1 tablet (0.4 mg total) under the tongue every 5 (five) minutes as needed for chest pain. 30 tablet 3   omeprazole (PRILOSEC) 20 MG capsule Take 20 mg by mouth daily.     ranolazine (RANEXA) 500 MG 12 hr tablet Take 1 tablet (500 mg total) by mouth 2 (two) times daily. 60 tablet 3   rosuvastatin (CRESTOR) 40 MG tablet Take 1 tablet (40 mg total) by mouth daily. 30 tablet 0   TRESIBA FLEXTOUCH 200 UNIT/ML FlexTouch Pen Inject 45 Units into the skin daily.     vitamin B-12 (  CYANOCOBALAMIN) 100 MCG tablet Take 100 mcg by mouth daily.     No current facility-administered medications on file prior to visit.    Cardiovascular and other pertinent studies:  EKG 06/05/2021: Sinus rhythm 80 bpm   Echocardiogram 03/18/2021:  Normal LV systolic function with visual EF 60-65%. Left ventricle cavity  is normal in size. Moderate left ventricular hypertrophy. Normal global  wall motion. Indeterminate diastolic filling pattern, indeterminate LAP.  No significant valvular disease.  Small pericardial effusion. There is no hemodynamic significance.  Compared to study 12/06/2020: LVEF improved from 40-45% to 60-65%  otherwise no significant change.   Lexiscan Sestamibi stress test 01/15/2021: Lexiscan nuclear  stress test performed using 1-day protocol. Stress EKG is non-diagnostic, as this is pharmacological stress test. In addition, stress EKG at 78% MPHR showed sinus tachycardia, 1 mm inferolateral downsloping ST depression, that normalize 1 min into recovery.  Normal myocardial perfusion. Stress LVEF 35% with moderate global decrease in myocardial thickening and wall motion. High risk study due to low stress LVEF.  EKG 12/10/2020: Sinus rhythm 65 bpm Possible old anteroseptal infarct Inferolateral T wave inversion, nonspecific   Coronary intervention 12/06/2020: LM: Normal LAD: No significant disease Lcx: Prox thrombotic 95% stenosis, 60% ostial OM1 stenosis        Mid Lcx occlusion, likely chronic RCA: Mid 60% disease. Right-to-left collaterals to occluded Lcx   Successful percutaneous coronary intervention prox Lcx-OM1        PTCA and stent placement 3.0 X 30 mm Onyx Frontier drug-eluting  stent, post dilatation with 3.25X8 and 3.5X15 mm Browning balloons up to 20 atm   Distal embolization into small branches of OM1. Aggrastat bolus and infusion started.  Echocardiogram 12/06/2020:  1. LVEF is depressed with hypokinesis of the distal anterior, basal  inferior and apical walls and severe hypokinesis of the distal lateral  walls. LVEF 40 to 45%. Left ventricular diastolic parameters are  consistent with Grade I diastolic dysfunction  (impaired relaxation). Elevated left atrial pressure.   2. Right ventricular systolic function is normal. The right ventricular  size is normal.   3. Soft tissue density epicardially, seen most prominently inferior to  RV. May represent adipose tissue, cannot exclude inflammation with  consolidation. No old images to compare . a small pericardial effusion is  present.   4. The mitral valve is normal in structure. No evidence of mitral valve  regurgitation.   5. The aortic valve is normal in structure. Aortic valve regurgitation is  not visualized.   6. The  inferior vena cava is normal in size with greater than 50%  respiratory variability, suggesting right atrial pressure of 3 mmHg.    Recent labs: 01/02/2021: (After discontiuing Entresto) Glucose 269, BUN/Cr 9/1.26. EGFR 49. Na/K 139/4.1.   12/24/2020: (On Entresto 24-26 mg bid) Glucose 243, BUN/Cr 11/1.82. EGFR 32. Na/K 132/3.8.   12/16/2020: (On Entresto 49-51 mg bid) Glucose 195, BUN/Cr 11/1.47. EGFR 41. Na/K 139/4.1.   12/07/2020: (On Entresto 24-26 mg bid) Glucose 180, BUN/Cr 10/0.87. EGFR >60. Na/K 136/4.4.  H/H 11.7/35.7. MCV 87. Platelets 370 HbA1C 9.4% Chol 201, TG 78, HDL 51, LDL 134 TSH N/A  Results for Monica Bell, Monica Bell (MRN [id number]) as of 12/10/2020 11:10  Ref. Range 12/06/2020 01:39 12/06/2020 05:03 12/06/2020 07:46 12/06/2020 10:07  Troponin I (High Sensitivity) Latest Ref Range: <18 ng/L 149 (HH) 396 (HH) 734 (HH) 1,050 (HH)     Review of Systems  Constitutional: Positive for malaise/fatigue.  Cardiovascular:  Positive for chest pain (Different from  her angina at the time of MI). Negative for dyspnea on exertion, leg swelling, palpitations and syncope.       Vitals:   06/05/21 1412  BP: 135/66  Pulse: 87  Temp: 97.9 F (36.6 C)  SpO2: 97%     Body mass index is 24.83 kg/m. Filed Weights   06/05/21 1412  Weight: 178 lb (80.7 kg)     Objective:   Physical Exam Vitals and nursing note reviewed.  Constitutional:      General: She is not in acute distress. Neck:     Vascular: No JVD.  Cardiovascular:     Rate and Rhythm: Normal rate and regular rhythm.     Pulses: Decreased pulses.     Heart sounds: Normal heart sounds. No murmur heard. Pulmonary:     Effort: Pulmonary effort is normal.     Breath sounds: Normal breath sounds. No wheezing or rales.  Musculoskeletal:     Right lower leg: No edema.     Left lower leg: No edema.        Assessment & Recommendations:   61 year old African-American female with hypertension, hyperlipidemia, type  2 diabetes mellitus, tobacco dependence, CAD, HFrEF-now recovered EF, GERD   CAD: NSTEMI 11/2020. Culprit: thrombotic 95% stenosis Prox Lcx into large OM1 Non-culprit: Mid Lcx chronic occlusion with R-to-L grade 3 collaterals                     Mid RCA 60% stenosis Recommend DAPT with Aspirin and Brilinta till 11/2021 Metoprolol succinate 50 mg daily Crestor 40 mg daily Aggressive diabetes management with PCP (A1C is 9%) No ischemia, low stress LVEF 35% (SPECT MPI 12/2020) LVEF normalized on echocardiogram 02/2021. However, symptoms of retrosternal burning with exertion remain. Differentials include angina or GERD. 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 She is unsure about repeat procedure, still.  Recommend switching prilosec to Nexium. F/u in 4 weeks. If no improvement, will again discuss repeat coronary angiography and considering PCI to mid Lcx CTO.  Continue DAPT with Aspirin and Brilinta t least till 11/2021. Continue current angi anginal therapy-including amlodipine 5 mg, metoprolol succinate 50 mg, Ranexa 500 mg bid. Continue Crestor 40 mg  HFrEF: Clinically euvolumic.  LVEF recovered. Entresto discontinued due to AKI Continue metoprolol succinate 50 mg daily Continue Bidil 20-37.5 mg tid. Encourage hydration. Continue Corlanor 5 mg bid. Enrolled in EMPACT-MI clinical trial (Empaglifozin vs placebo in post MI heart failure patients).  Consider using non-SGLT2i for diabetes management.   Hypertension: Controlled   Mixed hyperlipidemia: Uncontrolled. LDL 134.  Currently on Crestor 40 mg daily.  Will get lipid panel checked with PCP next month If LDL remains >70, will add Repatha.   Type 2 DM: Uncontrolled. Defer management to primary team.  Enrolled in EMPACT-MI clinical trial (Empaglifozin vs placebo in post MI heart failure patients).  Consider using non-SGLT2i for diabetes management.    F/u in 4  weeks   Nigel Mormon, MD Pager: [phone] Office: [phone]

## 2021-06-06 ENCOUNTER — Encounter: Payer: Self-pay | Admitting: Cardiology

## 2021-06-06 ENCOUNTER — Telehealth: Payer: Self-pay

## 2021-06-06 NOTE — Telephone Encounter (Signed)
Done. On chart. Patient could see it on MyChart.  Thanks MJP

## 2021-06-06 NOTE — Telephone Encounter (Signed)
patient needs a new letter from you. stating she can return to work 06/25/21, The letter needs to say with restrictions that she should do no heavy lifting over 10 pounds. also only work 4 hours per day and no mopping or strenous work

## 2021-06-09 ENCOUNTER — Encounter: Payer: Self-pay | Admitting: Cardiology

## 2021-06-12 ENCOUNTER — Ambulatory Visit: Payer: BC Managed Care – PPO | Admitting: Podiatry

## 2021-06-12 NOTE — Patient Instructions (Addendum)
Discharge Patient Instructions  Patient Details  Name: Monica Bell MRN: [id number] Date of Birth: Oct 03, 1960 Referring Provider:  Nigel Mormon, MD   Number of Visits: 29  Reason for Discharge:  Patient reached a stable level of exercise. Early Exit:  Back to work  Smoking History:  Social History   Tobacco Use  Smoking Status Former   Packs/day: 0.50   Years: 15.00   Pack years: 7.50   Types: Cigarettes   Quit date: 11/2020   Years since quitting: 0.5  Smokeless Tobacco Never    Diagnosis:  NSTEMI (non-ST elevated myocardial infarction) ([place])  Status post coronary artery stent placement  Initial Exercise Prescription:  Initial Exercise Prescription - 01/30/21 1600       Date of Initial Exercise RX and Referring Provider   Date 01/30/21    Referring Provider Patwardhan      Treadmill   MPH 1.7    Grade 1.5    Minutes 15    METs 2.6      Recumbant Bike   Level 1    RPM 60    Minutes 15    METs 3      NuStep   Level 1    SPM 80    Minutes 15    METs 3      REL-XR   Level 1    Speed 50    Minutes 15    METs 3      Track   Laps 38    Minutes 15      Prescription Details   Frequency (times per week) 3    Duration Progress to 30 minutes of continuous aerobic without signs/symptoms of physical distress      Intensity   THRR 40-80% of Max Heartrate 95-139    Ratings of Perceived Exertion 11-13    Perceived Dyspnea 0-4      Resistance Training   Training Prescription Yes    Weight 3lb    Reps 10-15             Discharge Exercise Prescription (Final Exercise Prescription Changes):  Exercise Prescription Changes - 05/28/21 0900       Response to Exercise   Blood Pressure (Admit) 118/62    Blood Pressure (Exit) 114/60    Heart Rate (Admit) 81 bpm    Heart Rate (Exercise) 110 bpm    Heart Rate (Exit) 85 bpm    Rating of Perceived Exertion (Exercise) 13    Symptoms none    Duration Continue with 30 min of aerobic  exercise without signs/symptoms of physical distress.    Intensity THRR unchanged      Progression   Progression Continue to progress workloads to maintain intensity without signs/symptoms of physical distress.    Average METs 2.15      Resistance Training   Training Prescription Yes    Weight 4 lb    Reps 10-15      Interval Training   Interval Training No      Treadmill   MPH 1.7    Grade 0    Minutes 15    METs 2.3      REL-XR   Level 4    Minutes 15    METs 2      Oxygen   Maintain Oxygen Saturation 88% or higher             Functional Capacity:  6 Minute Walk     Row Name 01/30/21 1646  6 Minute Walk   Phase Initial     Distance 915 feet     Walk Time 6 minutes     # of Rest Breaks 0     MPH 1.73     METS 3.2     RPE 13     Perceived Dyspnea  1     VO2 Peak 11.3     Symptoms No     Resting HR 62 bpm     Resting BP 138/62     Resting Oxygen Saturation  99 %     Exercise Oxygen Saturation  during 6 min walk 98 %     Max Ex. HR 86 bpm     Max Ex. BP 186/66     2 Minute Post BP 136/64               Nutrition & Weight - Outcomes:  Pre Biometrics - 01/30/21 1650       Pre Biometrics   Height 5\' 11"  (1.803 m)    Weight 184 lb 1.6 oz (83.5 kg)    BMI (Calculated) 25.69    Single Leg Stand 8.9 seconds              Goals reviewed with patient; copy given to patient.

## 2021-06-12 NOTE — Progress Notes (Signed)
Discharge Progress Report  Patient Details  Name: Monica Bell MRN: [id number] Date of Birth: 1960-05-21 Referring Provider:   Flowsheet Row Cardiac Rehab from 01/30/2021 in [facility]  Referring Provider Patwardhan        Number of Visits: 29  Reason for Discharge:  Patient reached a stable level of exercise. Early Exit:  Back to work  Smoking History:  Social History   Tobacco Use  Smoking Status Former   Packs/day: 0.50   Years: 15.00   Pack years: 7.50   Types: Cigarettes   Quit date: 11/2020   Years since quitting: 0.5  Smokeless Tobacco Never    Diagnosis:  No diagnosis found.  ADL [facility]SD:   Initial Exercise Prescription:  Initial Exercise Prescription - 01/30/21 1600       Date of Initial Exercise RX and Referring Provider   Date 01/30/21    Referring Provider Patwardhan      Treadmill   MPH 1.7    Grade 1.5    Minutes 15    METs 2.6      Recumbant Bike   Level 1    RPM 60    Minutes 15    METs 3      NuStep   Level 1    SPM 80    Minutes 15    METs 3      REL-XR   Level 1    Speed 50    Minutes 15    METs 3      Track   Laps 38    Minutes 15      Prescription Details   Frequency (times per week) 3    Duration Progress to 30 minutes of continuous aerobic without signs/symptoms of physical distress      Intensity   THRR 40-80% of Max Heartrate 95-139    Ratings of Perceived Exertion 11-13    Perceived Dyspnea 0-4      Resistance Training   Training Prescription Yes    Weight 3lb    Reps 10-15             Discharge Exercise Prescription (Final Exercise Prescription Changes):  Exercise Prescription Changes - 05/28/21 0900       Response to Exercise   Blood Pressure (Admit) 118/62    Blood Pressure (Exit) 114/60    Heart Rate (Admit) 81 bpm    Heart Rate (Exercise) 110 bpm    Heart Rate (Exit) 85 bpm    Rating of Perceived Exertion (Exercise) 13    Symptoms none    Duration Continue  with 30 min of aerobic exercise without signs/symptoms of physical distress.    Intensity THRR unchanged      Progression   Progression Continue to progress workloads to maintain intensity without signs/symptoms of physical distress.    Average METs 2.15      Resistance Training   Training Prescription Yes    Weight 4 lb    Reps 10-15      Interval Training   Interval Training No      Treadmill   MPH 1.7    Grade 0    Minutes 15    METs 2.3      REL-XR   Level 4    Minutes 15    METs 2      Oxygen   Maintain Oxygen Saturation 88% or higher             Functional Capacity:  [place] Name 01/30/21 1646         6 Minute Walk   Phase Initial     Distance 915 feet     Walk Time 6 minutes     # of Rest Breaks 0     MPH 1.73     METS 3.2     RPE 13     Perceived Dyspnea  1     VO2 Peak 11.3     Symptoms No     Resting HR 62 bpm     Resting BP 138/62     Resting Oxygen Saturation  99 %     Exercise Oxygen Saturation  during 6 min walk 98 %     Max Ex. HR 86 bpm     Max Ex. BP 186/66     2 Minute Post BP 136/64               Nutrition & Weight - Outcomes:  Pre Biometrics - 01/30/21 1650       Pre Biometrics   Height 5\' 11"  (1.803 m)    Weight 184 lb 1.6 oz (83.5 kg)    BMI (Calculated) 25.69    Single Leg Stand 8.9 seconds              Nutrition:  Nutrition Therapy & Goals - 03/24/21 1500       Nutrition Therapy   RD appointment deferred Yes   pt would not like to meet with dietitian at this time, will continue to follow up     Personal Nutrition Goals   Comments pt would not like to meet with dietitian at this time, will continue to follow up              Goals reviewed with patient; copy given to patient.

## 2021-06-12 NOTE — Progress Notes (Addendum)
Cardiac Individual Treatment Plan  Patient Details  Name: Monica Bell MRN: [id number] Date of Birth: 1960-07-25 Referring Provider:   Flowsheet Row Cardiac Rehab from 01/30/2021 in [facility] Cardiac and Pulmonary Rehab  Referring Provider Patwardhan       Initial Encounter Date:  Flowsheet Row Cardiac Rehab from 01/30/2021 in [facility] Cardiac and Pulmonary Rehab  Date 01/30/21       Visit Diagnosis: No diagnosis found.  Patient's Home Medications on Admission:  Current Outpatient Medications:    amLODipine (NORVASC) 5 MG tablet, Take 1 tablet (5 mg total) by mouth daily., Disp: 30 tablet, Rfl: 3   BRILINTA 90 MG TABS tablet, TAKE 1 TABLET BY MOUTH TWICE DAILY, Disp: 60 tablet, Rfl: 11   Calcium Carbonate-Vitamin D (CALTRATE 600+D PO), Take 1 tablet by mouth daily., Disp: , Rfl:    cetirizine (ZYRTEC) 10 MG tablet, Take 10 mg by mouth daily., Disp: , Rfl:    cholecalciferol (VITAMIN D3) 25 MCG (1000 UNIT) tablet, Take 1,000 Units by mouth daily., Disp: , Rfl:    esomeprazole (NEXIUM) 20 MG capsule, Take 1 capsule (20 mg total) by mouth daily at 12 noon., Disp: 30 capsule, Rfl: 3   GNP ASPIRIN LOW DOSE 81 MG EC tablet, TAKE 1 TABLET BY MOUTH ONCE DAILY. SWALLOW WHOLE., Disp: 90 tablet, Rfl: 1   Investigational - Study Medication, EMPACT-MI Study  Empagliflozin /or Placebo, Disp: , Rfl:    isosorbide-hydrALAZINE (BIDIL) 20-37.5 MG tablet, Take 1 tablet by mouth 3 (three) times daily. (Patient not taking: Reported on 06/05/2021), Disp: 90 tablet, Rfl: 3   ivabradine (CORLANOR) 5 MG TABS tablet, Take 1 tablet (5 mg total) by mouth 2 (two) times daily with a meal. (Patient not taking: Reported on 06/05/2021), Disp: 60 tablet, Rfl: 2   levothyroxine (SYNTHROID) 137 MCG tablet, Take 137 mcg by mouth daily., Disp: , Rfl:    losartan (COZAAR) 50 MG tablet, Take 1 tablet by mouth daily., Disp: , Rfl:    magnesium oxide (MAG-OX) 400 MG tablet, Take 400 mg by mouth daily., Disp: , Rfl:    metFORMIN  (GLUCOPHAGE) 1000 MG tablet, Take 1,000 mg by mouth 2 (two) times daily with a meal., Disp: , Rfl:    metoprolol succinate (TOPROL-XL) 50 MG 24 hr tablet, Take 1 tablet (50 mg total) by mouth daily. Take with or immediately following a meal., Disp: 30 tablet, Rfl: 0   Multiple Minerals-Vitamins (CAL-MAG-ZINC-D PO), Take by mouth., Disp: , Rfl:    nitroGLYCERIN (NITROSTAT) 0.4 MG SL tablet, Place 1 tablet (0.4 mg total) under the tongue every 5 (five) minutes as needed for chest pain., Disp: 30 tablet, Rfl: 3   ranolazine (RANEXA) 500 MG 12 hr tablet, Take 1 tablet (500 mg total) by mouth 2 (two) times daily., Disp: 60 tablet, Rfl: 3   rosuvastatin (CRESTOR) 40 MG tablet, Take 1 tablet (40 mg total) by mouth daily., Disp: 30 tablet, Rfl: 0   TRESIBA FLEXTOUCH 200 UNIT/ML FlexTouch Pen, Inject 45 Units into the skin daily., Disp: , Rfl:    vitamin B-12 (CYANOCOBALAMIN) 100 MCG tablet, Take 100 mcg by mouth daily., Disp: , Rfl:   Past Medical History: Past Medical History:  Diagnosis Date   Abnormal mammogram    Allergic rhinitis due to pollen 08/01/2019   Arthritis 01/06/2019   osteoarthritis of both knees   BMI 28.0-28.9,adult 08/01/2019   Cystocele, unspecified (CODE)    Diabetes mellitus    GERD (gastroesophageal reflux disease)    Heart murmur  Hyperlipemia, mixed 12/14/2018   Hypertension    Hypothyroidism    Stress incontinence, female    Thyroid disease    Type 2 diabetes mellitus, without long-term current use of insulin ([place]) 01/06/2019   Vitamin D deficiency 01/06/2019    Tobacco Use: Social History   Tobacco Use  Smoking Status Former   Packs/day: 0.50   Years: 15.00   Pack years: 7.50   Types: Cigarettes   Quit date: 11/2020   Years since quitting: 0.5  Smokeless Tobacco Never    Labs: Recent Review Scientist, physiological     Labs for ITP Cardiac and Pulmonary Rehab Latest Ref Rng & Units 12/06/2020   Cholestrol 0 - 200 mg/dL 201(H)   LDLCALC 0 - 99 mg/dL 134(H)    HDL >40 mg/dL 51   Trlycerides <150 mg/dL 78   Hemoglobin A1c 4.8 - 5.6 % 9.4(H)        Exercise Target Goals: Exercise Program Goal: Individual exercise prescription set using results from initial 6 min walk test and THRR while considering  patients activity barriers and safety.   Exercise Prescription Goal: Initial exercise prescription builds to 30-45 minutes a day of aerobic activity, 2-3 days per week.  Home exercise guidelines will be given to patient during program as part of exercise prescription that the participant will acknowledge.   Education: Aerobic Exercise: - Group verbal and visual presentation on the components of exercise prescription. Introduces F.I.T.T principle from ACSM for exercise prescriptions.  Reviews F.I.T.T. principles of aerobic exercise including progression. Written material given at graduation.   Education: Resistance Exercise: - Group verbal and visual presentation on the components of exercise prescription. Introduces F.I.T.T principle from ACSM for exercise prescriptions  Reviews F.I.T.T. principles of resistance exercise including progression. Written material given at graduation. Flowsheet Row Cardiac Rehab from 05/14/2021 in [facility] Cardiac and Pulmonary Rehab  Date 02/26/21  Educator as  Instruction Review Code 1- Verbalizes Understanding        Education: Exercise & Equipment Safety: - Individual verbal instruction and demonstration of equipment use and safety with use of the equipment. Flowsheet Row Cardiac Rehab from 05/14/2021 in [facility] Cardiac and Pulmonary Rehab  Education need identified 01/30/21  Date 01/30/21  Educator AS  Instruction Review Code 1- Verbalizes Understanding       Education: Exercise Physiology & General Exercise Guidelines: - Group verbal and written instruction with models to review the exercise physiology of the cardiovascular system and associated critical values. Provides general exercise guidelines with  specific guidelines to those with heart or lung disease.    Education: Flexibility, Balance, Mind/Body Relaxation: - Group verbal and visual presentation with interactive activity on the components of exercise prescription. Introduces F.I.T.T principle from ACSM for exercise prescriptions. Reviews F.I.T.T. principles of flexibility and balance exercise training including progression. Also discusses the mind body connection.  Reviews various relaxation techniques to help reduce and manage stress (i.e. Deep breathing, progressive muscle relaxation, and visualization). Balance handout provided to take home. Written material given at graduation.   Activity Barriers & Risk Stratification:  Activity Barriers & Cardiac Risk Stratification - 01/17/21 1505       Activity Barriers & Cardiac Risk Stratification   Activity Barriers Joint Problems   rotator cuff injury, injections in both knees and shoulders   Cardiac Risk Stratification Moderate             6 Minute Walk:  6 Minute Walk     Row Name 01/30/21 1646  6 Minute Walk   Phase Initial     Distance 915 feet     Walk Time 6 minutes     # of Rest Breaks 0     MPH 1.73     METS 3.2     RPE 13     Perceived Dyspnea  1     VO2 Peak 11.3     Symptoms No     Resting HR 62 bpm     Resting BP 138/62     Resting Oxygen Saturation  99 %     Exercise Oxygen Saturation  during 6 min walk 98 %     Max Ex. HR 86 bpm     Max Ex. BP 186/66     2 Minute Post BP 136/64              Oxygen Initial Assessment:   Oxygen Re-Evaluation:   Oxygen Discharge (Final Oxygen Re-Evaluation):   Initial Exercise Prescription:  Initial Exercise Prescription - 01/30/21 1600       Date of Initial Exercise RX and Referring Provider   Date 01/30/21    Referring Provider Patwardhan      Treadmill   MPH 1.7    Grade 1.5    Minutes 15    METs 2.6      Recumbant Bike   Level 1    RPM 60    Minutes 15    METs 3      NuStep    Level 1    SPM 80    Minutes 15    METs 3      REL-XR   Level 1    Speed 50    Minutes 15    METs 3      Track   Laps 38    Minutes 15      Prescription Details   Frequency (times per week) 3    Duration Progress to 30 minutes of continuous aerobic without signs/symptoms of physical distress      Intensity   THRR 40-80% of Max Heartrate 95-139    Ratings of Perceived Exertion 11-13    Perceived Dyspnea 0-4      Resistance Training   Training Prescription Yes    Weight 3lb    Reps 10-15             Perform Capillary Blood Glucose checks as needed.  Exercise Prescription Changes:   Exercise Prescription Changes     Row Name 01/30/21 1600 02/17/21 1500 03/04/21 1400 03/31/21 1100 04/15/21 0700     Response to Exercise   Blood Pressure (Admit) 138/62 128/56 142/62 118/58 128/64   Blood Pressure (Exercise) 186/66 134/64 160/60 -- --   Blood Pressure (Exit) 136/64 132/82 128/56 104/58 112/60   Heart Rate (Admit) 62 bpm 83 bpm 70 bpm 73 bpm 77 bpm   Heart Rate (Exercise) 86 bpm 106 bpm 110 bpm 87 bpm 99 bpm   Heart Rate (Exit) 60 bpm 82 bpm 87 bpm 74 bpm 80 bpm   Oxygen Saturation (Admit) 99 % -- -- -- --   Oxygen Saturation (Exercise) 98 % -- -- -- --   Rating of Perceived Exertion (Exercise) 13 15 15 15 13    Perceived Dyspnea (Exercise) 1 -- -- -- --   Symptoms none -- none none none   Comments -- third day exercise -- -- --   Duration -- Progress to 30 minutes of  aerobic without signs/symptoms of physical distress Continue  with 30 min of aerobic exercise without signs/symptoms of physical distress. Continue with 30 min of aerobic exercise without signs/symptoms of physical distress. Continue with 30 min of aerobic exercise without signs/symptoms of physical distress.   Intensity -- THRR unchanged THRR unchanged THRR unchanged THRR unchanged     Progression   Progression -- Continue to progress workloads to maintain intensity without signs/symptoms of  physical distress. Continue to progress workloads to maintain intensity without signs/symptoms of physical distress. Continue to progress workloads to maintain intensity without signs/symptoms of physical distress. Continue to progress workloads to maintain intensity without signs/symptoms of physical distress.   Average METs -- 2.55 2.76 2.53 2.86     Resistance Training   Training Prescription -- Yes Yes Yes Yes   Weight -- 3 lb 3 lb 4 lb 4 lb   Reps -- 10-15 10-15 10-15 10-15     Interval Training   Interval Training -- -- No No No     Treadmill   MPH -- 1.3 1.7 1.7 1.7   Grade -- 1 3.5 3.5 2   Minutes -- 15 15 15 15    METs -- 2.17 3.12 3.12 2.77     Recumbant Bike   Level -- -- 1 1 2    Minutes -- -- 15 15 15    METs -- -- 2.39 2.94 2.95     NuStep   Level -- -- 1 -- --   Minutes -- -- 15 -- --     REL-XR   Level -- -- -- 4 --   Minutes -- -- -- 15 --   METs -- -- -- 1.4 --     Track   Laps -- -- -- 21 --   Minutes -- -- -- 15 --     Oxygen   Maintain Oxygen Saturation -- -- 88% or higher 88% or higher 88% or higher    Row Name 04/29/21 1500 05/12/21 1700 05/28/21 0900         Response to Exercise   Blood Pressure (Admit) 110/58 132/70 118/62     Blood Pressure (Exit) 110/60 128/58 114/60     Heart Rate (Admit) 82 bpm 76 bpm 81 bpm     Heart Rate (Exercise) 103 bpm 99 bpm 110 bpm     Heart Rate (Exit) 89 bpm 85 bpm 85 bpm     Rating of Perceived Exertion (Exercise) 13 12 13      Symptoms none none none     Duration Continue with 30 min of aerobic exercise without signs/symptoms of physical distress. Continue with 30 min of aerobic exercise without signs/symptoms of physical distress. Continue with 30 min of aerobic exercise without signs/symptoms of physical distress.     Intensity THRR unchanged THRR unchanged THRR unchanged       Progression   Progression Continue to progress workloads to maintain intensity without signs/symptoms of physical distress. Continue  to progress workloads to maintain intensity without signs/symptoms of physical distress. Continue to progress workloads to maintain intensity without signs/symptoms of physical distress.     Average METs 2.45 2.25 2.15       Resistance Training   Training Prescription Yes Yes Yes     Weight 4 lb 4 lb 4 lb     Reps 10-15 10-15 10-15       Interval Training   Interval Training No No No       Treadmill   MPH 1.7 1.7 1.7     Grade 0.5 0 0  Minutes '15 15 15     '$ METs 2.3 15 2.3       NuStep   Level 4 -- --     Minutes 15 -- --     METs 1.8 -- --       REL-XR   Level -- -- 4     Minutes -- -- 15     METs -- -- 2       Oxygen   Maintain Oxygen Saturation -- -- 88% or higher              Exercise Comments:   Exercise Comments     Row Name 02/03/21 1425           Exercise Comments First full day of exercise!  Patient was oriented to gym and equipment including functions, settings, policies, and procedures.  Patient's individual exercise prescription and treatment plan were reviewed.  All starting workloads were established based on the results of the 6 minute walk test done at initial orientation visit.  The plan for exercise progression was also introduced and progression will be customized based on patient's performance and goals.                Exercise Goals and Review:   Exercise Goals     Row Name 01/30/21 1649             Exercise Goals   Increase Physical Activity Yes       Intervention Provide advice, education, support and counseling about physical activity/exercise needs.;Develop an individualized exercise prescription for aerobic and resistive training based on initial evaluation findings, risk stratification, comorbidities and participant's personal goals.       Expected Outcomes Short Term: Attend rehab on a regular basis to increase amount of physical activity.;Long Term: Add in home exercise to make exercise part of routine and to increase  amount of physical activity.;Long Term: Exercising regularly at least 3-5 days a week.       Increase Strength and Stamina Yes       Intervention Provide advice, education, support and counseling about physical activity/exercise needs.;Develop an individualized exercise prescription for aerobic and resistive training based on initial evaluation findings, risk stratification, comorbidities and participant's personal goals.       Expected Outcomes Short Term: Increase workloads from initial exercise prescription for resistance, speed, and METs.;Short Term: Perform resistance training exercises routinely during rehab and add in resistance training at home;Long Term: Improve cardiorespiratory fitness, muscular endurance and strength as measured by increased METs and functional capacity (6MWT)       Able to understand and use rate of perceived exertion (RPE) scale Yes       Intervention Provide education and explanation on how to use RPE scale       Expected Outcomes Short Term: Able to use RPE daily in rehab to express subjective intensity level;Long Term:  Able to use RPE to guide intensity level when exercising independently       Able to understand and use Dyspnea scale Yes       Intervention Provide education and explanation on how to use Dyspnea scale       Expected Outcomes Short Term: Able to use Dyspnea scale daily in rehab to express subjective sense of shortness of breath during exertion;Long Term: Able to use Dyspnea scale to guide intensity level when exercising independently       Knowledge and understanding of Target Heart Rate Range (THRR) Yes  Intervention Provide education and explanation of THRR including how the numbers were predicted and where they are located for reference       Expected Outcomes Short Term: Able to state/look up THRR;Short Term: Able to use daily as guideline for intensity in rehab;Long Term: Able to use THRR to govern intensity when exercising independently        Able to check pulse independently Yes       Intervention Provide education and demonstration on how to check pulse in carotid and radial arteries.;Review the importance of being able to check your own pulse for safety during independent exercise       Expected Outcomes Short Term: Able to explain why pulse checking is important during independent exercise;Long Term: Able to check pulse independently and accurately       Understanding of Exercise Prescription Yes       Intervention Provide education, explanation, and written materials on patient's individual exercise prescription       Expected Outcomes Short Term: Able to explain program exercise prescription;Long Term: Able to explain home exercise prescription to exercise independently                Exercise Goals Re-Evaluation :  Exercise Goals Re-Evaluation     [place] Name 02/03/21 1425 02/17/21 1504 03/04/21 1430 03/17/21 1420 03/31/21 1117     Exercise Goal Re-Evaluation   Exercise Goals Review Increase Physical Activity;Able to understand and use rate of perceived exertion (RPE) scale;Knowledge and understanding of Target Heart Rate Range (THRR);Understanding of Exercise Prescription;Increase Strength and Stamina;Able to understand and use Dyspnea scale;Able to check pulse independently Increase Physical Activity;Increase Strength and Stamina Increase Physical Activity;Increase Strength and Stamina;Understanding of Exercise Prescription Increase Physical Activity;Increase Strength and Stamina Increase Physical Activity;Increase Strength and Stamina   Comments Reviewed RPE and dyspnea scales, THR and program prescription with pt today.  Pt voiced understanding and was given a copy of goals to take home. Laureen is working on building up to prescribed speed on TM.  She reaches THR range.  We will continue to monitor progress. Elisabeth is doing well in rehab. She is now up to 3.5% grade on the treadmill and 11 watts on the bike.  We will continue  to monitor her progress. [name] is doing some sit ups outside program sessions.  EP staff will review home exercise Emony has had a hard time tolerating exercise due to MSK limitations. Staff has tried to increase loads for patient, however, patient declined. However, she did increase to 4 lbs for handweights. XR level was up to 4 but she recently has been working at level 1. It would be beneficial for her to maintain a consistent level with each exercise. Will continue to monitor and progress as appropriate and as tolerated.   Expected Outcomes Short: Use RPE daily to regulate intensity. Long: Follow program prescription in THR. Short:  continue to build stamina on TM Long: reach prescribed speed Short: Continue to increase workloads Long: Continue to improve stamina Short: add one day in addition to program sessions Long: maintain exercise on her own Short: Maintain consistent loads and increase gradually Long: Increase overall MET level    Row Name 04/07/21 1405 04/15/21 0726 04/15/21 0727 04/29/21 1555 05/12/21 1734     Exercise Goal Re-Evaluation   Exercise Goals Review Increase Physical Activity;Increase Strength and Stamina;Understanding of Exercise Prescription Increase Physical Activity;Increase Strength and Stamina -- Increase Physical Activity;Increase Strength and Stamina;Understanding of Exercise Prescription Increase Physical Activity;Increase Strength and Stamina  Comments Carys is interested in joining the [place] when she is done with the program. She is able to walk at home for exercise currently. She is doing well in the program. She is wanted increase her energy levels. -- Madason attends consistently and reaches her THR range.Staff will encourage her to continue adding incline to TM. Only attended once since last review with holidays. Bexley has been getting back to consistent attendance.  She reaches THR range most sessions and has moved to 4 lb for strength work.  We will continue to  monitor progress.   Expected Outcomes Short: add more exercise at home and join the [place]. Long: workout at the [facility] independently. -- Short: add incline to TM Long: complete HT program short: return to regular attendance Long: Continue to improve stamina. Short: attend consistently Long: improve average MET level    Row Name 05/28/21 0951             Exercise Goal Re-Evaluation   Exercise Goals Review Increase Physical Activity;Increase Strength and Stamina       Comments Natha had missed a couple sessions the past several weeks and continues with sporadic attendance. She has dropped her incline on the TM and does not wish to put it back on. RPES are staying in appropriate range. She is due for her post 6MWT and we expect improvement. Will continue to monitor.       Expected Outcomes Short: Complete post 6MWT Long: Continue to build up overall strength and stamina                Discharge Exercise Prescription (Final Exercise Prescription Changes):  Exercise Prescription Changes - 05/28/21 0900       Response to Exercise   Blood Pressure (Admit) 118/62    Blood Pressure (Exit) 114/60    Heart Rate (Admit) 81 bpm    Heart Rate (Exercise) 110 bpm    Heart Rate (Exit) 85 bpm    Rating of Perceived Exertion (Exercise) 13    Symptoms none    Duration Continue with 30 min of aerobic exercise without signs/symptoms of physical distress.    Intensity THRR unchanged      Progression   Progression Continue to progress workloads to maintain intensity without signs/symptoms of physical distress.    Average METs 2.15      Resistance Training   Training Prescription Yes    Weight 4 lb    Reps 10-15      Interval Training   Interval Training No      Treadmill   MPH 1.7    Grade 0    Minutes 15    METs 2.3      REL-XR   Level 4    Minutes 15    METs 2      Oxygen   Maintain Oxygen Saturation 88% or higher             Nutrition:  Target Goals: Understanding  of nutrition guidelines, daily intake of sodium '1500mg'$ , cholesterol '200mg'$ , calories 30% from fat and 7% or less from saturated fats, daily to have 5 or more servings of fruits and vegetables.  Education: All About Nutrition: -Group instruction provided by verbal, written material, interactive activities, discussions, models, and posters to present general guidelines for heart healthy nutrition including fat, fiber, MyPlate, the role of sodium in heart healthy nutrition, utilization of the nutrition label, and utilization of this knowledge for meal planning. Follow up email sent as well. Written material  given at graduation. Flowsheet Row Cardiac Rehab from 05/14/2021 in [facility] Cardiac and Pulmonary Rehab  Education need identified 01/30/21       Biometrics:  Pre Biometrics - 01/30/21 1650       Pre Biometrics   Height $Remov'5\' 11"'[id number]$  (1.803 m)    Weight 184 lb 1.6 oz (83.5 kg)    BMI (Calculated) 25.69    Single Leg Stand 8.9 seconds              Nutrition Therapy Plan and Nutrition Goals:  Nutrition Therapy & Goals - 03/24/21 1500       Nutrition Therapy   RD appointment deferred Yes   pt would not like to meet with dietitian at this time, will continue to follow up     Personal Nutrition Goals   Comments pt would not like to meet with dietitian at this time, will continue to follow up             Nutrition Assessments:  MEDIFICTS Score Key: ?70 Need to make dietary changes  40-70 Heart Healthy Diet ? 40 Therapeutic Level Cholesterol Diet  Flowsheet Row Cardiac Rehab from 01/30/2021 in [facility] Cardiac and Pulmonary Rehab  Picture Your Plate Total Score on Admission 51      Picture Your Plate Scores: <09 Unhealthy dietary pattern with much room for improvement. 41-50 Dietary pattern unlikely to meet recommendations for good health and room for improvement. 51-60 More healthful dietary pattern, with some room for improvement.  >60 Healthy dietary pattern, although there  may be some specific behaviors that could be improved.    Nutrition Goals Re-Evaluation:  Nutrition Goals Re-Evaluation     Row Name 03/17/21 1421 04/07/21 1411 05/01/21 1417         Goals   Current Weight -- 182 lb (82.6 kg) 185 lb (83.9 kg)     Nutrition Goal -- -- Maintain Weight     Comment Jalie has not met with RD yet Kissa does not want to meet with the dietician. She has no cencerns with her nutritional health. Alliya does not want to meet with the dietician. She has no cencerns with her nutritional health. She wants to maintain her weight.     Expected Outcome ST: schedule and meet with RD Short: inform staff of nutritions changes. Long: make healthy eating choices. Short: inform staff of nutritions changes. Long: make healthy eating choices.              Nutrition Goals Discharge (Final Nutrition Goals Re-Evaluation):  Nutrition Goals Re-Evaluation - 05/01/21 1417       Goals   Current Weight 185 lb (83.9 kg)    Nutrition Goal Maintain Weight    Comment Jerah does not want to meet with the dietician. She has no cencerns with her nutritional health. She wants to maintain her weight.    Expected Outcome Short: inform staff of nutritions changes. Long: make healthy eating choices.             Psychosocial: Target Goals: Acknowledge presence or absence of significant depression and/or stress, maximize coping skills, provide positive support system. Participant is able to verbalize types and ability to use techniques and skills needed for reducing stress and depression.   Education: Stress, Anxiety, and Depression - Group verbal and visual presentation to define topics covered.  Reviews how body is impacted by stress, anxiety, and depression.  Also discusses healthy ways to reduce stress and to treat/manage anxiety and depression.  Written material  given at graduation. Flowsheet Row Cardiac Rehab from 05/14/2021 in [facility] Cardiac and Pul[facility] Rehab  Date 04/09/21   Educator AS  Instruction Review Code 1- Verbalizes Understanding       Education: Sleep Hygiene -Provides group verbal and written instruction about how sleep can affect your health.  Define sleep hygiene, discuss sleep cycles and impact of sleep habits. Review good sleep hygiene tips.    Initial Review & Psychosocial Screening:  Initial Psych Review & Screening - 01/17/21 1509       Initial Review   Current issues with Current Stress Concerns    Source of Stress Concerns Occupation      [place]? Yes   daughter     Barriers   Psychosocial barriers to participate in program There are no identifiable barriers or psychosocial needs.;The patient should benefit from training in stress management and relaxation.      Screening Interventions   Interventions Encouraged to exercise;Provide feedback about the scores to participant;To provide support and resources with identified psychosocial needs    Expected Outcomes Short Term goal: Utilizing psychosocial counselor, staff and physician to assist with identification of specific Stressors or current issues interfering with healing process. Setting desired goal for each stressor or current issue identified.;Long Term Goal: Stressors or current issues are controlled or eliminated.;Short Term goal: Identification and review with participant of any Quality of Life or Depression concerns found by scoring the questionnaire.;Long Term goal: The participant improves quality of Life and PHQ9 Scores as seen by post scores and/or verbalization of changes             Quality of Life Scores:   Quality of Life - 01/30/21 1653       Quality of Life   Select Quality of Life      Quality of Life Scores   Health/Function Pre 17.92 %    Socioeconomic Pre 15.38 %    Psych/Spiritual Pre 19.71 %    Family Pre 22.6 %    GLOBAL Pre 18.39 %            Scores of 19 and below usually indicate a poorer quality of life  in these areas.  A difference of  2-3 points is a clinically meaningful difference.  A difference of 2-3 points in the total score of the Quality of Life Index has been associated with significant improvement in overall quality of life, self-image, physical symptoms, and general health in studies assessing change in quality of life.  PHQ-9: Recent Review Flowsheet Data     Depression screen [facility] 2/9 03/17/2021 03/03/2021 01/30/2021   Decreased Interest 0 2 1   Down, Depressed, Hopeless 0 0 1   PHQ - 2 Score 0 2 2   Altered sleeping $RemoveBeforeDE'1 3 2   'TQuJJNrXJbqDMgc$ Tired, decreased energy $RemoveBeforeDE'1 2 3   'KvbcCNkPdgrpgvt$ Change in appetite $RemoveBef'1 3 1   'RwNmbSluzB$ Feeling bad or failure about yourself  0 0 0   Trouble concentrating 0 1 -   Moving slowly or fidgety/restless 0 0 2   Suicidal thoughts 0 0 0   PHQ-9 Score $RemoveBef'3 11 10   'CnyBlviymR$ Difficult doing work/chores Somewhat difficult Somewhat difficult Somewhat difficult      Interpretation of Total Score  Total Score Depression Severity:  1-4 = Minimal depression, 5-9 = Mild depression, 10-14 = Moderate depression, 15-19 = Moderately severe depression, 20-27 = Severe depression   Psychosocial Evaluation and Intervention:  Psychosocial Evaluation - 03/17/21 1417  Psychosocial Evaluation & Interventions   Comments Review PHQ 9 with patient.  Score improved to a 3.  She feels stopping ozempic helped her feel better.  She has gone back to work part time.  She feels good with the part time hours.  She will let staff know if she needs info on vocational rehab    Expected Outcomes Short:attend rehab for the exercise and stress education Long: maintain good self care habits             Psychosocial Re-Evaluation:  Psychosocial Re-Evaluation     [place] Name 03/03/21 [place] 03/17/21 1413 04/07/21 1408 05/01/21 1412       Psychosocial Re-Evaluation   Current issues with Current Stress Concerns Current Stress Concerns None Identified None Identified    Comments Reviewed patient health questionnaire (PHQ-9)  with patient for follow up. Previously, patients score indicated signs/symptoms of depression.  Reviewed to see if patient is improving symptom wise while in program.  Score declined and patient states that it is because she has not been able to sleep well and she does not feel good after she eats a bit of food. -- Patient reports no issues with their current mental states, sleep, stress, depression or anxiety. Will follow up with patient in a few weeks for any changes. Patient reports no issues with their current mental states, sleep, stress, depression or anxiety. Will follow up with patient in a few weeks for any changes. She states that she has been lacking on her exercise over the holidatys and is ready to get back into the routine.    Expected Outcomes Short: Continue to work toward an improvement in [place] scores by attending [facility] regularly. Long: Continue to improve stress and depression coping skills by talking with staff and attendind [facility] regularly and work toward a positive mental state. -- Short: Continue to exercise regularly to support mental health and notify staff of any changes. Long: maintain mental health and well being through teaching of rehab or prescribed medications independently. Short: maintain lower stress levels. Long: continue to workout to keep stress at a minimum.    Interventions Encouraged to attend Cardiac Rehabilitation for the exercise -- Encouraged to attend Cardiac Rehabilitation for the exercise Encouraged to attend Cardiac Rehabilitation for the exercise    Continue Psychosocial Services  Follow up required by staff -- Follow up required by staff Follow up required by staff             Psychosocial Discharge (Final Psychosocial Re-Evaluation):  Psychosocial Re-Evaluation - 05/01/21 1412       Psychosocial Re-Evaluation   Current issues with None Identified    Comments Patient reports no issues with their current mental states, sleep, stress,  depression or anxiety. Will follow up with patient in a few weeks for any changes. She states that she has been lacking on her exercise over the holidatys and is ready to get back into the routine.    Expected Outcomes Short: maintain lower stress levels. Long: continue to workout to keep stress at a minimum.    Interventions Encouraged to attend Cardiac Rehabilitation for the exercise    Continue Psychosocial Services  Follow up required by staff             Vocational Rehabilitation: Provide vocational rehab assistance to qualifying candidates.   Vocational Rehab Evaluation & Intervention:  Vocational Rehab - 01/17/21 1508       Initial Vocational Rehab Evaluation & Intervention   Assessment shows need for  Vocational Rehabilitation No             Education: Education Goals: Education classes will be provided on a variety of topics geared toward better understanding of heart health and risk factor modification. Participant will state understanding/return demonstration of topics presented as noted by education test scores.  Learning Barriers/Preferences:  Learning Barriers/Preferences - 01/17/21 1508       Learning Barriers/Preferences   Learning Barriers None    Learning Preferences None             General Cardiac Education Topics:  AED/CPR: - Group verbal and written instruction with the use of models to demonstrate the basic use of the AED with the basic ABC's of resuscitation.   Anatomy and Cardiac Procedures: - Group verbal and visual presentation and models provide information about basic cardiac anatomy and function. Reviews the testing methods done to diagnose heart disease and the outcomes of the test results. Describes the treatment choices: Medical Management, Angioplasty, or Coronary Bypass Surgery for treating various heart conditions including Myocardial Infarction, Angina, Valve Disease, and Cardiac Arrhythmias.  Written material given at  graduation. Flowsheet Row Cardiac Rehab from 05/14/2021 in [facility] Cardiac and Pulmonary Rehab  Date 02/26/21  Educator sb  Instruction Review Code 1- Verbalizes Understanding       Medication Safety: - Group verbal and visual instruction to review commonly prescribed medications for heart and lung disease. Reviews the medication, class of the drug, and side effects. Includes the steps to properly store meds and maintain the prescription regimen.  Written material given at graduation. Flowsheet Row Cardiac Rehab from 05/14/2021 in [facility] Cardiac and Pulmonary Rehab  Date 05/14/21  Educator [facility]  Instruction Review Code 1- Verbalizes Understanding       Intimacy: - Group verbal instruction through game format to discuss how heart and lung disease can affect sexual intimacy. Written material given at graduation..   Know Your Numbers and Heart Failure: - Group verbal and visual instruction to discuss disease risk factors for cardiac and pulmonary disease and treatment options.  Reviews associated critical values for Overweight/Obesity, Hypertension, Cholesterol, and Diabetes.  Discusses basics of heart failure: signs/symptoms and treatments.  Introduces Heart Failure Zone chart for action plan for heart failure.  Written material given at graduation. Flowsheet Row Cardiac Rehab from 05/14/2021 in [facility] Card[facility] Rehab  Date 03/26/21  Educator [facility]  Instruction Review Code 1- Verbalizes Understanding       Infection Prevention: - Provides verbal and written material to individual with discussion of infection control including proper hand washing and proper equipment cleaning during exercise session. Flowsheet Row Cardiac Rehab from 05/14/2021 in [facility] Cardiac and Pulmonary Rehab  Date 01/30/21  Educator AS  Instruction Review Code 1- Verbalizes Understanding       Falls Prevention: - Provides verbal and written material to individual with discussion of falls prevention and  safety. Flowsheet Row Cardiac Rehab from 05/14/2021 in [facility] Cardiac and Pulmonary Rehab  Date 01/30/21  Educator AS  Instruction Review Code 1- Verbalizes Understanding       Other: -Provides group and verbal instruction on various topics (see comments)   Knowledge Questionnaire Score:  Knowledge Questionnaire Score - 01/30/21 1732       Knowledge Questionnaire Score   Pre Score 18/26             Core Components/Risk Factors/Patient Goals at Admission:  Personal Goals and Risk Factors at Admission - 01/30/21 1652  Core Components/Risk Factors/Patient Goals on Admission   Tobacco Cessation Yes    Number of packs per day Quit August 2022    Intervention Offer self-teaching materials, assist with locating and accessing local/national Quit Smoking programs, and support quit date choice.;Assist the participant in steps to quit. Provide individualized education and counseling about committing to Tobacco Cessation, relapse prevention, and pharmacological support that can be provided by physician.    Expected Outcomes Short Term: Will demonstrate readiness to quit, by selecting a quit date.;Short Term: Will quit all tobacco product use, adhering to prevention of relapse plan.;Long Term: Complete abstinence from all tobacco products for at least 12 months from quit date.    Diabetes Yes    Intervention Provide education about signs/symptoms and action to take for hypo/hyperglycemia.;Provide education about proper nutrition, including hydration, and aerobic/resistive exercise prescription along with prescribed medications to achieve blood glucose in normal ranges: Fasting glucose 65-99 mg/dL    Expected Outcomes Short Term: Participant verbalizes understanding of the signs/symptoms and immediate care of hyper/hypoglycemia, proper foot care and importance of medication, aerobic/resistive exercise and nutrition plan for blood glucose control.;Long Term: Attainment of HbA1C < 7%.     Hypertension Yes    Intervention Provide education on lifestyle modifcations including regular physical activity/exercise, weight management, moderate sodium restriction and increased consumption of fresh fruit, vegetables, and low fat dairy, alcohol moderation, and smoking cessation.;Monitor prescription use compliance.    Expected Outcomes Short Term: Continued assessment and intervention until BP is < 140/52mm HG in hypertensive participants. < 130/62mm HG in hypertensive participants with diabetes, heart failure or chronic kidney disease.;Long Term: Maintenance of blood pressure at goal levels.    Lipids Yes    Intervention Provide education and support for participant on nutrition & aerobic/resistive exercise along with prescribed medications to achieve LDL '70mg'$ , HDL >$Remo'40mg'AyEXC$ .    Expected Outcomes Short Term: Participant states understanding of desired cholesterol values and is compliant with medications prescribed. Participant is following exercise prescription and nutrition guidelines.;Long Term: Cholesterol controlled with medications as prescribed, with individualized exercise RX and with personalized nutrition plan. Value goals: LDL < $Rem'70mg'kpFQ$ , HDL > 40 mg.             Education:Diabetes - Individual verbal and written instruction to review signs/symptoms of diabetes, desired ranges of glucose level fasting, after meals and with exercise. Acknowledge that pre and post exercise glucose checks will be done for 3 sessions at entry of program. [place] from 01/17/2021 in [facility] Cardiac and Pulmonary Rehab  Date 01/17/21  Educator [facility]  Instruction Review Code 1- Verbalizes Understanding       Core Components/Risk Factors/Patient Goals Review:   Goals and Risk Factor Review     Row Name 03/17/21 1407 04/07/21 1409 05/01/21 1413         Core Components/Risk Factors/Patient Goals Review   Personal Goals Review Tobacco Cessation;Diabetes;Hypertension Tobacco  Cessation;Hypertension Tobacco Cessation     Review Amra remains tobacco free! She still has cravings.  She says she can go on until her cravings pass.  She does check her BG in the morning - usually around 106.  She had a dizzy spell and didnt feel good at work last week.  She didnt check BG or BP as she was at work.  This was right after she had been sick.  She hasnt had any more episodes.  We reviewed the importance of keeping an eye on BP and BG.  She reports taking all medications as directed.  Wilna states she has not touched any tobacco products sinc eshe quit smoking in August. Her blood pressure cuff at home reads high. Informed her to bring it in and we can check with our readings. Raeann states that she slipped up a little with her smoking. She is still trying to quit and wants to quit. Reviewed Quit smoking packet with patient and gave copy to go home.     Expected Outcomes Short:  continue to monitor risk factors and maintain tobacco cessation Long: manage risk factors long term Short: bring in blood pressure cuff. Long: maintain adequate blood pressure readings independently. Short: cut back on smoking more. Long: Quick smoking.              Core Components/Risk Factors/Patient Goals at Discharge (Final Review):   Goals and Risk Factor Review - 05/01/21 1413       Core Components/Risk Factors/Patient Goals Review   Personal Goals Review Tobacco Cessation    Review Lysandra states that she slipped up a little with her smoking. She is still trying to quit and wants to quit. Reviewed Quit smoking packet with patient and gave copy to go home.    Expected Outcomes Short: cut back on smoking more. Long: Quick smoking.             ITP Comments:  ITP Comments     Row Name 01/17/21 1517 01/30/21 1733 02/03/21 1425 02/26/21 0706 03/26/21 0653   ITP Comments Initial telephone orientation completed. Diagnosis can be found in [facility] 8/11. EP orientation scheduled for Thursday 10/6 at 3pm.  Completed 6MWT and gym orientation. Initial ITP created and sent for review to Dr. Emily Filbert, Medical Director. First full day of exercise!  Patient was oriented to gym and equipment including functions, settings, policies, and procedures.  Patient's individual exercise prescription and treatment plan were reviewed.  All starting workloads were established based on the results of the 6 minute walk test done at initial orientation visit.  The plan for exercise progression was also introduced and progression will be customized based on patient's performance and goals. 30 Day review completed. Medical Director ITP review done, changes made as directed, and signed approval by Medical Director. 30 Day review completed. Medical Director ITP review done, changes made as directed, and signed approval by Medical Director.    [place] Name 04/23/21 0709 04/29/21 1555 05/21/21 0851 06/05/21 1047 06/12/21 1427   ITP Comments 30 Day review completed. Medical Director ITP review done, changes made as directed, and signed approval by Medical Director. Patient has been out since 04/14/21 with holidays. 30 Day review completed. Medical Director ITP review done, changes made as directed, and signed approval by Medical Director. Attempted to call patient regarding cardiac rehab and left voicemail asking for callback. Patient has not attended since 1/26. Spoke with patient, she is going back to work and we will discharge at this time.            Comments: Discharge ITP

## 2021-07-03 ENCOUNTER — Other Ambulatory Visit: Payer: Self-pay

## 2021-07-03 ENCOUNTER — Ambulatory Visit: Payer: BC Managed Care – PPO | Admitting: Cardiology

## 2021-07-03 ENCOUNTER — Encounter: Payer: Self-pay | Admitting: Cardiology

## 2021-07-03 VITALS — BP 154/65 | HR 76 | Temp 98.0°F | Resp 16 | Ht 71.0 in | Wt 180.0 lb

## 2021-07-03 DIAGNOSIS — I5020 Unspecified systolic (congestive) heart failure: Secondary | ICD-10-CM

## 2021-07-03 DIAGNOSIS — I10 Essential (primary) hypertension: Secondary | ICD-10-CM

## 2021-07-03 DIAGNOSIS — E1165 Type 2 diabetes mellitus with hyperglycemia: Secondary | ICD-10-CM

## 2021-07-03 DIAGNOSIS — I25118 Atherosclerotic heart disease of native coronary artery with other forms of angina pectoris: Secondary | ICD-10-CM

## 2021-07-03 DIAGNOSIS — Z006 Encounter for examination for normal comparison and control in clinical research program: Secondary | ICD-10-CM | POA: Insufficient documentation

## 2021-07-03 NOTE — Progress Notes (Signed)
Patient referred by [name], NP for coronary artery disease  Subjective:   Monica Bell, female    DOB: December 23, 1960, 61 y.o.   MRN: [id number]  Chief Complaint  Patient presents with   Coronary Artery Disease   Follow-up    4 week    HPI  61 year old African-American female with hypertension, hyperlipidemia, type 2 diabetes mellitus, tobacco dependence, CAD, HFrEF-now recovered EF, GERD  Patient did not have significant ischemia on her stress testing in 9/20222. Echocardiogram showed normalized EF in 02/2021.  However, she continues to have retrosternal burning sensation with certain activities such as using a mop.  This has persisted in spite of medical management.    Reviewed recent external labs with the patient, details below.   Current Outpatient Medications:    BRILINTA 90 MG TABS tablet, TAKE 1 TABLET BY MOUTH TWICE DAILY, Disp: 60 tablet, Rfl: 11   Calcium Carbonate-Vitamin D (CALTRATE 600+D PO), Take 1 tablet by mouth daily., Disp: , Rfl:    cetirizine (ZYRTEC) 10 MG tablet, Take 10 mg by mouth daily., Disp: , Rfl:    cholecalciferol (VITAMIN D3) 25 MCG (1000 UNIT) tablet, Take 1,000 Units by mouth daily., Disp: , Rfl:    esomeprazole (NEXIUM) 20 MG capsule, Take 1 capsule (20 mg total) by mouth daily at 12 noon., Disp: 30 capsule, Rfl: 3   GNP ASPIRIN LOW DOSE 81 MG EC tablet, TAKE 1 TABLET BY MOUTH ONCE DAILY. SWALLOW WHOLE., Disp: 90 tablet, Rfl: 1   insulin aspart protamine- aspart (NOVOLOG MIX 70/30) (70-30) 100 UNIT/ML injection, Inject into the skin., Disp: , Rfl:    Investigational - Study Medication, EMPACT-MI Study  Empagliflozin /or Placebo, Disp: , Rfl:    isosorbide-hydrALAZINE (BIDIL) 20-37.5 MG tablet, Take 1 tablet by mouth 3 (three) times daily., Disp: 90 tablet, Rfl: 3   ivabradine (CORLANOR) 5 MG TABS tablet, Take 1 tablet (5 mg total) by mouth 2 (two) times daily with a meal., Disp: 60 tablet, Rfl: 2   levothyroxine (SYNTHROID) 137 MCG  tablet, Take 137 mcg by mouth daily., Disp: , Rfl:    losartan (COZAAR) 50 MG tablet, Take 1 tablet by mouth daily., Disp: , Rfl:    magnesium oxide (MAG-OX) 400 MG tablet, Take 400 mg by mouth daily., Disp: , Rfl:    metFORMIN (GLUCOPHAGE) 1000 MG tablet, Take 1,000 mg by mouth 2 (two) times daily with a meal., Disp: , Rfl:    Multiple Minerals-Vitamins (CAL-MAG-ZINC-D PO), Take by mouth., Disp: , Rfl:    ranolazine (RANEXA) 500 MG 12 hr tablet, Take 1 tablet (500 mg total) by mouth 2 (two) times daily., Disp: 60 tablet, Rfl: 3   TRESIBA FLEXTOUCH 200 UNIT/ML FlexTouch Pen, Inject 45 Units into the skin daily., Disp: , Rfl:    vitamin B-12 (CYANOCOBALAMIN) 100 MCG tablet, Take 100 mcg by mouth daily., Disp: , Rfl:    amLODipine (NORVASC) 5 MG tablet, Take 1 tablet (5 mg total) by mouth daily., Disp: 30 tablet, Rfl: 3   metoprolol succinate (TOPROL-XL) 50 MG 24 hr tablet, Take 1 tablet (50 mg total) by mouth daily. Take with or immediately following a meal., Disp: 30 tablet, Rfl: 0   nitroGLYCERIN (NITROSTAT) 0.4 MG SL tablet, Place 1 tablet (0.4 mg total) under the tongue every 5 (five) minutes as needed for chest pain., Disp: 30 tablet, Rfl: 3   rosuvastatin (CRESTOR) 40 MG tablet, Take 1 tablet (40 mg total) by mouth daily., Disp: 30 tablet, Rfl:  0  Cardiovascular and other pertinent studies:  EKG 06/05/2021: Sinus rhythm 80 bpm   Echocardiogram 03/18/2021:  Normal LV systolic function with visual EF 60-65%. Left ventricle cavity  is normal in size. Moderate left ventricular hypertrophy. Normal global  wall motion. Indeterminate diastolic filling pattern, indeterminate LAP.  No significant valvular disease.  Small pericardial effusion. There is no hemodynamic significance.  Compared to study 12/06/2020: LVEF improved from 40-45% to 60-65%  otherwise no significant change.   Lexiscan Sestamibi stress test 01/15/2021: Lexiscan nuclear stress test performed using 1-day protocol. Stress EKG  is non-diagnostic, as this is pharmacological stress test. In addition, stress EKG at 78% MPHR showed sinus tachycardia, 1 mm inferolateral downsloping ST depression, that normalize 1 min into recovery.  Normal myocardial perfusion. Stress LVEF 35% with moderate global decrease in myocardial thickening and wall motion. High risk study due to low stress LVEF.  Coronary intervention 12/06/2020: LM: Normal LAD: No significant disease Lcx: Prox thrombotic 95% stenosis, 60% ostial OM1 stenosis        Mid Lcx occlusion, likely chronic RCA: Mid 60% disease. Right-to-left collaterals to occluded Lcx   Successful percutaneous coronary intervention prox Lcx-OM1        PTCA and stent placement 3.0 X 30 mm Onyx Frontier drug-eluting  stent, post dilatation with 3.25X8 and 3.5X15 mm [place] balloons up to 20 atm   Distal embolization into small branches of OM1. Aggrastat bolus and infusion started.  Echocardiogram 12/06/2020:  1. LVEF is depressed with hypokinesis of the distal anterior, basal  inferior and apical walls and severe hypokinesis of the distal lateral  walls. LVEF 40 to 45%. Left ventricular diastolic parameters are  consistent with Grade I diastolic dysfunction  (impaired relaxation). Elevated left atrial pressure.   2. Right ventricular systolic function is normal. The right ventricular  size is normal.   3. Soft tissue density epicardially, seen most prominently inferior to  RV. May represent adipose tissue, cannot exclude inflammation with  consolidation. No old images to compare . a small pericardial effusion is  present.   4. The mitral valve is normal in structure. No evidence of mitral valve  regurgitation.   5. The aortic valve is normal in structure. Aortic valve regurgitation is  not visualized.   6. The inferior vena cava is normal in size with greater than 50%  respiratory variability, suggesting right atrial pressure of 3 mmHg.    Recent labs: 06/27/2021: Glucose 318,  BUN/Cr 9/1.26. EGFR 49. Na/K 138/4.5. Rest of the CMP normal H/H 10.6/34.9. MCV 85. Platelets 350 HbA1C 11.5% Chol 75, TG 82, HDL 344, LDL 15 TSH 0.03 very low Free T4 1.6 normal  01/02/2021: (After discontiuing Entresto) Glucose 269, BUN/Cr 9/1.26. EGFR 49. Na/K 139/4.1.   12/24/2020: (On Entresto 24-26 mg bid) Glucose 243, BUN/Cr 11/1.82. EGFR 32. Na/K 132/3.8.   12/16/2020: (On Entresto 49-51 mg bid) Glucose 195, BUN/Cr 11/1.47. EGFR 41. Na/K 139/4.1.   12/07/2020: (On Entresto 24-26 mg bid) Glucose 180, BUN/Cr 10/0.87. EGFR >60. Na/K 136/4.4.  H/H 11.7/35.7. MCV 87. Platelets 370 HbA1C 9.4% Chol 201, TG 78, HDL 51, LDL 134 TSH N/A  Results for RASHAE, ROTHER (MRN [id number]) as of 12/10/2020 11:10  Ref. Range 12/06/2020 01:39 12/06/2020 05:03 12/06/2020 07:46 12/06/2020 10:07  Troponin I (High Sensitivity) Latest Ref Range: <18 ng/L 149 (HH) 396 (HH) 734 (HH) 1,050 (HH)     Review of Systems  Constitutional: Positive for malaise/fatigue.  Cardiovascular:  Positive for chest pain (Different from her angina  at the time of MI). Negative for dyspnea on exertion, leg swelling, palpitations and syncope.       Vitals:   07/03/21 1411  BP: (!) 154/65  Pulse: 76  Resp: 16  Temp: 98 F (36.7 C)  SpO2: 96%     Body mass index is 25.1 kg/m. Filed Weights   07/03/21 1411  Weight: 180 lb (81.6 kg)     Objective:   Physical Exam Vitals and nursing note reviewed.  Constitutional:      General: She is not in acute distress. Neck:     Vascular: No JVD.  Cardiovascular:     Rate and Rhythm: Normal rate and regular rhythm.     Pulses: Decreased pulses.     Heart sounds: Normal heart sounds. No murmur heard. Pulmonary:     Effort: Pulmonary effort is normal.     Breath sounds: Normal breath sounds. No wheezing or rales.  Musculoskeletal:     Right lower leg: No edema.     Left lower leg: No edema.        Assessment & Recommendations:   61 year old  African-American female with hypertension, hyperlipidemia, type 2 diabetes mellitus, tobacco dependence, CAD, HFrEF-now recovered EF, GERD   CAD: NSTEMI 11/2020. Culprit: thrombotic 95% stenosis Prox Lcx into large OM1 Non-culprit: Mid Lcx chronic occlusion with R-to-L grade 3 collaterals                     Mid RCA 60% stenosis Recommend DAPT with Aspirin and Brilinta till 11/2021 Metoprolol succinate 50 mg daily Continue Crestor and Praluent. LDL down to 15. However, I am extremely concerned regarding her uncontrolled diabetes with A1c now up to 11%.  This could very well have contributed to progression of her CAD, now with refractory anginal symptoms.   I am suspecting progression of previously moderate disease in mid RCA, which is also collateralizing her chronically occluded left circumflex. Given her refractory angina symptoms, in spite of previously normal stress test, I recommend coronary angiography and possible intervention.  Risks, benefits, alternate options discussed the patient.  Patient would like to proceed with heart catheterization, but wants to wait till May or June.  I defer the timing to the patient. Continue DAPT with Aspirin and Brilinta at least till 11/2021. Continue current angi anginal therapy-including amlodipine 5 mg, metoprolol succinate 50 mg, BiDil. Unable to use Ranexa due to cost.  HFrEF: Clinically euvolumic.  LVEF recovered. Entresto discontinued due to AKI Continue metoprolol succinate 50 mg daily Continue Bidil 20-37.5 mg tid. Encourage hydration. Continue Corlanor 5 mg bid. Enrolled in EMPACT-MI clinical trial (Empaglifozin vs placebo in post MI heart failure patients).  Consider using non-SGLT2i for diabetes management.   Hypertension: Controlled   Mixed hyperlipidemia: LDL down to 15 on Praluent.    Type 2 DM: Uncontrolled. Defer management to primary team.  This remains extremely critical to management of her coronary artery disease.  Strongly  recommend close follow-up with PCP. Enrolled in EMPACT-MI clinical trial (Empaglifozin vs placebo in post MI heart failure patients).  Consider using non-SGLT2i for diabetes management.    F/u in 4 weeks   Nigel Mormon, MD Pager: [phone] Office: [phone]

## 2021-07-15 ENCOUNTER — Ambulatory Visit (HOSPITAL_COMMUNITY)
Admission: RE | Admit: 2021-07-15 | Discharge: 2021-07-16 | Disposition: A | Discharge status: Home / Self Care | Payer: BC Managed Care – PPO | Attending: Cardiology | Admitting: Cardiology

## 2021-07-15 ENCOUNTER — Other Ambulatory Visit: Payer: Self-pay

## 2021-07-15 ENCOUNTER — Encounter (HOSPITAL_COMMUNITY): Payer: Self-pay | Admitting: Cardiology

## 2021-07-15 ENCOUNTER — Encounter (HOSPITAL_COMMUNITY)
Admission: RE | Disposition: A | Discharge status: Home / Self Care | Payer: Self-pay | Source: Home / Self Care | Attending: Cardiology

## 2021-07-15 DIAGNOSIS — I110 Hypertensive heart disease with heart failure: Secondary | ICD-10-CM | POA: Diagnosis not present

## 2021-07-15 DIAGNOSIS — I5020 Unspecified systolic (congestive) heart failure: Secondary | ICD-10-CM | POA: Diagnosis not present

## 2021-07-15 DIAGNOSIS — F17210 Nicotine dependence, cigarettes, uncomplicated: Secondary | ICD-10-CM | POA: Diagnosis not present

## 2021-07-15 DIAGNOSIS — K219 Gastro-esophageal reflux disease without esophagitis: Secondary | ICD-10-CM | POA: Insufficient documentation

## 2021-07-15 DIAGNOSIS — E782 Mixed hyperlipidemia: Secondary | ICD-10-CM | POA: Insufficient documentation

## 2021-07-15 DIAGNOSIS — I2582 Chronic total occlusion of coronary artery: Secondary | ICD-10-CM | POA: Diagnosis not present

## 2021-07-15 DIAGNOSIS — Z955 Presence of coronary angioplasty implant and graft: Secondary | ICD-10-CM

## 2021-07-15 DIAGNOSIS — I2510 Atherosclerotic heart disease of native coronary artery without angina pectoris: Secondary | ICD-10-CM | POA: Diagnosis present

## 2021-07-15 DIAGNOSIS — E119 Type 2 diabetes mellitus without complications: Secondary | ICD-10-CM | POA: Insufficient documentation

## 2021-07-15 DIAGNOSIS — I25112 Atherosclerosic heart disease of native coronary artery with refractory angina pectoris: Secondary | ICD-10-CM | POA: Diagnosis present

## 2021-07-15 DIAGNOSIS — I202 Refractory angina pectoris: Secondary | ICD-10-CM

## 2021-07-15 DIAGNOSIS — I25118 Atherosclerotic heart disease of native coronary artery with other forms of angina pectoris: Secondary | ICD-10-CM | POA: Diagnosis present

## 2021-07-15 DIAGNOSIS — Z9861 Coronary angioplasty status: Secondary | ICD-10-CM

## 2021-07-15 HISTORY — PX: CORONARY STENT INTERVENTION: CATH118234

## 2021-07-15 HISTORY — PX: LEFT HEART CATH AND CORONARY ANGIOGRAPHY: CATH118249

## 2021-07-15 HISTORY — PX: CORONARY BALLOON ANGIOPLASTY: CATH118233

## 2021-07-15 HISTORY — PX: CORONARY ULTRASOUND/IVUS: CATH118244

## 2021-07-15 LAB — BASIC METABOLIC PANEL
Anion gap: 7 (ref 5–15)
BUN: 14 mg/dL (ref 6–20)
CO2: 22 mmol/L (ref 22–32)
Calcium: 9.1 mg/dL (ref 8.9–10.3)
Chloride: 107 mmol/L (ref 98–111)
Creatinine, Ser: 1.33 mg/dL — ABNORMAL HIGH (ref 0.44–1.00)
GFR, Estimated: 46 mL/min — ABNORMAL LOW (ref >60)
Glucose, Bld: 194 mg/dL — ABNORMAL HIGH (ref 70–99)
Potassium: 4 mmol/L (ref 3.5–5.1)
Sodium: 136 mmol/L (ref 135–145)

## 2021-07-15 LAB — POCT ACTIVATED CLOTTING TIME
Activated Clotting Time: 233 seconds
Activated Clotting Time: 263 seconds
Activated Clotting Time: 444 seconds
Activated Clotting Time: 269 seconds
Activated Clotting Time: 311 seconds
Activated Clotting Time: 293 seconds
Activated Clotting Time: 323 seconds

## 2021-07-15 LAB — CBC
HCT: 32.8 % — ABNORMAL LOW (ref 36.0–46.0)
Hemoglobin: 10.6 g/dL — ABNORMAL LOW (ref 12.0–15.0)
MCH: 26.6 pg (ref 26.0–34.0)
MCHC: 32.3 g/dL (ref 30.0–36.0)
MCV: 82.2 fL (ref 80.0–100.0)
Platelets: 357 10*3/uL (ref 150–400)
RBC: 3.99 MIL/uL (ref 3.87–5.11)
RDW: 15.6 % — ABNORMAL HIGH (ref 11.5–15.5)
WBC: 13.6 10*3/uL — ABNORMAL HIGH (ref 4.0–10.5)
nRBC: 0 % (ref 0.0–0.2)

## 2021-07-15 LAB — GLUCOSE, CAPILLARY
Glucose-Capillary: 188 mg/dL — ABNORMAL HIGH (ref 70–99)
Glucose-Capillary: 122 mg/dL — ABNORMAL HIGH (ref 70–99)
Glucose-Capillary: 215 mg/dL — ABNORMAL HIGH (ref 70–99)

## 2021-07-15 LAB — POCT I-STAT, CHEM 8
BUN: 17 mg/dL (ref 6–20)
Calcium, Ion: 1.22 mmol/L (ref 1.15–1.40)
Chloride: 106 mmol/L (ref 98–111)
Creatinine, Ser: 1.3 mg/dL — ABNORMAL HIGH (ref 0.44–1.00)
Glucose, Bld: 191 mg/dL — ABNORMAL HIGH (ref 70–99)
HCT: 34 % — ABNORMAL LOW (ref 36.0–46.0)
Hemoglobin: 11.6 g/dL — ABNORMAL LOW (ref 12.0–15.0)
Potassium: 4.1 mmol/L (ref 3.5–5.1)
Sodium: 138 mmol/L (ref 135–145)
TCO2: 24 mmol/L (ref 22–32)

## 2021-07-15 LAB — HEMOGLOBIN A1C
Hgb A1c MFr Bld: 11.1 % — ABNORMAL HIGH (ref 4.8–5.6)
Mean Plasma Glucose: 271.87 mg/dL

## 2021-07-15 SURGERY — LEFT HEART CATH AND CORONARY ANGIOGRAPHY
Anesthesia: LOCAL

## 2021-07-15 MED ORDER — FENTANYL CITRATE (PF) 100 MCG/2ML IJ SOLN
INTRAMUSCULAR | Status: DC | PRN
  Administered 2021-07-15: 25 ug via INTRAVENOUS
  Administered 2021-07-15: 50 ug via INTRAVENOUS
  Administered 2021-07-15 (×2): 25 ug via INTRAVENOUS

## 2021-07-15 MED ORDER — LABETALOL HCL 5 MG/ML IV SOLN: 10.0000 mg | INTRAVENOUS | Status: AC | PRN

## 2021-07-15 MED ORDER — SODIUM CHLORIDE 0.9 % WEIGHT BASED INFUSION
3.0000 mL/kg/h | INTRAVENOUS | Status: DC
  Administered 2021-07-15: 3 mL/kg/h via INTRAVENOUS

## 2021-07-15 MED ORDER — MIDAZOLAM HCL 2 MG/2ML IJ SOLN
INTRAMUSCULAR | Status: DC | PRN
  Administered 2021-07-15 (×4): 1 mg via INTRAVENOUS

## 2021-07-15 MED ORDER — LIDOCAINE HCL (PF) 1 % IJ SOLN
INTRAMUSCULAR | Status: AC
  Filled 2021-07-15: qty 30

## 2021-07-15 MED ORDER — ASPIRIN 81 MG PO CHEW: 81.0000 mg | CHEWABLE_TABLET | ORAL | Status: DC

## 2021-07-15 MED ORDER — ASPIRIN EC 81 MG PO TBEC
81.0000 mg | DELAYED_RELEASE_TABLET | Freq: Every day | ORAL | Status: DC
  Administered 2021-07-16: 81 mg via ORAL
  Filled 2021-07-15: qty 1

## 2021-07-15 MED ORDER — TICAGRELOR 90 MG PO TABS
90.0000 mg | ORAL_TABLET | Freq: Two times a day (BID) | ORAL | Status: DC
Start: 2021-07-15 — End: 2021-07-16
  Administered 2021-07-15 – 2021-07-16 (×2): 90 mg via ORAL
  Filled 2021-07-15 (×2): qty 1

## 2021-07-15 MED ORDER — SODIUM CHLORIDE 0.9 % IV BOLUS
INTRAVENOUS | Status: AC | PRN
  Administered 2021-07-15: 250 mL via INTRAVENOUS

## 2021-07-15 MED ORDER — IVABRADINE HCL 5 MG PO TABS
5.0000 mg | ORAL_TABLET | Freq: Two times a day (BID) | ORAL | Status: DC
  Administered 2021-07-16: 5 mg via ORAL
  Filled 2021-07-15 (×2): qty 1

## 2021-07-15 MED ORDER — INSULIN ASPART 100 UNIT/ML IJ SOLN: 3.0000 [IU] | Freq: Three times a day (TID) | INTRAMUSCULAR | Status: DC

## 2021-07-15 MED ORDER — FENTANYL CITRATE (PF) 100 MCG/2ML IJ SOLN
INTRAMUSCULAR | Status: AC
  Filled 2021-07-15: qty 2

## 2021-07-15 MED ORDER — LIDOCAINE HCL (PF) 1 % IJ SOLN
INTRAMUSCULAR | Status: DC | PRN
Start: 2021-07-15 — End: 2021-07-15
  Administered 2021-07-15: 2 mL

## 2021-07-15 MED ORDER — ONDANSETRON HCL 4 MG/2ML IJ SOLN
4.0000 mg | Freq: Four times a day (QID) | INTRAMUSCULAR | Status: DC | PRN
Start: 2021-07-15 — End: 2021-07-16

## 2021-07-15 MED ORDER — HEPARIN SODIUM (PORCINE) 5000 UNIT/ML IJ SOLN: 5000.0000 [IU] | Freq: Three times a day (TID) | INTRAMUSCULAR | Status: DC

## 2021-07-15 MED ORDER — SODIUM CHLORIDE 0.9% FLUSH: 3.0000 mL | INTRAVENOUS | Status: DC | PRN

## 2021-07-15 MED ORDER — SODIUM CHLORIDE 0.9 % WEIGHT BASED INFUSION: 1.0000 mL/kg/h | INTRAVENOUS | Status: DC

## 2021-07-15 MED ORDER — PANTOPRAZOLE SODIUM 40 MG PO TBEC
40.0000 mg | DELAYED_RELEASE_TABLET | Freq: Every day | ORAL | Status: DC
  Administered 2021-07-15 – 2021-07-16 (×2): 40 mg via ORAL
  Filled 2021-07-15 (×2): qty 1

## 2021-07-15 MED ORDER — SODIUM CHLORIDE 0.9% FLUSH
3.0000 mL | Freq: Two times a day (BID) | INTRAVENOUS | Status: DC
  Administered 2021-07-15 – 2021-07-16 (×2): 3 mL via INTRAVENOUS

## 2021-07-15 MED ORDER — ACETAMINOPHEN 325 MG PO TABS: 650.0000 mg | ORAL_TABLET | ORAL | Status: DC | PRN

## 2021-07-15 MED ORDER — SODIUM CHLORIDE 0.9% FLUSH: 3.0000 mL | Freq: Two times a day (BID) | INTRAVENOUS | Status: DC

## 2021-07-15 MED ORDER — SODIUM CHLORIDE 0.9 % WEIGHT BASED INFUSION: 3.0000 mL/kg/h | INTRAVENOUS | Status: DC

## 2021-07-15 MED ORDER — NITROGLYCERIN 1 MG/10 ML FOR IR/CATH LAB
INTRA_ARTERIAL | Status: DC | PRN
  Administered 2021-07-15: 100 ug via INTRACORONARY

## 2021-07-15 MED ORDER — HEPARIN (PORCINE) IN NACL 1000-0.9 UT/500ML-% IV SOLN
INTRAVENOUS | Status: AC
  Filled 2021-07-15: qty 500

## 2021-07-15 MED ORDER — HEPARIN SODIUM (PORCINE) 1000 UNIT/ML IJ SOLN
INTRAMUSCULAR | Status: DC | PRN
  Administered 2021-07-15 (×2): 2000 [IU] via INTRAVENOUS
  Administered 2021-07-15: 4000 [IU] via INTRAVENOUS
  Administered 2021-07-15 (×2): 3000 [IU] via INTRAVENOUS
  Administered 2021-07-15: 4000 [IU] via INTRAVENOUS
  Administered 2021-07-15: 3000 [IU] via INTRAVENOUS

## 2021-07-15 MED ORDER — ROSUVASTATIN CALCIUM 20 MG PO TABS
40.0000 mg | ORAL_TABLET | Freq: Every day | ORAL | Status: DC
Start: 2021-07-16 — End: 2021-07-16
  Administered 2021-07-16: 40 mg via ORAL
  Filled 2021-07-15: qty 2

## 2021-07-15 MED ORDER — AMLODIPINE BESYLATE 5 MG PO TABS
5.0000 mg | ORAL_TABLET | Freq: Every day | ORAL | Status: DC
  Administered 2021-07-16: 5 mg via ORAL
  Filled 2021-07-15: qty 1

## 2021-07-15 MED ORDER — HEPARIN SODIUM (PORCINE) 1000 UNIT/ML IJ SOLN
INTRAMUSCULAR | Status: AC
  Filled 2021-07-15: qty 10

## 2021-07-15 MED ORDER — SODIUM CHLORIDE 0.9 % IV SOLN
250.0000 mL | INTRAVENOUS | Status: DC | PRN
Start: 2021-07-15 — End: 2021-07-16

## 2021-07-15 MED ORDER — IOHEXOL 350 MG/ML SOLN
INTRAVENOUS | Status: DC | PRN
  Administered 2021-07-15: 160 mL

## 2021-07-15 MED ORDER — MIDAZOLAM HCL 2 MG/2ML IJ SOLN
INTRAMUSCULAR | Status: AC
  Filled 2021-07-15: qty 2

## 2021-07-15 MED ORDER — INSULIN ASPART 100 UNIT/ML IJ SOLN
0.0000 [IU] | Freq: Three times a day (TID) | INTRAMUSCULAR | Status: DC
  Administered 2021-07-15: 1 [IU] via SUBCUTANEOUS
  Administered 2021-07-16: 2 [IU] via SUBCUTANEOUS

## 2021-07-15 MED ORDER — NITROGLYCERIN 1 MG/10 ML FOR IR/CATH LAB
INTRA_ARTERIAL | Status: AC
  Filled 2021-07-15: qty 10

## 2021-07-15 MED ORDER — VERAPAMIL HCL 2.5 MG/ML IV SOLN
INTRAVENOUS | Status: AC
  Filled 2021-07-15: qty 2

## 2021-07-15 MED ORDER — LEVOTHYROXINE SODIUM 137 MCG PO TABS
137.0000 ug | ORAL_TABLET | Freq: Every day | ORAL | Status: DC
  Administered 2021-07-15 – 2021-07-16 (×2): 137 ug via ORAL
  Filled 2021-07-15 (×2): qty 1

## 2021-07-15 MED ORDER — SODIUM CHLORIDE 0.9 % IV SOLN: INTRAVENOUS | Status: AC

## 2021-07-15 MED ORDER — VITAMIN B-12 1000 MCG PO TABS
1000.0000 ug | ORAL_TABLET | Freq: Every day | ORAL | Status: DC
  Administered 2021-07-15 – 2021-07-16 (×2): 1000 ug via ORAL
  Filled 2021-07-15 (×2): qty 1

## 2021-07-15 MED ORDER — SODIUM CHLORIDE 0.9 % IV SOLN: 250.0000 mL | INTRAVENOUS | Status: DC | PRN

## 2021-07-15 MED ORDER — METOPROLOL SUCCINATE ER 50 MG PO TB24
50.0000 mg | ORAL_TABLET | Freq: Every day | ORAL | Status: DC
Start: 2021-07-16 — End: 2021-07-16
  Administered 2021-07-16: 50 mg via ORAL
  Filled 2021-07-15: qty 1

## 2021-07-15 MED ORDER — ISOSORB DINITRATE-HYDRALAZINE 20-37.5 MG PO TABS
1.0000 | ORAL_TABLET | Freq: Three times a day (TID) | ORAL | Status: DC
  Administered 2021-07-16: 1 via ORAL
  Filled 2021-07-15: qty 1

## 2021-07-15 MED ORDER — INSULIN DETEMIR 100 UNIT/ML ~~LOC~~ SOLN
50.0000 [IU] | Freq: Every day | SUBCUTANEOUS | Status: DC
  Administered 2021-07-15 – 2021-07-16 (×2): 50 [IU] via SUBCUTANEOUS
  Filled 2021-07-15 (×2): qty 0.5

## 2021-07-15 MED ORDER — HYDRALAZINE HCL 20 MG/ML IJ SOLN: 10.0000 mg | INTRAMUSCULAR | Status: AC | PRN

## 2021-07-15 MED ORDER — VERAPAMIL HCL 2.5 MG/ML IV SOLN
INTRAVENOUS | Status: DC | PRN
  Administered 2021-07-15: 10 mL via INTRA_ARTERIAL

## 2021-07-15 MED ORDER — NITROGLYCERIN 0.4 MG SL SUBL: 0.4000 mg | SUBLINGUAL_TABLET | SUBLINGUAL | Status: DC | PRN

## 2021-07-15 MED ORDER — INSULIN ASPART 100 UNIT/ML IJ SOLN
10.0000 [IU] | Freq: Three times a day (TID) | INTRAMUSCULAR | Status: DC
  Administered 2021-07-16: 10 [IU] via SUBCUTANEOUS

## 2021-07-15 MED ORDER — HEPARIN (PORCINE) IN NACL 1000-0.9 UT/500ML-% IV SOLN
INTRAVENOUS | Status: DC | PRN
  Administered 2021-07-15 (×3): 500 mL

## 2021-07-15 SURGICAL SUPPLY — 38 items
BAG MDU5 PLUS (MISCELLANEOUS) ×1 IMPLANT
BALLN EUPHORA RX 3.0X10 (BALLOONS) ×2
BALLN SAPPHIRE 1.5X10 (BALLOONS) ×2
BALLN SAPPHIRE 2.0X15 (BALLOONS) ×2
BALLN SAPPHIRE 3.0X12 (BALLOONS) ×2
BALLN SAPPHIRE ~~LOC~~ 3.0X8 (BALLOONS) ×3 IMPLANT
BALLN ~~LOC~~ EMERGE MR 2.75X15 (BALLOONS) ×2
BALLN ~~LOC~~ EUPHORA RX 2.75X12 (BALLOONS) ×2
BALLN ~~LOC~~ EUPHORA RX 3.0X8 (BALLOONS) ×2
BALLOON EUPHORA RX 3.0X10 (BALLOONS) IMPLANT
BALLOON SAPPHIRE 1.5X10 (BALLOONS) IMPLANT
BALLOON SAPPHIRE 2.0X15 (BALLOONS) IMPLANT
BALLOON SAPPHIRE 3.0X12 (BALLOONS) IMPLANT
BALLOON ~~LOC~~ EMERGE MR 2.75X15 (BALLOONS) IMPLANT
BALLOON ~~LOC~~ EUPHORA RX 2.75X12 (BALLOONS) IMPLANT
BALLOON ~~LOC~~ EUPHORA RX 3.0X8 (BALLOONS) IMPLANT
CATH INFINITI 5 FR JL3.5 (CATHETERS) ×1 IMPLANT
CATH INFINITI JR4 5F (CATHETERS) ×1 IMPLANT
CATH LAUNCHER 6FR EBU 3 (CATHETERS) ×1 IMPLANT
CATH OPTICROSS HD (CATHETERS) ×1 IMPLANT
CATH TELEPORT (CATHETERS) ×1 IMPLANT
DEVICE RAD COMP TR BAND LRG (VASCULAR PRODUCTS) ×1 IMPLANT
GLIDESHEATH SLEND A-KIT 6F 22G (SHEATH) ×1 IMPLANT
GUIDEWIRE INQWIRE 1.5J.035X260 (WIRE) IMPLANT
INQWIRE 1.5J .035X260CM (WIRE) ×2
KIT ENCORE 26 ADVANTAGE (KITS) ×2 IMPLANT
KIT ESSENTIALS PG (KITS) ×1 IMPLANT
KIT HEART LEFT (KITS) ×2 IMPLANT
PACK CARDIAC CATHETERIZATION (CUSTOM PROCEDURE TRAY) ×2 IMPLANT
SLED PULL BACK IVUS (MISCELLANEOUS) ×1 IMPLANT
STENT ONYX FRONTIER 2.25X26 (Permanent Stent) ×1 IMPLANT
TRANSDUCER W/STOPCOCK (MISCELLANEOUS) ×2 IMPLANT
TUBING CIL FLEX 10 FLL-RA (TUBING) ×2 IMPLANT
VALVE COPILOT STAT (MISCELLANEOUS) ×1 IMPLANT
WIRE ASAHI FIELDER XT 300CM (WIRE) ×1 IMPLANT
WIRE ASAHI PROWATER 180CM (WIRE) ×1 IMPLANT
WIRE ASAHI PROWATER 300CM (WIRE) ×1 IMPLANT
WIRE COUGAR XT STRL 190CM (WIRE) ×1 IMPLANT

## 2021-07-15 NOTE — Interval H&P Note (Signed)
History and Physical Interval Note: ? ?07/15/2021 ?9:51 AM ? ?Monica Bell  has presented today for surgery, with the diagnosis of cad.  The various methods of treatment have been discussed with the patient and family. After consideration of risks, benefits and other options for treatment, the patient has consented to  Procedure(s): ?LEFT HEART CATH AND CORONARY ANGIOGRAPHY (N/A) as a surgical intervention.  The patient's history has been reviewed, patient examined, no change in status, stable for surgery.  I have reviewed the patient's chart and labs.  Questions were answered to the patient's satisfaction.   ? ?2016/2017 Appropriate Use Criteria for Coronary Revascularization ?Symptom Status: Ischemic Symptoms  ?Non-invasive Testing: Not done  ?If no or indeterminate stress test, FFR/iFR results in all diseased vessels: Not done  ?Diabetes Mellitus: Yes  ?S/P CABG: No  ?Antianginal therapy (number of long-acting drugs): >=2  ?Patient undergoing renal transplant: No  ?Patient undergoing percutaneous valve procedure: No  ?1 Vessel Disease PCI CABG  ?No proximal LAD involvement, No proximal left dominant LCX involvement Not rated Not rated  ?Proximal left dominant LCX involvement Not rated Not rated  ?Proximal LAD involvement Not rated Not rated  ?2 Vessel Disease  ?No proximal LAD involvement Not rated Not rated  ?Proximal LAD involvement Not rated Not rated  ?3 Vessel Disease  ?Low disease complexity (e.g., focal stenoses, SYNTAX <=22) Not rated Not rated  ?Intermediate or high disease complexity (e.g., SYNTAX >=23) Not rated Not rated  ?Left Main Disease  ?Isolated LMCA disease: ostial or midshaft A (7); Indication 24 A (9); Indication 24  ?Isolated LMCA disease: bifurcation involvement M (6); Indication 25 A (9); Indication 25  ?LMCA ostial or midshaft, concurrent low disease burden multivessel disease (e.g., 1-2 additional focal stenoses, SYNTAX <=22) A (7); Indication 26 A (9); Indication 26  ?LMCA ostial  or midshaft, concurrent intermediate or high disease burden multivessel disease (e.g., 1-2 additional bifurcation stenoses, long stenoses, SYNTAX >=23) M (4); Indication 27 A (9); Indication 27  ?LMCA bifurcation involvement, concurrent low disease burden multivessel disease (e.g., 1-2 additional focal stenoses, SYNTAX <=22) M (6); Indication 28 A (9); Indication 28  ?LMCA bifurcation involvement, concurrent intermediate or high disease burden multivessel disease (e.g., 1-2 additional bifurcation stenoses, long stenoses, SYNTAX >=23) R (3); Indication 29 A (9); Indication 29  ?Notes: ?A indicates appropriate. M indicates may be appropriate. R indicates rarely appropriate. Number in parentheses is median score for that indication. Reclassify indicates number of functionally diseased vessels should be decreased given negative FFR/iFR. Re-evaluate the scenario interpreting any FFR/iFR negative vessel as being not significantly stenosed. ?Disease means involved vessel provides flow to a sufficient amount of myocardium to be clinically important. ?If FFR testing indicates a vessel is not significant, that vessel should not be considered diseased (and the patient should be reclassified with respect to extent of functionally significant disease). ?Proximal LAD + proximal left dominant LCX is considered 3 vessel CAD ?2 Vessel CAD with FFR/iFR abnormal in only 1 but not both is considered 1 vessel CAD ?Disease complexity includes occlusion, bifurcation, trifurcation, ostial, >20 mm, tortuosity, calcification, thrombus ?LMCA disease is >=50% by angiography, MLD <2.8 mm, MLA <6 mm2; MLA 6-7.5 mm2 requires further physiologic ?See Table B for risk stratification based on noninvasive testing ?Journal of the [employer] of Cardiology Mar 2017, 23391; DOI: 10.1016/j.jacc.2017.02.001 ?[email or web address] ?This App ? 2018 by the Society for Cardiovascular  Angiography and Interventions ? ? ? ?[place] ? ? ?

## 2021-07-15 NOTE — TOC Progression Note (Signed)
Transition of Care (TOC) - Progression Note  ? ? ?Patient Details  ?Name: Monica Bell ?MRN: [id number] ?Date of Birth: Jun 17, 1960 ? ?Transition of Care (TOC) CM/SW Contact  ?Zenon Mayo, RN ?Phone Number: ?07/15/2021, 4:36 PM ? ?Clinical Narrative:    ?From home, presents with CAD, s/p cath with stent, will be on brilinta.  TOC will continue to follow for dc needs. Pta was on brilinata.  ? ? ?  ?  ? ?Expected Discharge Plan and Services ?  ?  ?  ?  ?  ?                ?  ?  ?  ?  ?  ?  ?  ?  ?  ?  ? ? ?Social Determinants of Health (SDOH) Interventions ?  ? ?Readmission Risk Interventions ?No flowsheet data found. ? ?

## 2021-07-15 NOTE — H&P (Signed)
OV 07/03/2021 copied for documentation ? ? ? ? ?Patient referred by [name], NP for coronary artery disease ? ?Subjective:  ? ?Monica Bell, female    DOB: Jan 06, 1961, 61 y.o.   MRN: [id number] ? ?Chief Complaint  ?Patient presents with  ? Coronary Artery Disease  ? Follow-up  ?  4 week  ? ? ?HPI ? ?61 year old African-American female with hypertension, hyperlipidemia, type 2 diabetes mellitus, tobacco dependence, CAD, HFrEF-now recovered EF, GERD ? ?Patient did not have significant ischemia on her stress testing in 9/20222. Echocardiogram showed normalized EF in 02/2021.  However, she continues to have retrosternal burning sensation with certain activities such as using a mop.  This has persisted in spite of medical management.   ? ?Reviewed recent external labs with the patient, details below. ? ? ?Current Outpatient Medications:  ?  BRILINTA 90 MG TABS tablet, TAKE 1 TABLET BY MOUTH TWICE DAILY, Disp: 60 tablet, Rfl: 11 ?  Calcium Carbonate-Vitamin D (CALTRATE 600+D PO), Take 1 tablet by mouth daily., Disp: , Rfl:  ?  cetirizine (ZYRTEC) 10 MG tablet, Take 10 mg by mouth daily., Disp: , Rfl:  ?  cholecalciferol (VITAMIN D3) 25 MCG (1000 UNIT) tablet, Take 1,000 Units by mouth daily., Disp: , Rfl:  ?  esomeprazole (NEXIUM) 20 MG capsule, Take 1 capsule (20 mg total) by mouth daily at 12 noon., Disp: 30 capsule, Rfl: 3 ?  GNP ASPIRIN LOW DOSE 81 MG EC tablet, TAKE 1 TABLET BY MOUTH ONCE DAILY. SWALLOW WHOLE., Disp: 90 tablet, Rfl: 1 ?  insulin aspart protamine- aspart (NOVOLOG MIX 70/30) (70-30) 100 UNIT/ML injection, Inject into the skin., Disp: , Rfl:  ?  Investigational - Study Medication, EMPACT-MI Study  Empagliflozin /or Placebo, Disp: , Rfl:  ?  isosorbide-hydrALAZINE (BIDIL) 20-37.5 MG tablet, Take 1 tablet by mouth 3 (three) times daily., Disp: 90 tablet, Rfl: 3 ?  ivabradine (CORLANOR) 5 MG TABS tablet, Take 1 tablet (5 mg total) by mouth 2 (two) times daily with a meal., Disp: 60 tablet,  Rfl: 2 ?  levothyroxine (SYNTHROID) 137 MCG tablet, Take 137 mcg by mouth daily., Disp: , Rfl:  ?  losartan (COZAAR) 50 MG tablet, Take 1 tablet by mouth daily., Disp: , Rfl:  ?  magnesium oxide (MAG-OX) 400 MG tablet, Take 400 mg by mouth daily., Disp: , Rfl:  ?  metFORMIN (GLUCOPHAGE) 1000 MG tablet, Take 1,000 mg by mouth 2 (two) times daily with a meal., Disp: , Rfl:  ?  Multiple Minerals-Vitamins (CAL-MAG-ZINC-D PO), Take by mouth., Disp: , Rfl:  ?  ranolazine (RANEXA) 500 MG 12 hr tablet, Take 1 tablet (500 mg total) by mouth 2 (two) times daily., Disp: 60 tablet, Rfl: 3 ?  TRESIBA FLEXTOUCH 200 UNIT/ML FlexTouch Pen, Inject 45 Units into the skin daily., Disp: , Rfl:  ?  vitamin B-12 (CYANOCOBALAMIN) 100 MCG tablet, Take 100 mcg by mouth daily., Disp: , Rfl:  ?  amLODipine (NORVASC) 5 MG tablet, Take 1 tablet (5 mg total) by mouth daily., Disp: 30 tablet, Rfl: 3 ?  metoprolol succinate (TOPROL-XL) 50 MG 24 hr tablet, Take 1 tablet (50 mg total) by mouth daily. Take with or immediately following a meal., Disp: 30 tablet, Rfl: 0 ?  nitroGLYCERIN (NITROSTAT) 0.4 MG SL tablet, Place 1 tablet (0.4 mg total) under the tongue every 5 (five) minutes as needed for chest pain., Disp: 30 tablet, Rfl: 3 ?  rosuvastatin (CRESTOR) 40 MG tablet, Take 1 tablet (40 mg total)  by mouth daily., Disp: 30 tablet, Rfl: 0 ? ?Cardiovascular and other pertinent studies: ? ?EKG 06/05/2021: ?Sinus rhythm 80 bpm  ? ?Echocardiogram 03/18/2021:  ?Normal LV systolic function with visual EF 60-65%. Left ventricle cavity  ?is normal in size. Moderate left ventricular hypertrophy. Normal global  ?wall motion. Indeterminate diastolic filling pattern, indeterminate LAP.  ?No significant valvular disease.  ?Small pericardial effusion. There is no hemodynamic significance.  ?Compared to study 12/06/2020: LVEF improved from 40-45% to 60-65%  ?otherwise no significant change.  ? ?Lexiscan Sestamibi stress test 01/15/2021: ?Lexiscan nuclear stress test  performed using 1-day protocol. ?Stress EKG is non-diagnostic, as this is pharmacological stress test. In addition, stress EKG at 78% MPHR showed sinus tachycardia, 1 mm inferolateral downsloping ST depression, that normalize 1 min into recovery.  ?Normal myocardial perfusion. Stress LVEF 35% with moderate global decrease in myocardial thickening and wall motion. ?High risk study due to low stress LVEF. ? ?Coronary intervention 12/06/2020: ?LM: Normal ?LAD: No significant disease ?Lcx: Prox thrombotic 95% stenosis, 60% ostial OM1 stenosis ?       Mid Lcx occlusion, likely chronic ?RCA: Mid 60% disease. Right-to-left collaterals to occluded Lcx ?  ?Successful percutaneous coronary intervention prox Lcx-OM1 ?       PTCA and stent placement 3.0 X 30 mm Onyx Frontier drug-eluting  stent, post dilatation with 3.25X8 and 3.5X15 mm [place] balloons up to 20 atm ?  ?Distal embolization into small branches of OM1. Aggrastat bolus and infusion started. ? ?Echocardiogram 12/06/2020: ? 1. LVEF is depressed with hypokinesis of the distal anterior, basal  ?inferior and apical walls and severe hypokinesis of the distal lateral  ?walls. LVEF 40 to 45%. Left ventricular diastolic parameters are  ?consistent with Grade I diastolic dysfunction  ?(impaired relaxation). Elevated left atrial pressure.  ? 2. Right ventricular systolic function is normal. The right ventricular  ?size is normal.  ? 3. Soft tissue density epicardially, seen most prominently inferior to  ?RV. May represent adipose tissue, cannot exclude inflammation with  ?consolidation. No old images to compare . a small pericardial effusion is  ?present.  ? 4. The mitral valve is normal in structure. No evidence of mitral valve  ?regurgitation.  ? 5. The aortic valve is normal in structure. Aortic valve regurgitation is  ?not visualized.  ? 6. The inferior vena cava is normal in size with greater than 50%  ?respiratory variability, suggesting right atrial pressure of 3 mmHg.   ? ? ?Recent labs: ?06/27/2021: ?Glucose 318, BUN/Cr 9/1.26. EGFR 49. Na/K 138/4.5. Rest of the CMP normal ?H/H 10.6/34.9. MCV 85. Platelets 350 ?HbA1C 11.5% ?Chol 75, TG 82, HDL 344, LDL 15 ?TSH 0.03 very low ?Free T4 1.6 normal ? ?01/02/2021: (After discontiuing Entresto) ?Glucose 269, BUN/Cr 9/1.26. EGFR 49. Na/K 139/4.1.  ? ?12/24/2020: (On Entresto 24-26 mg bid) ?Glucose 243, BUN/Cr 11/1.82. EGFR 32. Na/K 132/3.8.  ? ?12/16/2020: (On Entresto 49-51 mg bid) ?Glucose 195, BUN/Cr 11/1.47. EGFR 41. Na/K 139/4.1.  ? ?12/07/2020: (On Entresto 24-26 mg bid) ?Glucose 180, BUN/Cr 10/0.87. EGFR >60. Na/K 136/4.4.  ?H/H 11.7/35.7. MCV 87. Platelets 370 ?HbA1C 9.4% ?Chol 201, TG 78, HDL 51, LDL 134 ?TSH N/A ? ?Results for Monica, Bell (MRN [id number]) as of 12/10/2020 11:10 ? Ref. Range 12/06/2020 01:39 12/06/2020 05:03 12/06/2020 07:46 12/06/2020 10:07  ?Troponin I (High Sensitivity) Latest Ref Range: <18 ng/L 149 (HH) 396 (HH) 734 (HH) 1,050 (HH)  ? ? ? ?Review of Systems  ?Constitutional: Positive for malaise/fatigue.  ?Cardiovascular:  Positive  for chest pain (Different from her angina at the time of MI). Negative for dyspnea on exertion, leg swelling, palpitations and syncope.  ? ?   ? ?Vitals:  ? 07/03/21 1411  ?BP: (!) 154/65  ?Pulse: 76  ?Resp: 16  ?Temp: 98 ?F (36.7 ?C)  ?SpO2: 96%  ? ? ? ?Body mass index is 25.1 kg/m?. Danley Danker Weights  ? 07/03/21 1411  ?Weight: 180 lb (81.6 kg)  ? ? ? ?Objective:  ? Physical Exam ?Vitals and nursing note reviewed.  ?Constitutional:   ?   General: She is not in acute distress. ?Neck:  ?   Vascular: No JVD.  ?Cardiovascular:  ?   Rate and Rhythm: Normal rate and regular rhythm.  ?   Pulses: Decreased pulses.  ?   Heart sounds: Normal heart sounds. No murmur heard. ?Pulmonary:  ?   Effort: Pulmonary effort is normal.  ?   Breath sounds: Normal breath sounds. No wheezing or rales.  ?Musculoskeletal:  ?   Right lower leg: No edema.  ?   Left lower leg: No edema.  ? ? ?   ? ?Assessment &  Recommendations:  ? ?61 year old African-American female with hypertension, hyperlipidemia, type 2 diabetes mellitus, tobacco dependence, CAD, HFrEF-now recovered EF, GERD ?  ?CAD: ?NSTEMI 11/2020. ?Culprit: throm

## 2021-07-16 ENCOUNTER — Other Ambulatory Visit: Payer: Self-pay

## 2021-07-16 DIAGNOSIS — I5020 Unspecified systolic (congestive) heart failure: Secondary | ICD-10-CM | POA: Diagnosis not present

## 2021-07-16 DIAGNOSIS — I2582 Chronic total occlusion of coronary artery: Secondary | ICD-10-CM | POA: Diagnosis not present

## 2021-07-16 DIAGNOSIS — I110 Hypertensive heart disease with heart failure: Secondary | ICD-10-CM | POA: Diagnosis not present

## 2021-07-16 DIAGNOSIS — I25112 Atherosclerosic heart disease of native coronary artery with refractory angina pectoris: Secondary | ICD-10-CM | POA: Diagnosis not present

## 2021-07-16 LAB — BASIC METABOLIC PANEL
Anion gap: 6 (ref 5–15)
BUN: 18 mg/dL (ref 6–20)
CO2: 22 mmol/L (ref 22–32)
Calcium: 8.9 mg/dL (ref 8.9–10.3)
Chloride: 107 mmol/L (ref 98–111)
Creatinine, Ser: 1.37 mg/dL — ABNORMAL HIGH (ref 0.44–1.00)
GFR, Estimated: 44 mL/min — ABNORMAL LOW (ref >60)
Glucose, Bld: 134 mg/dL — ABNORMAL HIGH (ref 70–99)
Potassium: 4 mmol/L (ref 3.5–5.1)
Sodium: 135 mmol/L (ref 135–145)

## 2021-07-16 LAB — CBC
HCT: 31.1 % — ABNORMAL LOW (ref 36.0–46.0)
Hemoglobin: 10.1 g/dL — ABNORMAL LOW (ref 12.0–15.0)
MCH: 26.6 pg (ref 26.0–34.0)
MCHC: 32.5 g/dL (ref 30.0–36.0)
MCV: 81.8 fL (ref 80.0–100.0)
Platelets: 324 10*3/uL (ref 150–400)
RBC: 3.8 MIL/uL — ABNORMAL LOW (ref 3.87–5.11)
RDW: 15.7 % — ABNORMAL HIGH (ref 11.5–15.5)
WBC: 12.2 10*3/uL — ABNORMAL HIGH (ref 4.0–10.5)
nRBC: 0 % (ref 0.0–0.2)

## 2021-07-16 LAB — GLUCOSE, CAPILLARY
Glucose-Capillary: 179 mg/dL — ABNORMAL HIGH (ref 70–99)
Glucose-Capillary: 181 mg/dL — ABNORMAL HIGH (ref 70–99)

## 2021-07-16 NOTE — Progress Notes (Signed)
Patient seen by cardiac rehab team. ?

## 2021-07-16 NOTE — Plan of Care (Signed)
  Problem: Activity: Goal: Ability to return to baseline activity level will improve Outcome: Progressing   Problem: Cardiovascular: Goal: Ability to achieve and maintain adequate cardiovascular perfusion will improve Outcome: Progressing Goal: Vascular access site(s) Level 0-1 will be maintained Outcome: Progressing   

## 2021-07-16 NOTE — Progress Notes (Signed)
CARDIAC REHAB PHASE I  ? ?PRE:  Rate/Rhythm: 68 SR ? ?  BP: sitting 119/61 ? ?  SaO2: 100 RA ? ?MODE:  Ambulation: 400 ft  ? ?POST:  Rate/Rhythm: 92 SR ? ?  BP: sitting 115/64  ? ?  SaO2:  ? ?Pt steady but very slow pace. C/o fatigue with distance. Seems deconditioned. Denied SOB or CP. ? ?Discussed stent, Brilinta, restrictions, smoking cessation, diet, exercise, NTG and CRPII. Pt is only smoking 2-3 cigarettes currently and voices motivation to quit now. Gave resources. She has a difficult time with diet choices but is having close f/u with her PCP regarding DM. They recently changed her meds. I highly encouraged her to work on exercise. Will refer to [place]. She briefly did CRPII at [facility] in the fall however was having CP with exercise.  ? ?Towards the end of education pt felt faint on EOB, "my sugar is dropping". This happened 20 min after she took her meds. CBG 181, BP 112/76. HR elevated at 100-105 ST. RN aware. Left pt resting in bed. ?[phone] ? ?[place], ACSM ?07/16/2021 ?9:46 AM ? ? ? ? ?

## 2021-07-16 NOTE — Progress Notes (Signed)
Discharge order in place.  Patient has been seen by cardiac rehab.  Discharge instructions given to patient.  Education emphasized on compliance with medications and keeping doctors' follow ups.  Questions and needs addressed.  Encouraged to call the doctor for questions.  Discharged home. ?

## 2021-07-17 ENCOUNTER — Encounter (HOSPITAL_COMMUNITY): Payer: Self-pay | Admitting: Cardiology

## 2021-07-17 ENCOUNTER — Encounter: Payer: Self-pay | Admitting: Cardiology

## 2021-07-17 NOTE — Discharge Summary (Signed)
Physician Discharge Summary  ?Patient ID: ?Monica Bell ?MRN: [id number] ?DOB/AGE: 1960-07-14 61 y.o. ? ?Admit date: 07/15/2021 ?Discharge date: 07/17/2021 ? ?Primary Discharge Diagnosis: ?Coronary artery disease ? ?Secondary Discharge Diagnosis: ?Uncontrolled type 2 DM ?Hypertension ?Mixed hyperlipidemia ? ? ?Hospital Course:  ? ?61 year old African-American female with hypertension, hyperlipidemia, type 2 diabetes mellitus, tobacco dependence, CAD, HFrEF-now recovered EF, GERD, now with refractory angina symptoms in spite of optimal medical therapy and previously normal stress test in 12/2020. ? ?Patient underwent CTO intervention to mid Lcx, details below. Patient had no recurrence of chest pain. Cardiac rehab and f./u appt arranged.  ? ? ?Discharge Exam: ?Blood pressure 119/61, pulse 63, temperature 98.3 ?F (36.8 ?C), temperature source Oral, resp. rate 14, height $RemoveBe'5\' 11"'eWWbdQScs$  (1.803 m), weight 78 kg, SpO2 100 %.  ? ?Physical Exam ?Vitals and nursing note reviewed.  ?Constitutional:   ?   General: She is not in acute distress. ?Neck:  ?   Vascular: No JVD.  ?Cardiovascular:  ?   Rate and Rhythm: Normal rate and regular rhythm.  ?   Heart sounds: Normal heart sounds. No murmur heard. ?Pulmonary:  ?   Effort: Pulmonary effort is normal.  ?   Breath sounds: Normal breath sounds. No wheezing or rales.  ?Musculoskeletal:  ?   Right lower leg: No edema.  ?   Left lower leg: No edema.  ? ? ? ?Significant Diagnostic Studies: ? ?EKG 07/16/2021: ?Sinus rhythm ?Normal EKG ? ?Coronary intervention 07/15/2021: ?LM: Mod 40% disease with MLA 7-9 mm2 ?LAD: Minimal diffuse disease ?Lcx: Prox 40-50% disease ?       Mid Lcx CTO ?       Patent OM1 stent with no restenosis ?RCA: Mid 40% disease ?         Grade 2 collaterals to occluded Lcx ?  ?LVEDP normal ?  ?(IVUS) guided successful percutaneous coronary intervention mid Lcx (CTO intervention) ?    PTCA and stent placement 2.25 X 26 mm Onyx Frontier drug-eluting stent ?    Proximal  optimization with 2.75X15 mm [place] balloon at 18 atm ?    Kissing balloon post stenting angioplasty Lcx/OM1 w/2.75X15 mm and 3.0X8 mm [place] balloons at 12  ?  ?0% residual stenosis ?Excellent stent apposition and expansion and ostial mid Lcx coverage ?  ?  ?  ?  ?  ?   ?  ?  ?  ? ? ? ?FOLLOW UP PLANS AND APPOINTMENTS ?Discharge Instructions   ? ? Amb Referral to Cardiac Rehabilitation   Complete by: As directed ?  ? Diagnosis:  Coronary Stents ?PTCA  ?  ? After initial evaluation and assessments completed: Virtual Based Care may be provided alone or in conjunction with Phase 2 Cardiac Rehab based on patient barriers.: Yes  ? Diet - low sodium heart healthy   Complete by: As directed ?  ? Increase activity slowly   Complete by: As directed ?  ? ?  ? ?Allergies as of 07/16/2021   ?No Known Allergies ?  ? ?  ?Medication List  ?  ? ?STOP taking these medications   ? ?ranolazine 500 MG 12 hr tablet ?Commonly known as: Ranexa ?  ? ?  ? ?TAKE these medications   ? ?amLODipine 5 MG tablet ?Commonly known as: NORVASC ?Take 1 tablet (5 mg total) by mouth daily. ?  ?Brilinta 90 MG Tabs tablet ?Generic drug: ticagrelor ?TAKE 1 TABLET BY MOUTH TWICE DAILY ?What changed: how much to take ?  ?CALTRATE  600+D PO ?Take 1 tablet by mouth daily. ?  ?cetirizine 10 MG tablet ?Commonly known as: ZYRTEC ?Take 10 mg by mouth daily. ?  ?cholecalciferol 25 MCG (1000 UNIT) tablet ?Commonly known as: VITAMIN D3 ?Take 1,000 Units by mouth daily. ?  ?esomeprazole 20 MG capsule ?Commonly known as: NexIUM ?Take 1 capsule (20 mg total) by mouth daily at 12 noon. ?  ?GNP Aspirin Low Dose 81 MG EC tablet ?Generic drug: aspirin ?TAKE 1 TABLET BY MOUTH ONCE DAILY. SWALLOW WHOLE. ?What changed: See the new instructions. ?  ?insulin aspart 100 UNIT/ML injection ?Commonly known as: novoLOG ?Inject 10 Units into the skin 3 (three) times daily before meals. ?  ?Investigational - Study Medication ?EMPACT-MI Study  ?Empagliflozin /or Placebo ?   ?isosorbide-hydrALAZINE 20-37.5 MG tablet ?Commonly known as: BiDil ?Take 1 tablet by mouth 3 (three) times daily. ?  ?ivabradine 5 MG Tabs tablet ?Commonly known as: Corlanor ?Take 1 tablet (5 mg total) by mouth 2 (two) times daily with a meal. ?  ?levothyroxine 137 MCG tablet ?Commonly known as: SYNTHROID ?Take 137 mcg by mouth daily. ?  ?magnesium oxide 400 MG tablet ?Commonly known as: MAG-OX ?Take 400 mg by mouth 3 (three) times a week. ?  ?metFORMIN 1000 MG tablet ?Commonly known as: GLUCOPHAGE ?Take 1,000 mg by mouth 2 (two) times daily with a meal. ?  ?metoprolol succinate 50 MG 24 hr tablet ?Commonly known as: TOPROL-XL ?Take 1 tablet (50 mg total) by mouth daily. Take with or immediately following a meal. ?  ?nitroGLYCERIN 0.4 MG SL tablet ?Commonly known as: NITROSTAT ?Place 1 tablet (0.4 mg total) under the tongue every 5 (five) minutes as needed for chest pain. ?  ?rosuvastatin 40 MG tablet ?Commonly known as: CRESTOR ?Take 1 tablet (40 mg total) by mouth daily. ?  ?Tyler Aas FlexTouch 200 UNIT/ML FlexTouch Pen ?Generic drug: insulin degludec ?Inject 50 Units into the skin daily. ?  ?vitamin B-12 1000 MCG tablet ?Commonly known as: CYANOCOBALAMIN ?Take 1,000 mcg by mouth daily. ?  ? ?  ? ? Follow-up Information   ? ? Nigel Mormon, MD Follow up on 07/30/2021.   ?Specialties: Cardiology, Radiology ?Why: 8:45 AM ?Contact information: ?[address] ?[address] ?[place] [address] ?[phone] ? ? ?  ?  ? ?  ?  ? ?  ? ? ? ? ?Nigel Mormon, MD ?Pager: [phone] ?Office: [phone] ? ? ?

## 2021-07-24 ENCOUNTER — Other Ambulatory Visit: Payer: Self-pay | Admitting: Cardiology

## 2021-07-24 DIAGNOSIS — I25118 Atherosclerotic heart disease of native coronary artery with other forms of angina pectoris: Secondary | ICD-10-CM

## 2021-07-29 NOTE — Progress Notes (Signed)
? ? ?Patient referred by Monica Rival, NP for coronary artery disease ? ?Subjective:  ? ?Monica Bell, female    DOB: 1960-10-08, 61 y.o.   MRN: [id number] ? ?Chief Complaint  ?Patient presents with  ? Coronary Artery Disease  ? Follow-up  ?  2 week  ? ? ?HPI ? ?61 year old African-American female with hypertension, hyperlipidemia, type 2 diabetes mellitus, tobacco dependence, CAD, HFrEF-now recovered EF, GERD ? ?Patient underwent successful mid Lcx CTO PCI (06/2021). She has had near resolution of her anginal symptoms with physical activity.  She feels her heart races when she starts doing physical activity.  That said, she has been fairly deconditioned for a while now. ? ? ?Current Outpatient Medications:  ?  amLODipine (NORVASC) 5 MG tablet, TAKE 1 TABLET BY MOUTH ONCE DAILY., Disp: 30 tablet, Rfl: 0 ?  BRILINTA 90 MG TABS tablet, TAKE 1 TABLET BY MOUTH TWICE DAILY (Patient taking differently: Take 90 mg by mouth 2 (two) times daily.), Disp: 60 tablet, Rfl: 11 ?  Calcium Carbonate-Vitamin D (CALTRATE 600+D PO), Take 1 tablet by mouth daily., Disp: , Rfl:  ?  cetirizine (ZYRTEC) 10 MG tablet, Take 10 mg by mouth daily., Disp: , Rfl:  ?  cholecalciferol (VITAMIN D3) 25 MCG (1000 UNIT) tablet, Take 1,000 Units by mouth daily., Disp: , Rfl:  ?  esomeprazole (NEXIUM) 20 MG capsule, Take 1 capsule (20 mg total) by mouth daily at 12 noon., Disp: 30 capsule, Rfl: 3 ?  GNP ASPIRIN LOW DOSE 81 MG EC tablet, TAKE 1 TABLET BY MOUTH ONCE DAILY. SWALLOW WHOLE. (Patient taking differently: Take 81 mg by mouth daily.), Disp: 90 tablet, Rfl: 1 ?  insulin aspart (NOVOLOG) 100 UNIT/ML injection, Inject 10 Units into the skin 3 (three) times daily before meals., Disp: , Rfl:  ?  Investigational - Study Medication, EMPACT-MI Study  Empagliflozin /or Placebo, Disp: , Rfl:  ?  isosorbide-hydrALAZINE (BIDIL) 20-37.5 MG tablet, Take 1 tablet by mouth 3 (three) times daily., Disp: 90 tablet, Rfl: 3 ?  ivabradine (CORLANOR) 5 MG  TABS tablet, Take 1 tablet (5 mg total) by mouth 2 (two) times daily with a meal. (Patient not taking: Reported on 07/15/2021), Disp: 60 tablet, Rfl: 2 ?  levothyroxine (SYNTHROID) 137 MCG tablet, Take 137 mcg by mouth daily., Disp: , Rfl:  ?  magnesium oxide (MAG-OX) 400 MG tablet, Take 400 mg by mouth 3 (three) times a week., Disp: , Rfl:  ?  metFORMIN (GLUCOPHAGE) 1000 MG tablet, Take 1,000 mg by mouth 2 (two) times daily with a meal., Disp: , Rfl:  ?  metoprolol succinate (TOPROL-XL) 50 MG 24 hr tablet, Take 1 tablet (50 mg total) by mouth daily. Take with or immediately following a meal., Disp: 30 tablet, Rfl: 0 ?  nitroGLYCERIN (NITROSTAT) 0.4 MG SL tablet, Place 1 tablet (0.4 mg total) under the tongue every 5 (five) minutes as needed for chest pain., Disp: 30 tablet, Rfl: 3 ?  rosuvastatin (CRESTOR) 40 MG tablet, Take 1 tablet (40 mg total) by mouth daily., Disp: 30 tablet, Rfl: 0 ?  TRESIBA FLEXTOUCH 200 UNIT/ML FlexTouch Pen, Inject 50 Units into the skin daily., Disp: , Rfl:  ?  vitamin B-12 (CYANOCOBALAMIN) 1000 MCG tablet, Take 1,000 mcg by mouth daily., Disp: , Rfl:  ? ?Cardiovascular and other pertinent studies: ? ?EKG 07/30/2021: ?Sinus rhythm 67 bpm ?Low voltage, otherwise normal EKG ? ?Coronary intervention 07/15/2021: ?LM: Mod 40% disease with MLA 7-9 mm2 ?LAD: Minimal diffuse disease ?Lcx:  Prox 40-50% disease ?       Mid Lcx CTO ?       Patent OM1 stent with no restenosis ?RCA: Mid 40% disease ?         Grade 2 collaterals to occluded Lcx ?  ?LVEDP normal ?  ?(IVUS) guided successful percutaneous coronary intervention mid Lcx (CTO intervention) ?    PTCA and stent placement 2.25 X 26 mm Onyx Frontier drug-eluting stent ?    Proximal optimization with 2.75X15 mm [place] balloon at 18 atm ?    Kissing balloon post stenting angioplasty Lcx/OM1 w/2.75X15 mm and 3.0X8 mm [place] balloons at 12  ?  ?0% residual stenosis ?Excellent stent apposition and expansion and ostial mid Lcx coverage ?  ?  ?  ?  ?  ?  ?  ?   ?   ?  ?  ? ? ?Echocardiogram 03/18/2021:  ?Normal LV systolic function with visual EF 60-65%. Left ventricle cavity  ?is normal in size. Moderate left ventricular hypertrophy. Normal global  ?wall motion. Indeterminate diastolic filling pattern, indeterminate LAP.  ?No significant valvular disease.  ?Small pericardial effusion. There is no hemodynamic significance.  ?Compared to study 12/06/2020: LVEF improved from 40-45% to 60-65%  ?otherwise no significant change.  ? ?Coronary intervention 12/06/2020: ?LM: Normal ?LAD: No significant disease ?Lcx: Prox thrombotic 95% stenosis, 60% ostial OM1 stenosis ?       Mid Lcx occlusion, likely chronic ?RCA: Mid 60% disease. Right-to-left collaterals to occluded Lcx ?  ?Successful percutaneous coronary intervention prox Lcx-OM1 ?       PTCA and stent placement 3.0 X 30 mm Onyx Frontier drug-eluting  stent, post dilatation with 3.25X8 and 3.5X15 mm  balloons up to 20 atm ?  ?Distal embolization into small branches of OM1. Aggrastat bolus and infusion started. ? ? ?Recent labs: ?06/27/2021: ?Glucose 318, BUN/Cr 9/1.26. EGFR 49. Na/K 138/4.5. Rest of the CMP normal ?H/H 10.6/34.9. MCV 85. Platelets 350 ?HbA1C 11.5% ?Chol 75, TG 82, HDL 344, LDL 15 ?TSH 0.03 very low ?Free T4 1.6 normal ? ?01/02/2021: (After discontiuing Entresto) ?Glucose 269, BUN/Cr 9/1.26. EGFR 49. Na/K 139/4.1.  ? ?12/24/2020: (On Entresto 24-26 mg bid) ?Glucose 243, BUN/Cr 11/1.82. EGFR 32. Na/K 132/3.8.  ? ?12/16/2020: (On Entresto 49-51 mg bid) ?Glucose 195, BUN/Cr 11/1.47. EGFR 41. Na/K 139/4.1.  ? ?12/07/2020: (On Entresto 24-26 mg bid) ?Glucose 180, BUN/Cr 10/0.87. EGFR >60. Na/K 136/4.4.  ?H/H 11.7/35.7. MCV 87. Platelets 370 ?HbA1C 9.4% ?Chol 201, TG 78, HDL 51, LDL 134 ?TSH N/A ? ?Results for Monica Bell, Monica Bell (MRN [id number]) as of 12/10/2020 11:10 ? Ref. Range 12/06/2020 01:39 12/06/2020 05:03 12/06/2020 07:46 12/06/2020 10:07  ?Troponin I (High Sensitivity) Latest Ref Range: <18 ng/L 149 (HH) 396 (HH) 734  (HH) 1,050 (HH)  ? ? ? ?Review of Systems  ?Constitutional: Positive for malaise/fatigue.  ?Cardiovascular:  Positive for chest pain (Different from her angina at the time of MI). Negative for dyspnea on exertion, leg swelling, palpitations and syncope.  ? ?   ? ?Vitals:  ? 07/30/21 0841  ?BP: (!) 126/57  ?Pulse: 76  ?Resp: 16  ?Temp: 98.4 ?F (36.9 ?C)  ?SpO2: 100%  ? ? ? ?Body mass index is 25.3 kg/m?. Danley Danker Weights  ? 07/30/21 0841  ?Weight: 181 lb 6.4 oz (82.3 kg)  ? ? ? ?Objective:  ? Physical Exam ?Vitals and nursing note reviewed.  ?Constitutional:   ?   General: She is not in acute distress. ?Neck:  ?  Vascular: No JVD.  ?Cardiovascular:  ?   Rate and Rhythm: Normal rate and regular rhythm.  ?   Pulses: Decreased pulses.  ?   Heart sounds: Normal heart sounds. No murmur heard. ?Pulmonary:  ?   Effort: Pulmonary effort is normal.  ?   Breath sounds: Normal breath sounds. No wheezing or rales.  ?Musculoskeletal:  ?   Right lower leg: No edema.  ?   Left lower leg: No edema.  ? ? ?   ? ?  ICD-10-CM   ?1. Coronary artery disease of native artery of native heart with stable angina pectoris ([place])  I25.118   ?  ?2. HFrEF (heart failure with reduced ejection fraction) (HCC)  I50.20   ?  ?3. Primary hypertension  I10   ?  ?4. Uncontrolled type 2 diabetes mellitus with hyperglycemia (HCC)  E11.65   ?  ?5. Research subject EMPACT AMI Study: EMPAgliflozin in CHF patients post acute MI  Z00.6   ?  ? ? ? ?Assessment & Recommendations:  ? ?61 year old African-American female with hypertension, hyperlipidemia, type 2 diabetes mellitus, tobacco dependence, CAD, HFrEF-now recovered EF, GERD ?  ?CAD: ?NSTEMI 11/2020. ?Culprit: thrombotic 95% stenosis Prox Lcx into large OM1 (11/2020) ?Non-culprit: Mid Lcx chronic occlusion with R-to-L grade 3 collaterals ?Now s/p CTO PCI to mid Lcx (06/2021) ?Moderate LM and mid RCA disease ?Recommend DAPT with Aspirin and Brilinta till 12/2021 ?Metoprolol succinate 50 mg daily, amlodipine 5 mg  daily. ?Continue Crestor and Praluent. LDL down to 15. (06/2021) ?Needs aggressive diabetes management, A1C 11% (06/2021) ?Tachycardia with exercise likely due to deconditioning.  I expect this to improve.

## 2021-07-30 ENCOUNTER — Ambulatory Visit: Payer: BC Managed Care – PPO | Admitting: Cardiology

## 2021-07-30 ENCOUNTER — Encounter: Payer: Self-pay | Admitting: Cardiology

## 2021-07-30 VITALS — BP 126/57 | HR 76 | Temp 98.4°F | Resp 16 | Ht 71.0 in | Wt 181.4 lb

## 2021-07-30 DIAGNOSIS — I10 Essential (primary) hypertension: Secondary | ICD-10-CM

## 2021-07-30 DIAGNOSIS — Z006 Encounter for examination for normal comparison and control in clinical research program: Secondary | ICD-10-CM

## 2021-07-30 DIAGNOSIS — I25118 Atherosclerotic heart disease of native coronary artery with other forms of angina pectoris: Secondary | ICD-10-CM

## 2021-07-30 DIAGNOSIS — E1165 Type 2 diabetes mellitus with hyperglycemia: Secondary | ICD-10-CM

## 2021-07-30 DIAGNOSIS — I5020 Unspecified systolic (congestive) heart failure: Secondary | ICD-10-CM

## 2021-09-03 ENCOUNTER — Encounter (HOSPITAL_COMMUNITY): Payer: Self-pay

## 2021-09-03 ENCOUNTER — Encounter (HOSPITAL_COMMUNITY)
Admission: RE | Admit: 2021-09-03 | Discharge: 2021-09-03 | Disposition: A | Discharge status: Home / Self Care | Payer: BC Managed Care – PPO | Source: Ambulatory Visit | Attending: Cardiology | Admitting: Cardiology

## 2021-09-03 VITALS — BP 106/54 | HR 75 | Ht 71.0 in | Wt 181.7 lb

## 2021-09-03 DIAGNOSIS — I214 Non-ST elevation (NSTEMI) myocardial infarction: Secondary | ICD-10-CM | POA: Insufficient documentation

## 2021-09-03 DIAGNOSIS — Z955 Presence of coronary angioplasty implant and graft: Secondary | ICD-10-CM | POA: Diagnosis present

## 2021-09-03 LAB — GLUCOSE, CAPILLARY: Glucose-Capillary: 217 mg/dL — ABNORMAL HIGH (ref 70–99)

## 2021-09-03 NOTE — Progress Notes (Signed)
Cardiac Individual Treatment Plan ? ?Patient Details  ?Name: Monica Bell ?MRN: [id number] ?Date of Birth: 06-13-1960 ?Referring Provider:   ?Flowsheet Row CARDIAC REHAB PHASE II ORIENTATION from 09/03/2021 in [place]  ?Referring Provider Dr. Virgina Jock  ? ?  ? ? ?Initial Encounter Date:  ?Flowsheet Row CARDIAC REHAB PHASE II ORIENTATION from 09/03/2021 in [facility]  ?Date 09/03/21  ? ?  ? ? ?Visit Diagnosis: Status post coronary artery stent placement ? ?Patient's Home Medications on Admission: ? ?Current Outpatient Medications:  ?  amLODipine (NORVASC) 5 MG tablet, TAKE 1 TABLET BY MOUTH ONCE DAILY. (Patient taking differently: Take 5 mg by mouth daily.), Disp: 30 tablet, Rfl: 0 ?  BRILINTA 90 MG TABS tablet, TAKE 1 TABLET BY MOUTH TWICE DAILY (Patient taking differently: Take 90 mg by mouth 2 (two) times daily.), Disp: 60 tablet, Rfl: 11 ?  Calcium Carbonate-Vitamin D (CALTRATE 600+D PO), Take 1 tablet by mouth daily., Disp: , Rfl:  ?  cetirizine (ZYRTEC) 10 MG tablet, Take 10 mg by mouth daily., Disp: , Rfl:  ?  cholecalciferol (VITAMIN D3) 25 MCG (1000 UNIT) tablet, Take 1,000 Units by mouth daily., Disp: , Rfl:  ?  esomeprazole (NEXIUM) 20 MG capsule, Take 1 capsule (20 mg total) by mouth daily at 12 noon., Disp: 30 capsule, Rfl: 3 ?  GNP ASPIRIN LOW DOSE 81 MG EC tablet, TAKE 1 TABLET BY MOUTH ONCE DAILY. SWALLOW WHOLE. (Patient taking differently: Take 81 mg by mouth daily.), Disp: 90 tablet, Rfl: 1 ?  insulin aspart (NOVOLOG) 100 UNIT/ML injection, Inject 10 Units into the skin 3 (three) times daily before meals., Disp: , Rfl:  ?  Investigational - Study Medication, EMPACT-MI Study  Empagliflozin /or Placebo, Disp: , Rfl:  ?  levothyroxine (SYNTHROID) 137 MCG tablet, Take 137 mcg by mouth daily., Disp: , Rfl:  ?  magnesium oxide (MAG-OX) 400 MG tablet, Take 400 mg by mouth 3 (three) times a week., Disp: , Rfl:  ?  metFORMIN (GLUCOPHAGE) 1000 MG tablet,  Take 1,000 mg by mouth 2 (two) times daily with a meal., Disp: , Rfl:  ?  metoprolol succinate (TOPROL-XL) 50 MG 24 hr tablet, Take 1 tablet (50 mg total) by mouth daily. Take with or immediately following a meal., Disp: 30 tablet, Rfl: 0 ?  rosuvastatin (CRESTOR) 40 MG tablet, Take 1 tablet (40 mg total) by mouth daily., Disp: 30 tablet, Rfl: 0 ?  TRESIBA FLEXTOUCH 200 UNIT/ML FlexTouch Pen, Inject 50 Units into the skin daily., Disp: , Rfl:  ?  vitamin B-12 (CYANOCOBALAMIN) 1000 MCG tablet, Take 1,000 mcg by mouth daily., Disp: , Rfl:  ?  isosorbide-hydrALAZINE (BIDIL) 20-37.5 MG tablet, Take 1 tablet by mouth 3 (three) times daily. (Patient not taking: Reported on 07/30/2021), Disp: 90 tablet, Rfl: 3 ?  ivabradine (CORLANOR) 5 MG TABS tablet, Take 1 tablet (5 mg total) by mouth 2 (two) times daily with a meal. (Patient not taking: Reported on 07/30/2021), Disp: 60 tablet, Rfl: 2 ?  nitroGLYCERIN (NITROSTAT) 0.4 MG SL tablet, Place 1 tablet (0.4 mg total) under the tongue every 5 (five) minutes as needed for chest pain., Disp: 30 tablet, Rfl: 3 ? ?Past Medical History: ?Past Medical History:  ?Diagnosis Date  ? Abnormal mammogram   ? Allergic rhinitis due to pollen 08/01/2019  ? Arthritis 01/06/2019  ? osteoarthritis of both knees  ? BMI 28.0-28.9,adult 08/01/2019  ? Cystocele, unspecified (CODE)   ? Diabetes mellitus   ? GERD (  gastroesophageal reflux disease)   ? Heart murmur   ? Hyperlipemia, mixed 12/14/2018  ? Hypertension   ? Hypothyroidism   ? Stress incontinence, female   ? Thyroid disease   ? Type 2 diabetes mellitus, without long-term current use of insulin ([place]) 01/06/2019  ? Vitamin D deficiency 01/06/2019  ? ? ?Tobacco Use: ?Social History  ? ?Tobacco Use  ?Smoking Status Former  ? Packs/day: 0.50  ? Years: 15.00  ? Pack years: 7.50  ? Types: Cigarettes  ? Quit date: 11/2020  ? Years since quitting: 0.7  ?Smokeless Tobacco Never  ? ? ?Labs: ?Review Flowsheet   ? ?  ?  Latest Ref Rng & Units 12/06/2020  07/15/2021  ?Labs for ITP Cardiac and Pulmonary Rehab  ?Cholestrol 0 - 200 mg/dL 201     ?LDL (calc) 0 - 99 mg/dL 134     ?HDL-C >40 mg/dL 51     ?Trlycerides <150 mg/dL 78     ?Hemoglobin A1c 4.8 - 5.6 % 9.4   11.1    ?TCO2 22 - 32 mmol/L  24    ?  ? ? Multiple values from one day are sorted in reverse-chronological order  ?  ?  ? ? ?Capillary Blood Glucose: ?Lab Results  ?Component Value Date  ? GLUCAP 217 (H) 09/03/2021  ? GLUCAP 181 (H) 07/16/2021  ? GLUCAP 179 (H) 07/16/2021  ? GLUCAP 215 (H) 07/15/2021  ? GLUCAP 122 (H) 07/15/2021  ? ? ? ?Exercise Target Goals: ?Exercise Program Goal: ?Individual exercise prescription set using results from initial 6 min walk test and THRR while considering  patient?s activity barriers and safety.  ? ?Exercise Prescription Goal: ?Starting with aerobic activity 30 plus minutes a day, 3 days per week for initial exercise prescription. Provide home exercise prescription and guidelines that participant acknowledges understanding prior to discharge. ? ?Activity Barriers & Risk Stratification: ? Activity Barriers & Cardiac Risk Stratification - 09/03/21 1329   ? ?  ? Activity Barriers & Cardiac Risk Stratification  ? Activity Barriers Arthritis;Joint Problems   ? Cardiac Risk Stratification High   ? ?  ?  ? ?  ? ? ?6 Minute Walk: ? 6 Minute Walk   ? ? [place] Name 09/03/21 1448  ?  ?  ?  ? 6 Minute Walk  ? Phase Initial    ? Distance 750 feet    ? Walk Time 6 minutes    ? # of Rest Breaks 0    ? MPH 1.42    ? METS 2.35    ? RPE 11    ? VO2 Peak 8.24    ? Symptoms Yes (comment)    ? Comments burning in center of chest 3/10. Resolved with rest    ? Resting HR 75 bpm    ? Resting BP 106/54    ? Resting Oxygen Saturation  98 %    ? Exercise Oxygen Saturation  during 6 min walk 97 %    ? Max Ex. HR 76 bpm    ? Max Ex. BP 112/60    ? 2 Minute Post BP 120/68    ? ?  ?  ? ?  ? ? ?Oxygen Initial Assessment: ? ? ?Oxygen Re-Evaluation: ? ? ?Oxygen Discharge (Final Oxygen  Re-Evaluation): ? ? ?Initial Exercise Prescription: ? Initial Exercise Prescription - 09/03/21 1400   ? ?  ? Date of Initial Exercise RX and Referring Provider  ? Date 09/03/21   ? Referring Provider Dr. Virgina Jock   ?  Expected Discharge Date 11/28/21   ?  ? Treadmill  ? MPH 1.4   ? Grade 0   ? Minutes 17   ?  ? Recumbant Elliptical  ? Level 1   ? RPM 50   ? Minutes 22   ?  ? Prescription Details  ? Frequency (times per week) 3   ? Duration Progress to 30 minutes of continuous aerobic without signs/symptoms of physical distress   ?  ? Intensity  ? THRR 40-80% of Max Heartrate 64-128   ? Ratings of Perceived Exertion 11-13   ? Perceived Dyspnea 0-4   ?  ? Resistance Training  ? Training Prescription Yes   ? Weight 2   ? Reps 10-15   ? ?  ?  ? ?  ? ? ?Perform Capillary Blood Glucose checks as needed. ? ?Exercise Prescription Changes: ? ? ?Exercise Comments: ? ? ?Exercise Goals and Review: ? ? Exercise Goals   ? ? [place] Name 09/03/21 1451  ?  ?  ?  ?  ?  ? Exercise Goals  ? Increase Physical Activity Yes      ? Intervention Provide advice, education, support and counseling about physical activity/exercise needs.;Develop an individualized exercise prescription for aerobic and resistive training based on initial evaluation findings, risk stratification, comorbidities and participant's personal goals.      ? Expected Outcomes Short Term: Attend rehab on a regular basis to increase amount of physical activity.;Long Term: Add in home exercise to make exercise part of routine and to increase amount of physical activity.;Long Term: Exercising regularly at least 3-5 days a week.      ? Increase Strength and Stamina Yes      ? Intervention Provide advice, education, support and counseling about physical activity/exercise needs.;Develop an individualized exercise prescription for aerobic and resistive training based on initial evaluation findings, risk stratification, comorbidities and participant's personal goals.      ? Expected  Outcomes Short Term: Increase workloads from initial exercise prescription for resistance, speed, and METs.;Short Term: Perform resistance training exercises routinely during rehab and add in resistance training at home;Long Term: Imp

## 2021-09-08 ENCOUNTER — Encounter (HOSPITAL_COMMUNITY): Payer: BC Managed Care – PPO

## 2021-09-10 ENCOUNTER — Encounter (HOSPITAL_COMMUNITY)
Admission: RE | Admit: 2021-09-10 | Discharge: 2021-09-10 | Disposition: A | Discharge status: Home / Self Care | Payer: BC Managed Care – PPO | Source: Ambulatory Visit | Attending: Cardiology | Admitting: Cardiology

## 2021-09-10 DIAGNOSIS — Z955 Presence of coronary angioplasty implant and graft: Secondary | ICD-10-CM | POA: Diagnosis not present

## 2021-09-10 DIAGNOSIS — I214 Non-ST elevation (NSTEMI) myocardial infarction: Secondary | ICD-10-CM

## 2021-09-10 LAB — GLUCOSE, CAPILLARY: Glucose-Capillary: 228 mg/dL — ABNORMAL HIGH (ref 70–99)

## 2021-09-10 NOTE — Progress Notes (Signed)
Daily Session Note ? ?Patient Details  ?Name: Monica Bell ?MRN: [id number] ?Date of Birth: 01-Oct-1960 ?Referring Provider:   ?Flowsheet Row CARDIAC REHAB PHASE II ORIENTATION from 09/03/2021 in [place]  ?Referring Provider Dr. Virgina Jock  ? ?  ? ? ?Encounter Date: 09/10/2021 ? ?Check In: ? Session Check In - 09/10/21 1432   ? ?  ? Check-In  ? Supervising physician immediately available to respond to emergencies [facility] MD immediately available   ? Physician(s) Dr Harl Bowie   ? Location [facility]   ? Staff Present Hoy Register, MS, ACSM-CEP, Exercise Physiologist;Najah Liverman Hassell Done, RN, BSN;Phyllis Billingsley, RN;Heather Otho Ket, BS, Exercise Physiologist   ? Virtual Visit No   ? Medication changes reported     No   ? Fall or balance concerns reported    No   ? Tobacco Cessation No Change   ? Warm-up and Cool-down Performed as group-led instruction   ? Resistance Training Performed Yes   ? VAD Patient? No   ? PAD/SET Patient? No   ?  ? Pain Assessment  ? Currently in Pain? No/denies   ? Multiple Pain Sites No   ? ?  ?  ? ?  ? ? ?Capillary Blood Glucose: ?No results found for this or any previous visit (from the past 24 hour(s)). ? ? ? ?Social History  ? ?Tobacco Use  ?Smoking Status Former  ? Packs/day: 0.50  ? Years: 15.00  ? Pack years: 7.50  ? Types: Cigarettes  ? Quit date: 11/2020  ? Years since quitting: 0.7  ?Smokeless Tobacco Never  ? ? ?Goals Met:  ?Independence with exercise equipment ?Exercise tolerated well ?No report of concerns or symptoms today ?Strength training completed today ? ?Goals Unmet:  ?Not Applicable ? ?Comments: check out at 1545. ? ? ?Dr. Carlyle Dolly is Medical Director for [place] ?

## 2021-09-12 ENCOUNTER — Encounter (HOSPITAL_COMMUNITY)
Admission: RE | Admit: 2021-09-12 | Discharge: 2021-09-12 | Disposition: A | Discharge status: Home / Self Care | Payer: BC Managed Care – PPO | Source: Ambulatory Visit | Attending: Cardiology | Admitting: Cardiology

## 2021-09-12 DIAGNOSIS — Z955 Presence of coronary angioplasty implant and graft: Secondary | ICD-10-CM | POA: Diagnosis not present

## 2021-09-12 DIAGNOSIS — I214 Non-ST elevation (NSTEMI) myocardial infarction: Secondary | ICD-10-CM

## 2021-09-12 NOTE — Progress Notes (Signed)
Daily Session Note  Patient Details  Name: Monica Bell MRN: [id number] Date of Birth: [birth year] Referring Provider:   Flowsheet Row CARDIAC REHAB PHASE II ORIENTATION from 09/03/2021 in [place]  Referring Provider Dr. Virgina Jock       Encounter Date: 09/12/2021  Check In:  Session Check In - 09/12/21 1439       Check-In   Supervising physician immediately available to respond to emergencies [facility] MD immediately available    Physician(s) Dr. Gasper Sells    Location [facility]    Staff Present Geanie Cooley, RN;Heather Otho Ket, BS, Exercise Physiologist;Daphyne Hassell Done, RN, BSN    Virtual Visit No    Medication changes reported     No    Fall or balance concerns reported    No    Tobacco Cessation No Change    Warm-up and Cool-down Performed as group-led instruction    Resistance Training Performed Yes    VAD Patient? No    PAD/SET Patient? No      Pain Assessment   Currently in Pain? No/denies    Multiple Pain Sites No             Capillary Blood Glucose: No results found for this or any previous visit (from the past 24 hour(s)).    Social History   Tobacco Use  Smoking Status Former   Packs/day: 0.50   Years: 15.00   Pack years: 7.50   Types: Cigarettes   Quit date: 11/2020   Years since quitting: 0.7  Smokeless Tobacco Never    Goals Met:  Independence with exercise equipment Exercise tolerated well No report of concerns or symptoms today Strength training completed today  Goals Unmet:  Not Applicable  Comments: checkout @ 3:45pm   Dr. Carlyle Dolly is Medical Director for [place]

## 2021-09-15 ENCOUNTER — Encounter (HOSPITAL_COMMUNITY): Payer: BC Managed Care – PPO

## 2021-09-17 ENCOUNTER — Other Ambulatory Visit: Payer: Self-pay | Admitting: Cardiology

## 2021-09-17 ENCOUNTER — Encounter (HOSPITAL_COMMUNITY)
Admission: RE | Admit: 2021-09-17 | Discharge: 2021-09-17 | Disposition: A | Discharge status: Home / Self Care | Payer: BC Managed Care – PPO | Source: Ambulatory Visit | Attending: Cardiology | Admitting: Cardiology

## 2021-09-17 DIAGNOSIS — Z955 Presence of coronary angioplasty implant and graft: Secondary | ICD-10-CM | POA: Diagnosis not present

## 2021-09-17 DIAGNOSIS — I214 Non-ST elevation (NSTEMI) myocardial infarction: Secondary | ICD-10-CM

## 2021-09-17 DIAGNOSIS — K219 Gastro-esophageal reflux disease without esophagitis: Secondary | ICD-10-CM

## 2021-09-17 NOTE — Progress Notes (Signed)
Cardiac Individual Treatment Plan  Patient Details  Name: Monica Bell MRN: [id number] Date of Birth: Jul 10, 1960 Referring Provider:   Flowsheet Row CARDIAC REHAB PHASE II ORIENTATION from 09/03/2021 in [place]  Referring Provider Dr. Virgina Jock       Initial Encounter Date:  Flowsheet Row CARDIAC REHAB PHASE II ORIENTATION from 09/03/2021 in [place]  Date 09/03/21       Visit Diagnosis: Status post coronary artery stent placement  NSTEMI (non-ST elevated myocardial infarction) ([place])  Patient's Home Medications on Admission:  Current Outpatient Medications:    amLODipine (NORVASC) 5 MG tablet, TAKE 1 TABLET BY MOUTH ONCE DAILY. (Patient taking differently: Take 5 mg by mouth daily.), Disp: 30 tablet, Rfl: 0   BRILINTA 90 MG TABS tablet, TAKE 1 TABLET BY MOUTH TWICE DAILY (Patient taking differently: Take 90 mg by mouth 2 (two) times daily.), Disp: 60 tablet, Rfl: 11   Calcium Carbonate-Vitamin D (CALTRATE 600+D PO), Take 1 tablet by mouth daily., Disp: , Rfl:    cetirizine (ZYRTEC) 10 MG tablet, Take 10 mg by mouth daily., Disp: , Rfl:    cholecalciferol (VITAMIN D3) 25 MCG (1000 UNIT) tablet, Take 1,000 Units by mouth daily., Disp: , Rfl:    esomeprazole (NEXIUM) 20 MG capsule, Take 1 capsule (20 mg total) by mouth daily at 12 noon., Disp: 30 capsule, Rfl: 3   GNP ASPIRIN LOW DOSE 81 MG EC tablet, TAKE 1 TABLET BY MOUTH ONCE DAILY. SWALLOW WHOLE. (Patient taking differently: Take 81 mg by mouth daily.), Disp: 90 tablet, Rfl: 1   insulin aspart (NOVOLOG) 100 UNIT/ML injection, Inject 10 Units into the skin 3 (three) times daily before meals., Disp: , Rfl:    Investigational - Study Medication, EMPACT-MI Study  Empagliflozin /or Placebo, Disp: , Rfl:    isosorbide-hydrALAZINE (BIDIL) 20-37.5 MG tablet, Take 1 tablet by mouth 3 (three) times daily. (Patient not taking: Reported on 07/30/2021), Disp: 90 tablet, Rfl: 3   ivabradine  (CORLANOR) 5 MG TABS tablet, Take 1 tablet (5 mg total) by mouth 2 (two) times daily with a meal. (Patient not taking: Reported on 07/30/2021), Disp: 60 tablet, Rfl: 2   levothyroxine (SYNTHROID) 137 MCG tablet, Take 137 mcg by mouth daily., Disp: , Rfl:    magnesium oxide (MAG-OX) 400 MG tablet, Take 400 mg by mouth 3 (three) times a week., Disp: , Rfl:    metFORMIN (GLUCOPHAGE) 1000 MG tablet, Take 1,000 mg by mouth 2 (two) times daily with a meal., Disp: , Rfl:    metoprolol succinate (TOPROL-XL) 50 MG 24 hr tablet, Take 1 tablet (50 mg total) by mouth daily. Take with or immediately following a meal., Disp: 30 tablet, Rfl: 0   nitroGLYCERIN (NITROSTAT) 0.4 MG SL tablet, Place 1 tablet (0.4 mg total) under the tongue every 5 (five) minutes as needed for chest pain., Disp: 30 tablet, Rfl: 3   rosuvastatin (CRESTOR) 40 MG tablet, Take 1 tablet (40 mg total) by mouth daily., Disp: 30 tablet, Rfl: 0   TRESIBA FLEXTOUCH 200 UNIT/ML FlexTouch Pen, Inject 50 Units into the skin daily., Disp: , Rfl:    vitamin B-12 (CYANOCOBALAMIN) 1000 MCG tablet, Take 1,000 mcg by mouth daily., Disp: , Rfl:   Past Medical History: Past Medical History:  Diagnosis Date   Abnormal mammogram    Allergic rhinitis due to pollen 08/01/2019   Arthritis 01/06/2019   osteoarthritis of both knees   BMI 28.0-28.9,adult 08/01/2019   Cystocele, unspecified (CODE)  Diabetes mellitus    GERD (gastroesophageal reflux disease)    Heart murmur    Hyperlipemia, mixed 12/14/2018   Hypertension    Hypothyroidism    Stress incontinence, female    Thyroid disease    Type 2 diabetes mellitus, without long-term current use of insulin ([place]) 01/06/2019   Vitamin D deficiency 01/06/2019    Tobacco Use: Social History   Tobacco Use  Smoking Status Former   Packs/day: 0.50   Years: 15.00   Pack years: 7.50   Types: Cigarettes   Quit date: 11/2020   Years since quitting: 0.8  Smokeless Tobacco Never    Labs: Review  Flowsheet        Latest Ref Rng & Units 12/06/2020 07/15/2021  Labs for ITP Cardiac and Pulmonary Rehab  Cholestrol 0 - 200 mg/dL 201     LDL (calc) 0 - 99 mg/dL 134     HDL-C >40 mg/dL 51     Trlycerides <150 mg/dL 78     Hemoglobin A1c 4.8 - 5.6 % 9.4   11.1    TCO2 22 - 32 mmol/L  24            Capillary Blood Glucose: Lab Results  Component Value Date   GLUCAP 228 (H) 09/10/2021   GLUCAP 217 (H) 09/03/2021   GLUCAP 181 (H) 07/16/2021   GLUCAP 179 (H) 07/16/2021   GLUCAP 215 (H) 07/15/2021    POCT Glucose     Row Name 09/03/21 1508             POCT Blood Glucose   Pre-Exercise 217 mg/dL                Exercise Target Goals: Exercise Program Goal: Individual exercise prescription set using results from initial 6 min walk test and THRR while considering  patient's activity barriers and safety.   Exercise Prescription Goal: Starting with aerobic activity 30 plus minutes a day, 3 days per week for initial exercise prescription. Provide home exercise prescription and guidelines that participant acknowledges understanding prior to discharge.  Activity Barriers & Risk Stratification:  Activity Barriers & Cardiac Risk Stratification - 09/03/21 1329       Activity Barriers & Cardiac Risk Stratification   Activity Barriers Arthritis;Joint Problems    Cardiac Risk Stratification High             6 Minute Walk:  6 Minute Walk     Row Name 09/03/21 1448         6 Minute Walk   Phase Initial     Distance 750 feet     Walk Time 6 minutes     # of Rest Breaks 0     MPH 1.42     METS 2.35     RPE 11     VO2 Peak 8.24     Symptoms Yes (comment)     Comments burning in center of chest 3/10. Resolved with rest     Resting HR 75 bpm     Resting BP 106/54     Resting Oxygen Saturation  98 %     Exercise Oxygen Saturation  during 6 min walk 97 %     Max Ex. HR 76 bpm     Max Ex. BP 112/60     2 Minute Post BP 120/68              Oxygen Initial  Assessment:   Oxygen Re-Evaluation:   Oxygen Discharge (Final Oxygen Re-Evaluation):  Initial Exercise Prescription:  Initial Exercise Prescription - 09/03/21 1400       Date of Initial Exercise RX and Referring Provider   Date 09/03/21    Referring Provider Dr. Virgina Jock    Expected Discharge Date 11/28/21      Treadmill   MPH 1.4    Grade 0    Minutes 17      Recumbant Elliptical   Level 1    RPM 50    Minutes 22      Prescription Details   Frequency (times per week) 3    Duration Progress to 30 minutes of continuous aerobic without signs/symptoms of physical distress      Intensity   THRR 40-80% of Max Heartrate 64-128    Ratings of Perceived Exertion 11-13    Perceived Dyspnea 0-4      Resistance Training   Training Prescription Yes    Weight 2    Reps 10-15             Perform Capillary Blood Glucose checks as needed.  Exercise Prescription Changes:   Exercise Prescription Changes     Row Name 09/12/21 1530             Response to Exercise   Blood Pressure (Admit) 120/60       Blood Pressure (Exercise) 142/68       Blood Pressure (Exit) 120/58       Heart Rate (Admit) 84 bpm       Heart Rate (Exercise) 104 bpm       Heart Rate (Exit) 93 bpm       Rating of Perceived Exertion (Exercise) 13       Duration Continue with 30 min of aerobic exercise without signs/symptoms of physical distress.       Intensity THRR unchanged         Progression   Progression Continue to progress workloads to maintain intensity without signs/symptoms of physical distress.         Resistance Training   Training Prescription Yes       Weight 2       Reps 10-15       Time 10 Minutes         Treadmill   MPH 1.7       Grade 0       Minutes 17       METs 2.3         Recumbant Elliptical   Level 1       RPM 30       Minutes 22       METs 1.3                Exercise Comments:   Exercise Goals and Review:   Exercise Goals     Row Name  09/03/21 1451 09/16/21 0813           Exercise Goals   Increase Physical Activity Yes Yes      Intervention Provide advice, education, support and counseling about physical activity/exercise needs.;Develop an individualized exercise prescription for aerobic and resistive training based on initial evaluation findings, risk stratification, comorbidities and participant's personal goals. Provide advice, education, support and counseling about physical activity/exercise needs.;Develop an individualized exercise prescription for aerobic and resistive training based on initial evaluation findings, risk stratification, comorbidities and participant's personal goals.      Expected Outcomes Short Term: Attend rehab on a regular basis to increase amount of physical activity.;Long Term: Add  in home exercise to make exercise part of routine and to increase amount of physical activity.;Long Term: Exercising regularly at least 3-5 days a week. Short Term: Attend rehab on a regular basis to increase amount of physical activity.;Long Term: Add in home exercise to make exercise part of routine and to increase amount of physical activity.;Long Term: Exercising regularly at least 3-5 days a week.      Increase Strength and Stamina Yes Yes      Intervention Provide advice, education, support and counseling about physical activity/exercise needs.;Develop an individualized exercise prescription for aerobic and resistive training based on initial evaluation findings, risk stratification, comorbidities and participant's personal goals. Provide advice, education, support and counseling about physical activity/exercise needs.;Develop an individualized exercise prescription for aerobic and resistive training based on initial evaluation findings, risk stratification, comorbidities and participant's personal goals.      Expected Outcomes Short Term: Increase workloads from initial exercise prescription for resistance, speed, and  METs.;Short Term: Perform resistance training exercises routinely during rehab and add in resistance training at home;Long Term: Improve cardiorespiratory fitness, muscular endurance and strength as measured by increased METs and functional capacity (6MWT) Short Term: Increase workloads from initial exercise prescription for resistance, speed, and METs.;Short Term: Perform resistance training exercises routinely during rehab and add in resistance training at home;Long Term: Improve cardiorespiratory fitness, muscular endurance and strength as measured by increased METs and functional capacity (6MWT)      Able to understand and use rate of perceived exertion (RPE) scale Yes Yes      Intervention Provide education and explanation on how to use RPE scale Provide education and explanation on how to use RPE scale      Expected Outcomes Short Term: Able to use RPE daily in rehab to express subjective intensity level;Long Term:  Able to use RPE to guide intensity level when exercising independently Short Term: Able to use RPE daily in rehab to express subjective intensity level;Long Term:  Able to use RPE to guide intensity level when exercising independently      Knowledge and understanding of Target Heart Rate Range (THRR) Yes Yes      Intervention Provide education and explanation of THRR including how the numbers were predicted and where they are located for reference Provide education and explanation of THRR including how the numbers were predicted and where they are located for reference      Expected Outcomes Short Term: Able to state/look up THRR;Short Term: Able to use daily as guideline for intensity in rehab;Long Term: Able to use THRR to govern intensity when exercising independently Short Term: Able to state/look up THRR;Short Term: Able to use daily as guideline for intensity in rehab;Long Term: Able to use THRR to govern intensity when exercising independently      Able to check pulse independently  Yes Yes      Intervention Provide education and demonstration on how to check pulse in carotid and radial arteries.;Review the importance of being able to check your own pulse for safety during independent exercise Provide education and demonstration on how to check pulse in carotid and radial arteries.;Review the importance of being able to check your own pulse for safety during independent exercise      Expected Outcomes Short Term: Able to explain why pulse checking is important during independent exercise;Long Term: Able to check pulse independently and accurately Short Term: Able to explain why pulse checking is important during independent exercise;Long Term: Able to check pulse independently and accurately  Understanding of Exercise Prescription Yes Yes      Intervention Provide education, explanation, and written materials on patient's individual exercise prescription Provide education, explanation, and written materials on patient's individual exercise prescription      Expected Outcomes Short Term: Able to explain program exercise prescription;Long Term: Able to explain home exercise prescription to exercise independently Short Term: Able to explain program exercise prescription;Long Term: Able to explain home exercise prescription to exercise independently               Exercise Goals Re-Evaluation :  Exercise Goals Re-Evaluation     [place] Name 09/16/21 0814             Exercise Goal Re-Evaluation   Exercise Goals Review Increase Physical Activity;Increase Strength and Stamina;Able to understand and use rate of perceived exertion (RPE) scale;Knowledge and understanding of Target Heart Rate Range (THRR);Able to check pulse independently;Understanding of Exercise Prescription       Comments Pt has completed 3 sessions of cardiac rehab. She is coming into class motivated to improve her health. She gets tired walking on the treadmill but stays on for the 17 mins. She participates in  the warmup but does not complete some of the exercises due to her R shoulder hurting. She is currently exercising at 2.30 METs on the treadmill. Will continue to monitor and progress as able.       Expected Outcomes Through exercise at rehab and at home, the patient will meet their stated goals.                 Discharge Exercise Prescription (Final Exercise Prescription Changes):  Exercise Prescription Changes - 09/12/21 1530       Response to Exercise   Blood Pressure (Admit) 120/60    Blood Pressure (Exercise) 142/68    Blood Pressure (Exit) 120/58    Heart Rate (Admit) 84 bpm    Heart Rate (Exercise) 104 bpm    Heart Rate (Exit) 93 bpm    Rating of Perceived Exertion (Exercise) 13    Duration Continue with 30 min of aerobic exercise without signs/symptoms of physical distress.    Intensity THRR unchanged      Progression   Progression Continue to progress workloads to maintain intensity without signs/symptoms of physical distress.      Resistance Training   Training Prescription Yes    Weight 2    Reps 10-15    Time 10 Minutes      Treadmill   MPH 1.7    Grade 0    Minutes 17    METs 2.3      Recumbant Elliptical   Level 1    RPM 30    Minutes 22    METs 1.3             Nutrition:  Target Goals: Understanding of nutrition guidelines, daily intake of sodium '1500mg'$ , cholesterol '200mg'$ , calories 30% from fat and 7% or less from saturated fats, daily to have 5 or more servings of fruits and vegetables.  Biometrics:  Pre Biometrics - 09/03/21 1452       Pre Biometrics   Height '5\' 11"'$  (1.803 m)    Weight 82.4 kg    Waist Circumference 40 inches    Hip Circumference 40 inches    Waist to Hip Ratio 1 %    BMI (Calculated) 25.35    Triceps Skinfold 36 mm    % Body Fat 39.5 %    Grip Strength 21.4 kg  Flexibility 0 in    Single Leg Stand 6.08 seconds              Nutrition Therapy Plan and Nutrition Goals:  Nutrition Therapy & Goals -  09/03/21 1331       Intervention Plan   Intervention Nutrition handout(s) given to patient.    Expected Outcomes Short Term Goal: Understand basic principles of dietary content, such as calories, fat, sodium, cholesterol and nutrients.             Nutrition Assessments:  Nutrition Assessments - 09/03/21 1332       MEDFICTS Scores   Pre Score 25            MEDIFICTS Score Key: ?70 Need to make dietary changes  40-70 Heart Healthy Diet ? 40 Therapeutic Level Cholesterol Diet  Flowsheet Row Cardiac Rehab from 01/30/2021 in [facility] Cardiac and Pulmonary Rehab  Picture Your Plate Total Score on Admission 51      Picture Your Plate Scores: <25 Unhealthy dietary pattern with much room for improvement. 41-50 Dietary pattern unlikely to meet recommendations for good health and room for improvement. 51-60 More healthful dietary pattern, with some room for improvement.  >60 Healthy dietary pattern, although there may be some specific behaviors that could be improved.    Nutrition Goals Re-Evaluation:   Nutrition Goals Discharge (Final Nutrition Goals Re-Evaluation):   Psychosocial: Target Goals: Acknowledge presence or absence of significant depression and/or stress, maximize coping skills, provide positive support system. Participant is able to verbalize types and ability to use techniques and skills needed for reducing stress and depression.  Initial Review & Psychosocial Screening:  Initial Psych Review & Screening - 09/03/21 1331       Initial Review   Current issues with None Identified      Family Dynamics   Good Support System? Yes    Comments Her daughter and her sister are her main support system.      Barriers   Psychosocial barriers to participate in program There are no identifiable barriers or psychosocial needs.;The patient should benefit from training in stress management and relaxation.      Screening Interventions   Interventions Encouraged to  exercise;Provide feedback about the scores to participant;To provide support and resources with identified psychosocial needs    Expected Outcomes Long Term goal: The participant improves quality of Life and PHQ9 Scores as seen by post scores and/or verbalization of changes;Short Term goal: Identification and review with participant of any Quality of Life or Depression concerns found by scoring the questionnaire.             Quality of Life Scores:  Quality of Life - 09/03/21 1452       Quality of Life   Select Quality of Life      Quality of Life Scores   Health/Function Pre 18.11 %    Socioeconomic Pre 23.79 %    Psych/Spiritual Pre 20.57 %    Family Pre 20.13 %    GLOBAL Pre 20.14 %            Scores of 19 and below usually indicate a poorer quality of life in these areas.  A difference of  2-3 points is a clinically meaningful difference.  A difference of 2-3 points in the total score of the Quality of Life Index has been associated with significant improvement in overall quality of life, self-image, physical symptoms, and general health in studies assessing change in quality of life.  PHQ-9: Review Flowsheet        09/03/2021 03/17/2021 03/03/2021 01/30/2021  Depression screen PHQ 2/9  Decreased Interest 1 0 2 1  Down, Depressed, Hopeless 0 0 0 1  PHQ - 2 Score 1 0 2 2  Altered sleeping '1 1 3 2  '$ Tired, decreased energy '1 1 2 3  '$ Change in appetite 0 '1 3 1  '$ Feeling bad or failure about yourself  0 0 0 0  Trouble concentrating 1 0 1   Moving slowly or fidgety/restless 0 0 0 2  Suicidal thoughts 0 0 0 0  PHQ-9 Score '4 3 11 10  '$ Difficult doing work/chores Somewhat difficult Somewhat difficult Somewhat difficult Somewhat difficult         Interpretation of Total Score  Total Score Depression Severity:  1-4 = Minimal depression, 5-9 = Mild depression, 10-14 = Moderate depression, 15-19 = Moderately severe depression, 20-27 = Severe depression   Psychosocial  Evaluation and Intervention:  Psychosocial Evaluation - 09/03/21 1453       Psychosocial Evaluation & Interventions   Interventions Encouraged to exercise with the program and follow exercise prescription    Comments Pt has no barriers to participating in CR. She has no identifiable psychosocial issues. 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 She participated in [facility] after this, and it did help some, but she reports she never got back to her baseline. She then went down hill again when she received another stent earlier this year. She reports that she has a good support system with her daughter and her sister. She has lost weight in the last few months and reports making several dietary changes in order to have better control of her heart disease and her diabetes. 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 She states that the job is just too physical for her. She was not interested in Vocational Rehab though. Her goals while in the program are to improve her strength and stamina. She is eager to start the program.    Expected Outcomes Pt will continue to have no identifiable psychosocial issues.    Continue Psychosocial Services  No Follow up required             Psychosocial Re-Evaluation:  Psychosocial Re-Evaluation     [place] Name 09/08/21 1010             Psychosocial Re-Evaluation   Current issues with None Identified       Comments Patient plans to start the program today. We will continue to monitor her psychosocial progress.       Expected Outcomes Patient will continue to have no psychosocial barriers identified.       Interventions Stress management education;Relaxation education;Encouraged to attend Cardiac Rehabilitation for the exercise       Continue Psychosocial  Services  No Follow up required                Psychosocial Discharge (Final Psychosocial Re-Evaluation):  Psychosocial Re-Evaluation - 09/08/21 1010       Psychosocial Re-Evaluation   Current issues with None Identified    Comments Patient plans to start the program today. We will continue to monitor her psychosocial progress.    Expected Outcomes Patient will continue to  have no psychosocial barriers identified.    Interventions Stress management education;Relaxation education;Encouraged to attend Cardiac Rehabilitation for the exercise    Continue Psychosocial Services  No Follow up required             Vocational Rehabilitation: Provide vocational rehab assistance to qualifying candidates.   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             Education: Education Goals: Education classes will be provided on a weekly basis, covering required topics. Participant will state understanding/return demonstration of topics presented.  Learning Barriers/Preferences:  Learning Barriers/Preferences - 09/03/21 1334       Learning Barriers/Preferences   Learning Barriers None    Learning Preferences Written Material;Skilled Demonstration;Individual Instruction             Education Topics: Hypertension, Hypertension Reduction -Define heart disease and high blood pressure. Discus how high blood pressure affects the body and ways to reduce high blood pressure.   Exercise and Your Heart -Discuss why it is important to exercise, the FITT principles of exercise, normal and abnormal responses to exercise, and how to exercise  safely.   Angina -Discuss definition of angina, causes of angina, treatment of angina, and how to decrease risk of having angina.   Cardiac Medications -Review what the following cardiac medications are used for, how they affect the body, and side effects that may occur when taking the medications.  Medications include Aspirin, Beta blockers, calcium channel blockers, ACE Inhibitors, angiotensin receptor blockers, diuretics, digoxin, and antihyperlipidemics.   Congestive Heart Failure -Discuss the definition of CHF, how to live with CHF, the signs and symptoms of CHF, and how keep track of weight and sodium intake.   Heart Disease and Intimacy -Discus the effect sexual activity has on the heart, how changes occur during intimacy as we age, and safety during sexual activity.   Smoking Cessation / COPD -Discuss different methods to quit smoking, the health benefits of quitting smoking, and the definition of COPD. Flowsheet Row CARDIAC REHAB PHASE II EXERCISE from 09/10/2021 in [place]  Date 09/10/21  Educator DF  Instruction Review Code 1- Verbalizes Understanding       Nutrition I: Fats -Discuss the types of cholesterol, what cholesterol does to the heart, and how cholesterol levels can be controlled.   Nutrition II: Labels -Discuss the different components of food labels and how to read food label   Heart Parts/Heart Disease and PAD -Discuss the anatomy of the heart, the pathway of blood circulation through the heart, and these are affected by heart disease.   Stress I: Signs and Symptoms -Discuss the causes of stress, how stress may lead to anxiety and depression, and ways to limit stress.   Stress II: Relaxation -Discuss different types of relaxation techniques to limit stress.   Warning Signs of Stroke / TIA -Discuss definition of a stroke, what the signs and symptoms are of a stroke, and how to identify when someone is having  stroke.   Knowledge Questionnaire Score:  Knowledge Questionnaire Score - 09/03/21 1335       Knowledge Questionnaire Score   Pre Score  23/28             Core Components/Risk Factors/Patient Goals at Admission:  Personal Goals and Risk Factors at Admission - 09/03/21 1338       Core Components/Risk Factors/Patient Goals on Admission   Diabetes Yes    Intervention Provide education about signs/symptoms and action to take for hypo/hyperglycemia.;Provide education about proper nutrition, including hydration, and aerobic/resistive exercise prescription along with prescribed medications to achieve blood glucose in normal ranges: Fasting glucose 65-99 mg/dL    Expected Outcomes Short Term: Participant verbalizes understanding of the signs/symptoms and immediate care of hyper/hypoglycemia, proper foot care and importance of medication, aerobic/resistive exercise and nutrition plan for blood glucose control.;Long Term: Attainment of HbA1C < 7%.    Hypertension Yes    Intervention Provide education on lifestyle modifcations including regular physical activity/exercise, weight management, moderate sodium restriction and increased consumption of fresh fruit, vegetables, and low fat dairy, alcohol moderation, and smoking cessation.;Monitor prescription use compliance.    Expected Outcomes Short Term: Continued assessment and intervention until BP is < 140/44m HG in hypertensive participants. < 130/871mHG in hypertensive participants with diabetes, heart failure or chronic kidney disease.;Long Term: Maintenance of blood pressure at goal levels.    Lipids Yes    Intervention Provide education and support for participant on nutrition & aerobic/resistive exercise along with prescribed medications to achieve LDL '70mg'$ , HDL >'40mg'$ .    Expected Outcomes Short Term: Participant states understanding of desired cholesterol values and is compliant with medications prescribed. Participant is following exercise  prescription and nutrition guidelines.;Long Term: Cholesterol controlled with medications as prescribed, with individualized exercise RX and with personalized nutrition plan. Value goals: LDL < '70mg'$ , HDL > 40 mg.    Personal Goal Other Yes    Personal Goal Improve strength and stamina    Intervention Attend CR twice per week and begin a home exercise program atleast two days per week.    Expected Outcomes Pt will report improvements in her strength and stamina, and she will be able to increase her walk test distance.             Core Components/Risk Factors/Patient Goals Review:   Goals and Risk Factor Review     Row Name 09/08/21 1013             Core Components/Risk Factors/Patient Goals Review   Personal Goals Review Diabetes;Hypertension;Lipids;Other       Review Patient referred to CR with Stent placement. She has multiple risk factors for CAD and is participating in the program for risk modification. She plans to start the program today. Her last A1C was 07/15/21 at 11.1% up from 9.4% 9 months ago. Her DM is managed with Insulin, Tresiba, and Metformin. Her personal goals for the program are to improve her strenght and stamina. We will continue to monitor her progress as she works towards meeting these goals.       Expected Outcomes Patient will complete the program meeting both personal and program goals.                Core Components/Risk Factors/Patient Goals at Discharge (Final Review):   Goals and Risk Factor Review - 09/08/21 1013       Core Components/Risk Factors/Patient Goals Review   Personal Goals Review Diabetes;Hypertension;Lipids;Other    Review Patient referred to CR with Stent placement. She has multiple risk factors for CAD and is participating in the program for risk modification. She plans to start the program today. Her last  A1C was 07/15/21 at 11.1% up from 9.4% 9 months ago. Her DM is managed with Insulin, Tresiba, and Metformin. Her personal goals for  the program are to improve her strenght and stamina. We will continue to monitor her progress as she works towards meeting these goals.    Expected Outcomes Patient will complete the program meeting both personal and program goals.             ITP Comments:   Comments: ITP REVIEW Pt is making expected progress toward Cardiac Rehab goals after completing 3 sessions. Recommend continued exercise, life style modification, education, and increased stamina and strength.

## 2021-09-17 NOTE — Progress Notes (Signed)
Daily Session Note  Patient Details  Name: Monica Bell MRN: [id number] Date of Birth: November 16, 1960 Referring Provider:   Flowsheet Row CARDIAC REHAB PHASE II ORIENTATION from 09/03/2021 in [place]  Referring Provider Dr. Virgina Jock       Encounter Date: 09/17/2021  Check In:  Session Check In - 09/17/21 1439       Check-In   Supervising physician immediately available to respond to emergencies [facility] MD immediately available    Physician(s) Dr Domenic Polite    Location [facility]    Staff Present Redge Gainer, BS, Exercise Physiologist;Chyenne Sobczak Hassell Done, RN, Jennye Moccasin, RN, BSN    Virtual Visit No    Medication changes reported     No    Fall or balance concerns reported    No    Tobacco Cessation No Change    Warm-up and Cool-down Performed as group-led instruction    Resistance Training Performed Yes    VAD Patient? No    PAD/SET Patient? No      Pain Assessment   Currently in Pain? No/denies    Multiple Pain Sites No             Capillary Blood Glucose: No results found for this or any previous visit (from the past 24 hour(s)).    Social History   Tobacco Use  Smoking Status Former   Packs/day: 0.50   Years: 15.00   Pack years: 7.50   Types: Cigarettes   Quit date: 11/2020   Years since quitting: 0.8  Smokeless Tobacco Never    Goals Met:  Independence with exercise equipment Exercise tolerated well No report of concerns or symptoms today Strength training completed today  Goals Unmet:  Not Applicable  Comments: checkout at 1545.   Dr. Carlyle Dolly is Medical Director for [facility] Cardiac Rehab

## 2021-09-19 ENCOUNTER — Encounter (HOSPITAL_COMMUNITY): Payer: BC Managed Care – PPO

## 2021-09-22 ENCOUNTER — Encounter (HOSPITAL_COMMUNITY): Payer: BC Managed Care – PPO

## 2021-09-24 ENCOUNTER — Encounter (HOSPITAL_COMMUNITY)
Admission: RE | Admit: 2021-09-24 | Discharge: 2021-09-24 | Disposition: A | Discharge status: Home / Self Care | Payer: BC Managed Care – PPO | Source: Ambulatory Visit | Attending: Cardiology | Admitting: Cardiology

## 2021-09-24 DIAGNOSIS — I214 Non-ST elevation (NSTEMI) myocardial infarction: Secondary | ICD-10-CM

## 2021-09-24 DIAGNOSIS — Z955 Presence of coronary angioplasty implant and graft: Secondary | ICD-10-CM

## 2021-09-24 NOTE — Progress Notes (Signed)
Daily Session Note  Patient Details  Name: Monica Bell MRN: [id number] Date of Birth: Aug 14, 1960 Referring Provider:   Flowsheet Row CARDIAC REHAB PHASE II ORIENTATION from 09/03/2021 in [place]  Referring Provider Dr. Virgina Jock       Encounter Date: 09/24/2021  Check In:  Session Check In - 09/24/21 1445       Check-In   Supervising physician immediately available to respond to emergencies [facility] MD immediately available    Physician(s) Dr Harl Bowie    Staff Present Aundra Dubin, RN, Joanette Gula, RN, Bjorn Loser, MS, ACSM-CEP, Exercise Physiologist    Virtual Visit No    Medication changes reported     No    Fall or balance concerns reported    No    Tobacco Cessation No Change    Warm-up and Cool-down Performed as group-led instruction    Resistance Training Performed Yes    VAD Patient? No    PAD/SET Patient? No      Pain Assessment   Currently in Pain? No/denies    Multiple Pain Sites No             Capillary Blood Glucose: No results found for this or any previous visit (from the past 24 hour(s)).    Social History   Tobacco Use  Smoking Status Former   Packs/day: 0.50   Years: 15.00   Pack years: 7.50   Types: Cigarettes   Quit date: 11/2020   Years since quitting: 0.8  Smokeless Tobacco Never    Goals Met:  Independence with exercise equipment Exercise tolerated well No report of concerns or symptoms today Strength training completed today  Goals Unmet:  Not Applicable  Comments: checkout at 1545.   Dr. Carlyle Dolly is Medical Director for [facility] Cardiac Rehab

## 2021-09-26 ENCOUNTER — Encounter (HOSPITAL_COMMUNITY): Payer: BC Managed Care – PPO

## 2021-09-29 ENCOUNTER — Encounter (HOSPITAL_COMMUNITY)
Admission: RE | Admit: 2021-09-29 | Discharge: 2021-09-29 | Disposition: A | Discharge status: Home / Self Care | Payer: BC Managed Care – PPO | Source: Ambulatory Visit | Attending: Cardiology | Admitting: Cardiology

## 2021-09-29 VITALS — Wt 183.9 lb

## 2021-09-29 DIAGNOSIS — Z955 Presence of coronary angioplasty implant and graft: Secondary | ICD-10-CM | POA: Insufficient documentation

## 2021-09-29 DIAGNOSIS — I214 Non-ST elevation (NSTEMI) myocardial infarction: Secondary | ICD-10-CM | POA: Insufficient documentation

## 2021-09-29 NOTE — Progress Notes (Signed)
Daily Session Note  Patient Details  Name: Monica Bell MRN: [id number] Date of Birth: Dec 29, 1960 Referring Provider:   Flowsheet Row CARDIAC REHAB PHASE II ORIENTATION from 09/03/2021 in [place]  Referring Provider Dr. Virgina Jock       Encounter Date: 09/29/2021  Check In:  Session Check In - 09/29/21 1444       Check-In   Supervising physician immediately available to respond to emergencies [facility] MD immediately available    Physician(s) Dr. Marlou Porch    Location [facility]    Staff Present Geanie Cooley, RN;Heather Otho Ket, BS, Exercise Physiologist;Dalton Kris Mouton, MS, ACSM-CEP, Exercise Physiologist    Virtual Visit No    Medication changes reported     No    Fall or balance concerns reported    No    Tobacco Cessation No Change    Warm-up and Cool-down Performed as group-led instruction    Resistance Training Performed Yes    VAD Patient? No    PAD/SET Patient? No      Pain Assessment   Currently in Pain? No/denies    Multiple Pain Sites No             Capillary Blood Glucose: No results found for this or any previous visit (from the past 24 hour(s)).    Social History   Tobacco Use  Smoking Status Former   Packs/day: 0.50   Years: 15.00   Pack years: 7.50   Types: Cigarettes   Quit date: 11/2020   Years since quitting: 0.8  Smokeless Tobacco Never    Goals Met:  Independence with exercise equipment Exercise tolerated well No report of concerns or symptoms today Strength training completed today  Goals Unmet:  Not Applicable  Comments: check out @ 3:45pm   Dr. Carlyle Dolly is Medical Director for [place]

## 2021-10-01 ENCOUNTER — Encounter (HOSPITAL_COMMUNITY): Payer: BC Managed Care – PPO

## 2021-10-01 ENCOUNTER — Encounter: Payer: Self-pay | Admitting: Cardiology

## 2021-10-01 ENCOUNTER — Ambulatory Visit: Payer: BC Managed Care – PPO | Admitting: Cardiology

## 2021-10-01 VITALS — BP 162/77 | HR 92 | Temp 97.3°F | Resp 16 | Ht 71.0 in | Wt 182.0 lb

## 2021-10-01 DIAGNOSIS — E1165 Type 2 diabetes mellitus with hyperglycemia: Secondary | ICD-10-CM

## 2021-10-01 DIAGNOSIS — I25118 Atherosclerotic heart disease of native coronary artery with other forms of angina pectoris: Secondary | ICD-10-CM

## 2021-10-01 DIAGNOSIS — I5020 Unspecified systolic (congestive) heart failure: Secondary | ICD-10-CM

## 2021-10-01 DIAGNOSIS — I10 Essential (primary) hypertension: Secondary | ICD-10-CM

## 2021-10-01 MED ORDER — CARVEDILOL 12.5 MG PO TABS: 12.5000 mg | ORAL_TABLET | Freq: Two times a day (BID) | ORAL | 3 refills | Status: DC

## 2021-10-01 NOTE — Progress Notes (Signed)
Patient referred by [name], NP for coronary artery disease  Subjective:   Monica Bell, female    DOB: 08-19-1960, 61 y.o.   MRN: [id number]  Chief Complaint  Patient presents with   Follow-up    FMLA    HPI  61 year old African-American female with hypertension, hyperlipidemia, type 2 diabetes mellitus, tobacco dependence, CAD, HFrEF-now recovered EF, GERD  Patient underwent successful mid Lcx CTO PCI (06/2021). Chest tightness symptoms are significantly reduced. She now only feels occasional chest tightness with more than usual exertion. She has not had to take SL NTG. She still does not feel that she can perform the level pf physical activity she was doing before at work, owing to her occasional angina symptoms.   Blood pressure is elevated. She has follow up with her endocrinologist tomorrow.     Current Outpatient Medications:    amLODipine (NORVASC) 5 MG tablet, TAKE 1 TABLET BY MOUTH ONCE DAILY. (Patient taking differently: Take 5 mg by mouth daily.), Disp: 30 tablet, Rfl: 0   BRILINTA 90 MG TABS tablet, TAKE 1 TABLET BY MOUTH TWICE DAILY (Patient taking differently: Take 90 mg by mouth 2 (two) times daily.), Disp: 60 tablet, Rfl: 11   Calcium Carbonate-Vitamin D (CALTRATE 600+D PO), Take 1 tablet by mouth daily., Disp: , Rfl:    cetirizine (ZYRTEC) 10 MG tablet, Take 10 mg by mouth daily., Disp: , Rfl:    cholecalciferol (VITAMIN D3) 25 MCG (1000 UNIT) tablet, Take 1,000 Units by mouth daily., Disp: , Rfl:    esomeprazole (NEXIUM) 20 MG capsule, TAKE 1 CAPSULE BY MOUTH ONCE DAILY AT 12 NOON., Disp: 30 capsule, Rfl: 0   GNP ASPIRIN LOW DOSE 81 MG EC tablet, TAKE 1 TABLET BY MOUTH ONCE DAILY. SWALLOW WHOLE. (Patient taking differently: Take 81 mg by mouth daily.), Disp: 90 tablet, Rfl: 1   insulin aspart (NOVOLOG) 100 UNIT/ML injection, Inject 10 Units into the skin 3 (three) times daily before meals., Disp: , Rfl:    Investigational - Study Medication,  EMPACT-MI Study  Empagliflozin /or Placebo, Disp: , Rfl:    isosorbide-hydrALAZINE (BIDIL) 20-37.5 MG tablet, Take 1 tablet by mouth 3 (three) times daily. (Patient not taking: Reported on 07/30/2021), Disp: 90 tablet, Rfl: 3   ivabradine (CORLANOR) 5 MG TABS tablet, Take 1 tablet (5 mg total) by mouth 2 (two) times daily with a meal. (Patient not taking: Reported on 07/30/2021), Disp: 60 tablet, Rfl: 2   levothyroxine (SYNTHROID) 137 MCG tablet, Take 137 mcg by mouth daily., Disp: , Rfl:    magnesium oxide (MAG-OX) 400 MG tablet, Take 400 mg by mouth 3 (three) times a week., Disp: , Rfl:    metFORMIN (GLUCOPHAGE) 1000 MG tablet, Take 1,000 mg by mouth 2 (two) times daily with a meal., Disp: , Rfl:    metoprolol succinate (TOPROL-XL) 50 MG 24 hr tablet, Take 1 tablet (50 mg total) by mouth daily. Take with or immediately following a meal., Disp: 30 tablet, Rfl: 0   nitroGLYCERIN (NITROSTAT) 0.4 MG SL tablet, Place 1 tablet (0.4 mg total) under the tongue every 5 (five) minutes as needed for chest pain., Disp: 30 tablet, Rfl: 3   rosuvastatin (CRESTOR) 40 MG tablet, Take 1 tablet (40 mg total) by mouth daily., Disp: 30 tablet, Rfl: 0   TRESIBA FLEXTOUCH 200 UNIT/ML FlexTouch Pen, Inject 50 Units into the skin daily., Disp: , Rfl:    vitamin B-12 (CYANOCOBALAMIN) 1000 MCG tablet, Take 1,000 mcg by mouth daily.,  Disp: , Rfl:   Cardiovascular and other pertinent studies:  EKG 07/30/2021: Sinus rhythm 67 bpm Low voltage, otherwise normal EKG  Coronary intervention 07/15/2021: LM: Mod 40% disease with MLA 7-9 mm2 LAD: Minimal diffuse disease Lcx: Prox 40-50% disease        Mid Lcx CTO        Patent OM1 stent with no restenosis RCA: Mid 40% disease          Grade 2 collaterals to occluded Lcx   LVEDP normal   (IVUS) guided successful percutaneous coronary intervention mid Lcx (CTO intervention)     PTCA and stent placement 2.25 X 26 mm Onyx Frontier drug-eluting stent     Proximal optimization with  2.75X15 mm Nelson balloon at 18 atm     Kissing balloon post stenting angioplasty Lcx/OM1 w/2.75X15 mm and 3.0X8 mm [place] balloons at 12    0% residual stenosis Excellent stent apposition and expansion and ostial mid Lcx coverage                          Echocardiogram 03/18/2021:  Normal LV systolic function with visual EF 60-65%. Left ventricle cavity  is normal in size. Moderate left ventricular hypertrophy. Normal global  wall motion. Indeterminate diastolic filling pattern, indeterminate LAP.  No significant valvular disease.  Small pericardial effusion. There is no hemodynamic significance.  Compared to study 12/06/2020: LVEF improved from 40-45% to 60-65%  otherwise no significant change.   Coronary intervention 12/06/2020: LM: Normal LAD: No significant disease Lcx: Prox thrombotic 95% stenosis, 60% ostial OM1 stenosis        Mid Lcx occlusion, likely chronic RCA: Mid 60% disease. Right-to-left collaterals to occluded Lcx   Successful percutaneous coronary intervention prox Lcx-OM1        PTCA and stent placement 3.0 X 30 mm Onyx Frontier drug-eluting  stent, post dilatation with 3.25X8 and 3.5X15 mm [place] balloons up to 20 atm   Distal embolization into small branches of OM1. Aggrastat bolus and infusion started.   Recent labs: 06/27/2021: Glucose 318, BUN/Cr 9/1.26. EGFR 49. Na/K 138/4.5. Rest of the CMP normal H/H 10.6/34.9. MCV 85. Platelets 350 HbA1C 11.5% Chol 75, TG 82, HDL 344, LDL 15 TSH 0.03 very low Free T4 1.6 normal  01/02/2021: (After discontiuing Entresto) Glucose 269, BUN/Cr 9/1.26. EGFR 49. Na/K 139/4.1.   12/24/2020: (On Entresto 24-26 mg bid) Glucose 243, BUN/Cr 11/1.82. EGFR 32. Na/K 132/3.8.   12/16/2020: (On Entresto 49-51 mg bid) Glucose 195, BUN/Cr 11/1.47. EGFR 41. Na/K 139/4.1.   12/07/2020: (On Entresto 24-26 mg bid) Glucose 180, BUN/Cr 10/0.87. EGFR >60. Na/K 136/4.4.  H/H 11.7/35.7. MCV 87. Platelets 370 HbA1C 9.4% Chol 201, TG 78, HDL  51, LDL 134 TSH N/A  Results for NAHJAE, HOEG (MRN [id number]) as of 12/10/2020 11:10  Ref. Range 12/06/2020 01:39 12/06/2020 05:03 12/06/2020 07:46 12/06/2020 10:07  Troponin I (High Sensitivity) Latest Ref Range: <18 ng/L 149 (HH) 396 (HH) 734 (HH) 1,050 (HH)     Review of Systems  Constitutional: Positive for malaise/fatigue.  Cardiovascular:  Positive for chest pain (Different from her angina at the time of MI). Negative for dyspnea on exertion, leg swelling, palpitations and syncope.       There were no vitals filed for this visit.    There is no height or weight on file to calculate BMI. There were no vitals filed for this visit.    Objective:   Physical Exam Vitals and nursing  note reviewed.  Constitutional:      General: She is not in acute distress. Neck:     Vascular: No JVD.  Cardiovascular:     Rate and Rhythm: Normal rate and regular rhythm.     Pulses: Decreased pulses.     Heart sounds: Normal heart sounds. No murmur heard. Pulmonary:     Effort: Pulmonary effort is normal.     Breath sounds: Normal breath sounds. No wheezing or rales.  Musculoskeletal:     Right lower leg: No edema.     Left lower leg: No edema.          ICD-10-CM   1. Coronary artery disease of native artery of native heart with stable angina pectoris (HCC)  I25.118 carvedilol (COREG) 12.5 MG tablet    2. HFrEF (heart failure with reduced ejection fraction) (HCC)  I50.20 carvedilol (COREG) 12.5 MG tablet    3. Primary hypertension  I10 carvedilol (COREG) 12.5 MG tablet    4. Uncontrolled type 2 diabetes mellitus with hyperglycemia (HCC)  E11.65        Assessment & Recommendations:   61 year old African-American female with hypertension, hyperlipidemia, type 2 diabetes mellitus, tobacco dependence, CAD, HFrEF-now recovered EF, GERD   CAD: NSTEMI 11/2020. Culprit: thrombotic 95% stenosis Prox Lcx into large OM1 (11/2020) Non-culprit: Mid Lcx chronic occlusion with R-to-L  grade 3 collaterals Now s/p CTO PCI to mid Lcx (06/2021) Moderate LM and mid RCA disease Recommend DAPT with Aspirin and Brilinta till 12/2021 Angina symptoms are significantly reduced, but not eliminated. She may need further reduction of work duties. Continue amlodipine 5 mg daily. Change metoprolol to coreg 12.5 mg bid. Continue Crestor and Praluent. LDL down to 15. (06/2021) Needs aggressive diabetes management, A1C 11% (06/2021)  HFrEF: Clinically euvolumic.  LVEF recovered. Entresto discontinued due to AKI Given recovered EF and fatigue due to metoprolol, changing metoprolol to coreg 12.5 mg bid. Continue Bidil 20-37.5 mg tid. Encourage hydration. Continue Corlanor 5 mg bid. Enrolled in EMPACT-MI clinical trial (Empaglifozin vs placebo in post MI heart failure patients).  Consider using non-SGLT2i for diabetes management.   Hypertension: Uncontrolled Changed metoprolol succinate 50 mg daily to labetalol 200 mg bid. Hopefully, this could help with her low energy symptoms as well.    Mixed hyperlipidemia: LDL down to 15 on Praluent.    Type 2 DM: Uncontrolled. Defer management to primary team.  This remains extremely critical to management of her coronary artery disease.  Strongly recommend close follow-up with PCP. Enrolled in EMPACT-MI clinical trial (Empaglifozin vs placebo in post MI heart failure patients).  Consider using non-SGLT2i for diabetes management.    F/u in 3 months   Nigel Mormon, MD Pager: [phone] Office: [phone]

## 2021-10-03 ENCOUNTER — Encounter (HOSPITAL_COMMUNITY): Payer: BC Managed Care – PPO

## 2021-10-03 ENCOUNTER — Ambulatory Visit: Payer: BC Managed Care – PPO | Admitting: Cardiology

## 2021-10-06 ENCOUNTER — Encounter (HOSPITAL_COMMUNITY)
Admission: RE | Admit: 2021-10-06 | Discharge: 2021-10-06 | Disposition: A | Discharge status: Home / Self Care | Payer: BC Managed Care – PPO | Source: Ambulatory Visit | Attending: Cardiology | Admitting: Cardiology

## 2021-10-06 DIAGNOSIS — Z955 Presence of coronary angioplasty implant and graft: Secondary | ICD-10-CM

## 2021-10-06 DIAGNOSIS — I214 Non-ST elevation (NSTEMI) myocardial infarction: Secondary | ICD-10-CM

## 2021-10-06 NOTE — Progress Notes (Signed)
Daily Session Note  Patient Details  Name: Monica Bell MRN: [id number] Date of Birth: 1960-06-17 Referring Provider:   Flowsheet Row CARDIAC REHAB PHASE II ORIENTATION from 09/03/2021 in [place]  Referring Provider Dr. Virgina Jock       Encounter Date: 10/06/2021  Check In:  Session Check In - 10/06/21 1445       Check-In   Supervising physician immediately available to respond to emergencies [facility] MD immediately available    Physician(s) [place]    Location [facility]    Staff Present Aundra Dubin, RN, BSN;Heather Otho Ket, BS, Exercise Physiologist    Virtual Visit No    Medication changes reported     Yes    Comments Dr. Virgina Jock discontinued Metoprolol and added coreg 12.5 mg bid and Labetalol 200 mg bid.    Fall or balance concerns reported    No    Tobacco Cessation No Change    Warm-up and Cool-down Performed as group-led instruction    Resistance Training Performed Yes    VAD Patient? No    PAD/SET Patient? No      Pain Assessment   Currently in Pain? No/denies    Multiple Pain Sites No             Capillary Blood Glucose: No results found for this or any previous visit (from the past 24 hour(s)).    Social History   Tobacco Use  Smoking Status Former   Packs/day: 0.50   Years: 15.00   Total pack years: 7.50   Types: Cigarettes   Quit date: 11/2020   Years since quitting: 0.8  Smokeless Tobacco Never    Goals Met:  Independence with exercise equipment Personal goals reviewed No report of concerns or symptoms today Strength training completed today  Goals Unmet:  Not Applicable  Comments: Check out 1545.   Dr. Carlyle Dolly is Medical Director for [facility] Cardiac Rehab

## 2021-10-08 ENCOUNTER — Encounter (HOSPITAL_COMMUNITY)
Admission: RE | Admit: 2021-10-08 | Discharge: 2021-10-08 | Disposition: A | Discharge status: Home / Self Care | Payer: BC Managed Care – PPO | Source: Ambulatory Visit | Attending: Cardiology | Admitting: Cardiology

## 2021-10-08 DIAGNOSIS — Z955 Presence of coronary angioplasty implant and graft: Secondary | ICD-10-CM

## 2021-10-08 DIAGNOSIS — I214 Non-ST elevation (NSTEMI) myocardial infarction: Secondary | ICD-10-CM

## 2021-10-08 NOTE — Progress Notes (Signed)
Daily Session Note  Patient Details  Name: Monica Bell MRN: [id number] Date of Birth: 1960-10-03 Referring Provider:   Flowsheet Row CARDIAC REHAB PHASE II ORIENTATION from 09/03/2021 in [place]  Referring Provider Dr. Virgina Jock       Encounter Date: 10/08/2021  Check In:  Session Check In - 10/08/21 1439       Check-In   Supervising physician immediately available to respond to emergencies [facility] MD immediately available    Physician(s) Dr. Radford Pax    Location [facility]    Staff Present Redge Gainer, BS, Exercise Physiologist;Svea Pusch Wynetta Emery, RN, BSN    Virtual Visit No    Medication changes reported     No    Fall or balance concerns reported    No    Tobacco Cessation No Change    Warm-up and Cool-down Performed as group-led instruction    Resistance Training Performed Yes    VAD Patient? No    PAD/SET Patient? No      Pain Assessment   Currently in Pain? No/denies    Multiple Pain Sites No             Capillary Blood Glucose: No results found for this or any previous visit (from the past 24 hour(s)).    Social History   Tobacco Use  Smoking Status Former   Packs/day: 0.50   Years: 15.00   Total pack years: 7.50   Types: Cigarettes   Quit date: 11/2020   Years since quitting: 0.8  Smokeless Tobacco Never    Goals Met:  Independence with exercise equipment Exercise tolerated well No report of concerns or symptoms today Strength training completed today  Goals Unmet:  Not Applicable  Comments: Check out 1545.   Dr. Carlyle Dolly is Medical Director for [facility] Cardiac Rehab

## 2021-10-10 ENCOUNTER — Encounter (HOSPITAL_COMMUNITY): Payer: BC Managed Care – PPO

## 2021-10-13 ENCOUNTER — Encounter (HOSPITAL_COMMUNITY): Payer: BC Managed Care – PPO

## 2021-10-15 ENCOUNTER — Encounter (HOSPITAL_COMMUNITY)
Admission: RE | Admit: 2021-10-15 | Discharge: 2021-10-15 | Disposition: A | Discharge status: Home / Self Care | Payer: BC Managed Care – PPO | Source: Ambulatory Visit | Attending: Cardiology | Admitting: Cardiology

## 2021-10-15 DIAGNOSIS — I214 Non-ST elevation (NSTEMI) myocardial infarction: Secondary | ICD-10-CM

## 2021-10-15 DIAGNOSIS — Z955 Presence of coronary angioplasty implant and graft: Secondary | ICD-10-CM

## 2021-10-15 NOTE — Progress Notes (Signed)
Daily Session Note  Patient Details  Name: Monica Bell MRN: [id number] Date of Birth: 28-Dec-1960 Referring Provider:   Flowsheet Row CARDIAC REHAB PHASE II ORIENTATION from 09/03/2021 in [place]  Referring Provider Dr. Virgina Jock       Encounter Date: 10/15/2021  Check In:  Session Check In - 10/15/21 1428       Check-In   Supervising physician immediately available to respond to emergencies [facility] MD immediately available    Physician(s) Dr Gasper Sells    Location [facility]    Staff Present Redge Gainer, BS, Exercise Physiologist;Forest Pruden Hassell Done, RN, Bjorn Loser, MS, ACSM-CEP, Exercise Physiologist    Virtual Visit No    Medication changes reported     No    Fall or balance concerns reported    No    Tobacco Cessation No Change    Warm-up and Cool-down Performed as group-led instruction    Resistance Training Performed Yes    VAD Patient? No    PAD/SET Patient? No      Pain Assessment   Currently in Pain? No/denies    Multiple Pain Sites No             Capillary Blood Glucose: No results found for this or any previous visit (from the past 24 hour(s)).    Social History   Tobacco Use  Smoking Status Former   Packs/day: 0.50   Years: 15.00   Total pack years: 7.50   Types: Cigarettes   Quit date: 11/2020   Years since quitting: 0.8  Smokeless Tobacco Never    Goals Met:  Independence with exercise equipment Exercise tolerated well No report of concerns or symptoms today Strength training completed today  Goals Unmet:  Not Applicable  Comments: Checkout at 1545.   Dr. Carlyle Dolly is Medical Director for [facility] Cardiac Rehab

## 2021-10-15 NOTE — Progress Notes (Signed)
Cardiac Individual Treatment Plan  Patient Details  Name: LIZABETH FELLNER MRN: [id number] Date of Birth: 09-26-1960 Referring Provider:   Flowsheet Row CARDIAC REHAB PHASE II ORIENTATION from 09/03/2021 in [facility]  Referring Provider Dr. Virgina Jock       Initial Encounter Date:  Flowsheet Row CARDIAC REHAB PHASE II ORIENTATION from 09/03/2021 in [facility]  Date 09/03/21       Visit Diagnosis: Status post coronary artery stent placement  NSTEMI (non-ST elevated myocardial infarction) ([place])  Patient's Home Medications on Admission:  Current Outpatient Medications:    amLODipine (NORVASC) 5 MG tablet, TAKE 1 TABLET BY MOUTH ONCE DAILY. (Patient taking differently: Take 5 mg by mouth daily.), Disp: 30 tablet, Rfl: 0   BRILINTA 90 MG TABS tablet, TAKE 1 TABLET BY MOUTH TWICE DAILY (Patient taking differently: Take 90 mg by mouth 2 (two) times daily.), Disp: 60 tablet, Rfl: 11   Calcium Carbonate-Vitamin D (CALTRATE 600+D PO), Take 1 tablet by mouth daily., Disp: , Rfl:    carvedilol (COREG) 12.5 MG tablet, Take 1 tablet (12.5 mg total) by mouth 2 (two) times daily., Disp: 180 tablet, Rfl: 3   cetirizine (ZYRTEC) 10 MG tablet, Take 10 mg by mouth daily., Disp: , Rfl:    cholecalciferol (VITAMIN D3) 25 MCG (1000 UNIT) tablet, Take 1,000 Units by mouth daily., Disp: , Rfl:    esomeprazole (NEXIUM) 20 MG capsule, TAKE 1 CAPSULE BY MOUTH ONCE DAILY AT 12 NOON., Disp: 30 capsule, Rfl: 0   GNP ASPIRIN LOW DOSE 81 MG EC tablet, TAKE 1 TABLET BY MOUTH ONCE DAILY. SWALLOW WHOLE. (Patient taking differently: Take 81 mg by mouth daily.), Disp: 90 tablet, Rfl: 1   insulin aspart (NOVOLOG) 100 UNIT/ML injection, Inject 10 Units into the skin 3 (three) times daily before meals., Disp: , Rfl:    Investigational - Study Medication, EMPACT-MI Study  Empagliflozin /or Placebo, Disp: , Rfl:    isosorbide-hydrALAZINE (BIDIL) 20-37.5 MG tablet, Take 1 tablet  by mouth 3 (three) times daily. (Patient not taking: Reported on 07/30/2021), Disp: 90 tablet, Rfl: 3   ivabradine (CORLANOR) 5 MG TABS tablet, Take 1 tablet (5 mg total) by mouth 2 (two) times daily with a meal. (Patient not taking: Reported on 07/30/2021), Disp: 60 tablet, Rfl: 2   levothyroxine (SYNTHROID) 137 MCG tablet, Take 137 mcg by mouth daily., Disp: , Rfl:    magnesium oxide (MAG-OX) 400 MG tablet, Take 400 mg by mouth 3 (three) times a week., Disp: , Rfl:    metFORMIN (GLUCOPHAGE) 1000 MG tablet, Take 1,000 mg by mouth 2 (two) times daily with a meal., Disp: , Rfl:    nitroGLYCERIN (NITROSTAT) 0.4 MG SL tablet, Place 1 tablet (0.4 mg total) under the tongue every 5 (five) minutes as needed for chest pain., Disp: 30 tablet, Rfl: 3   rosuvastatin (CRESTOR) 40 MG tablet, Take 1 tablet (40 mg total) by mouth daily., Disp: 30 tablet, Rfl: 0   TRESIBA FLEXTOUCH 200 UNIT/ML FlexTouch Pen, Inject 50 Units into the skin daily., Disp: , Rfl:    vitamin B-12 (CYANOCOBALAMIN) 1000 MCG tablet, Take 1,000 mcg by mouth daily., Disp: , Rfl:   Past Medical History: Past Medical History:  Diagnosis Date   Abnormal mammogram    Allergic rhinitis due to pollen 08/01/2019   Arthritis 01/06/2019   osteoarthritis of both knees   BMI 28.0-28.9,adult 08/01/2019   Cystocele, unspecified (CODE)    Diabetes mellitus    GERD (gastroesophageal reflux  disease)    Heart murmur    Hyperlipemia, mixed 12/14/2018   Hypertension    Hypothyroidism    Stress incontinence, female    Thyroid disease    Type 2 diabetes mellitus, without long-term current use of insulin ([place]) 01/06/2019   Vitamin D deficiency 01/06/2019    Tobacco Use: Social History   Tobacco Use  Smoking Status Former   Packs/day: 0.50   Years: 15.00   Total pack years: 7.50   Types: Cigarettes   Quit date: 11/2020   Years since quitting: 0.8  Smokeless Tobacco Never    Labs: Review Flowsheet       Latest Ref Rng & Units 12/06/2020  07/15/2021  Labs for ITP Cardiac and Pulmonary Rehab  Cholestrol 0 - 200 mg/dL 201  -  LDL (calc) 0 - 99 mg/dL 134  -  HDL-C >40 mg/dL 51  -  Trlycerides <150 mg/dL 78  -  Hemoglobin A1c 4.8 - 5.6 % 9.4  11.1   TCO2 22 - 32 mmol/L - 24     Capillary Blood Glucose: Lab Results  Component Value Date   GLUCAP 228 (H) 09/10/2021   GLUCAP 217 (H) 09/03/2021   GLUCAP 181 (H) 07/16/2021   GLUCAP 179 (H) 07/16/2021   GLUCAP 215 (H) 07/15/2021    POCT Glucose     Row Name 09/03/21 1508             POCT Blood Glucose   Pre-Exercise 217 mg/dL                Exercise Target Goals: Exercise Program Goal: Individual exercise prescription set using results from initial 6 min walk test and THRR while considering  patient's activity barriers and safety.   Exercise Prescription Goal: Starting with aerobic activity 30 plus minutes a day, 3 days per week for initial exercise prescription. Provide home exercise prescription and guidelines that participant acknowledges understanding prior to discharge.  Activity Barriers & Risk Stratification:  Activity Barriers & Cardiac Risk Stratification - 09/03/21 1329       Activity Barriers & Cardiac Risk Stratification   Activity Barriers Arthritis;Joint Problems    Cardiac Risk Stratification High             6 Minute Walk:  6 Minute Walk     Row Name 09/03/21 1448         6 Minute Walk   Phase Initial     Distance 750 feet     Walk Time 6 minutes     # of Rest Breaks 0     MPH 1.42     METS 2.35     RPE 11     VO2 Peak 8.24     Symptoms Yes (comment)     Comments burning in center of chest 3/10. Resolved with rest     Resting HR 75 bpm     Resting BP 106/54     Resting Oxygen Saturation  98 %     Exercise Oxygen Saturation  during 6 min walk 97 %     Max Ex. HR 76 bpm     Max Ex. BP 112/60     2 Minute Post BP 120/68              Oxygen Initial Assessment:   Oxygen Re-Evaluation:   Oxygen Discharge  (Final Oxygen Re-Evaluation):   Initial Exercise Prescription:  Initial Exercise Prescription - 09/03/21 1400       Date of Initial  Exercise RX and Referring Provider   Date 09/03/21    Referring Provider Dr. Virgina Jock    Expected Discharge Date 11/28/21      Treadmill   MPH 1.4    Grade 0    Minutes 17      Recumbant Elliptical   Level 1    RPM 50    Minutes 22      Prescription Details   Frequency (times per week) 3    Duration Progress to 30 minutes of continuous aerobic without signs/symptoms of physical distress      Intensity   THRR 40-80% of Max Heartrate 64-128    Ratings of Perceived Exertion 11-13    Perceived Dyspnea 0-4      Resistance Training   Training Prescription Yes    Weight 2    Reps 10-15             Perform Capillary Blood Glucose checks as needed.  Exercise Prescription Changes:   Exercise Prescription Changes     Row Name 09/12/21 1530 09/29/21 1530 10/13/21 1400         Response to Exercise   Blood Pressure (Admit) 120/60 116/60 124/60     Blood Pressure (Exercise) 142/68 182/60 156/70     Blood Pressure (Exit) 120/58 120/50 122/60     Heart Rate (Admit) 84 bpm 84 bpm 77 bpm     Heart Rate (Exercise) 104 bpm 109 bpm 94 bpm     Heart Rate (Exit) 93 bpm 93 bpm 85 bpm     Rating of Perceived Exertion (Exercise) '13 11 11     '$ Duration Continue with 30 min of aerobic exercise without signs/symptoms of physical distress. Continue with 30 min of aerobic exercise without signs/symptoms of physical distress. Continue with 30 min of aerobic exercise without signs/symptoms of physical distress.     Intensity THRR unchanged THRR unchanged THRR unchanged       Progression   Progression Continue to progress workloads to maintain intensity without signs/symptoms of physical distress. Continue to progress workloads to maintain intensity without signs/symptoms of physical distress. Continue to progress workloads to maintain intensity without  signs/symptoms of physical distress.       Resistance Training   Training Prescription Yes Yes Yes     Weight '2 3 2     '$ Reps 10-15 10-15 10-15     Time 10 Minutes 10 Minutes 10 Minutes       Treadmill   MPH 1.7 1.7 --     Grade 0 0 --     Minutes 17 17 --     METs 2.3 2.3 --       Recumbant Elliptical   Level '1 1 1     '$ RPM 30 23 36     Minutes 22 22 39     METs 1.'3 1 1              '$ Exercise Comments:   Exercise Goals and Review:   Exercise Goals     Row Name 09/03/21 1451 09/16/21 0813 10/13/21 1458         Exercise Goals   Increase Physical Activity Yes Yes Yes     Intervention Provide advice, education, support and counseling about physical activity/exercise needs.;Develop an individualized exercise prescription for aerobic and resistive training based on initial evaluation findings, risk stratification, comorbidities and participant's personal goals. Provide advice, education, support and counseling about physical activity/exercise needs.;Develop an individualized exercise prescription for aerobic and resistive training  based on initial evaluation findings, risk stratification, comorbidities and participant's personal goals. Provide advice, education, support and counseling about physical activity/exercise needs.;Develop an individualized exercise prescription for aerobic and resistive training based on initial evaluation findings, risk stratification, comorbidities and participant's personal goals.     Expected Outcomes Short Term: Attend rehab on a regular basis to increase amount of physical activity.;Long Term: Add in home exercise to make exercise part of routine and to increase amount of physical activity.;Long Term: Exercising regularly at least 3-5 days a week. Short Term: Attend rehab on a regular basis to increase amount of physical activity.;Long Term: Add in home exercise to make exercise part of routine and to increase amount of physical activity.;Long Term:  Exercising regularly at least 3-5 days a week. Short Term: Attend rehab on a regular basis to increase amount of physical activity.;Long Term: Add in home exercise to make exercise part of routine and to increase amount of physical activity.;Long Term: Exercising regularly at least 3-5 days a week.     Increase Strength and Stamina Yes Yes Yes     Intervention Provide advice, education, support and counseling about physical activity/exercise needs.;Develop an individualized exercise prescription for aerobic and resistive training based on initial evaluation findings, risk stratification, comorbidities and participant's personal goals. Provide advice, education, support and counseling about physical activity/exercise needs.;Develop an individualized exercise prescription for aerobic and resistive training based on initial evaluation findings, risk stratification, comorbidities and participant's personal goals. Provide advice, education, support and counseling about physical activity/exercise needs.;Develop an individualized exercise prescription for aerobic and resistive training based on initial evaluation findings, risk stratification, comorbidities and participant's personal goals.     Expected Outcomes Short Term: Increase workloads from initial exercise prescription for resistance, speed, and METs.;Short Term: Perform resistance training exercises routinely during rehab and add in resistance training at home;Long Term: Improve cardiorespiratory fitness, muscular endurance and strength as measured by increased METs and functional capacity (6MWT) Short Term: Increase workloads from initial exercise prescription for resistance, speed, and METs.;Short Term: Perform resistance training exercises routinely during rehab and add in resistance training at home;Long Term: Improve cardiorespiratory fitness, muscular endurance and strength as measured by increased METs and functional capacity (6MWT) Short Term: Increase  workloads from initial exercise prescription for resistance, speed, and METs.;Short Term: Perform resistance training exercises routinely during rehab and add in resistance training at home;Long Term: Improve cardiorespiratory fitness, muscular endurance and strength as measured by increased METs and functional capacity (6MWT)     Able to understand and use rate of perceived exertion (RPE) scale Yes Yes Yes     Intervention Provide education and explanation on how to use RPE scale Provide education and explanation on how to use RPE scale Provide education and explanation on how to use RPE scale     Expected Outcomes Short Term: Able to use RPE daily in rehab to express subjective intensity level;Long Term:  Able to use RPE to guide intensity level when exercising independently Short Term: Able to use RPE daily in rehab to express subjective intensity level;Long Term:  Able to use RPE to guide intensity level when exercising independently Short Term: Able to use RPE daily in rehab to express subjective intensity level;Long Term:  Able to use RPE to guide intensity level when exercising independently     Knowledge and understanding of Target Heart Rate Range (THRR) Yes Yes Yes     Intervention Provide education and explanation of THRR including how the numbers were predicted and where they  are located for reference Provide education and explanation of THRR including how the numbers were predicted and where they are located for reference Provide education and explanation of THRR including how the numbers were predicted and where they are located for reference     Expected Outcomes Short Term: Able to state/look up THRR;Short Term: Able to use daily as guideline for intensity in rehab;Long Term: Able to use THRR to govern intensity when exercising independently Short Term: Able to state/look up THRR;Short Term: Able to use daily as guideline for intensity in rehab;Long Term: Able to use THRR to govern intensity  when exercising independently Short Term: Able to state/look up THRR;Short Term: Able to use daily as guideline for intensity in rehab;Long Term: Able to use THRR to govern intensity when exercising independently     Able to check pulse independently Yes Yes Yes     Intervention Provide education and demonstration on how to check pulse in carotid and radial arteries.;Review the importance of being able to check your own pulse for safety during independent exercise Provide education and demonstration on how to check pulse in carotid and radial arteries.;Review the importance of being able to check your own pulse for safety during independent exercise Provide education and demonstration on how to check pulse in carotid and radial arteries.;Review the importance of being able to check your own pulse for safety during independent exercise     Expected Outcomes Short Term: Able to explain why pulse checking is important during independent exercise;Long Term: Able to check pulse independently and accurately Short Term: Able to explain why pulse checking is important during independent exercise;Long Term: Able to check pulse independently and accurately Short Term: Able to explain why pulse checking is important during independent exercise;Long Term: Able to check pulse independently and accurately     Understanding of Exercise Prescription Yes Yes Yes     Intervention Provide education, explanation, and written materials on patient's individual exercise prescription Provide education, explanation, and written materials on patient's individual exercise prescription Provide education, explanation, and written materials on patient's individual exercise prescription     Expected Outcomes Short Term: Able to explain program exercise prescription;Long Term: Able to explain home exercise prescription to exercise independently Short Term: Able to explain program exercise prescription;Long Term: Able to explain home exercise  prescription to exercise independently Short Term: Able to explain program exercise prescription;Long Term: Able to explain home exercise prescription to exercise independently              Exercise Goals Re-Evaluation :  Exercise Goals Re-Evaluation     Row Name 09/16/21 0814 10/13/21 1459           Exercise Goal Re-Evaluation   Exercise Goals Review Increase Physical Activity;Increase Strength and Stamina;Able to understand and use rate of perceived exertion (RPE) scale;Knowledge and understanding of Target Heart Rate Range (THRR);Able to check pulse independently;Understanding of Exercise Prescription Increase Physical Activity;Increase Strength and Stamina;Able to understand and use rate of perceived exertion (RPE) scale;Knowledge and understanding of Target Heart Rate Range (THRR);Able to check pulse independently;Understanding of Exercise Prescription      Comments Pt has completed 3 sessions of cardiac rehab. She is coming into class motivated to improve her health. She gets tired walking on the treadmill but stays on for the 17 mins. She participates in the warmup but does not complete some of the exercises due to her R shoulder hurting. She is currently exercising at 2.30 METs on the treadmill. Will continue to  monitor and progress as able. Pt has completed 8 sessions of cardiac rehab. She states that she feels tired when she is in class and does not push herself. She no longer wants to walk on the treadmill and does the ellp for both sessions. She is on her phone during the class and does her bare minimum. She is currently exercising at 1.0 METs on the ellp. Will continue to monitor and progress as able.      Expected Outcomes Through exercise at rehab and at home, the patient will meet their stated goals. Through exercise at rehab and at home, the patient will meet their stated goals.                Discharge Exercise Prescription (Final Exercise Prescription Changes):   Exercise Prescription Changes - 10/13/21 1400       Response to Exercise   Blood Pressure (Admit) 124/60    Blood Pressure (Exercise) 156/70    Blood Pressure (Exit) 122/60    Heart Rate (Admit) 77 bpm    Heart Rate (Exercise) 94 bpm    Heart Rate (Exit) 85 bpm    Rating of Perceived Exertion (Exercise) 11    Duration Continue with 30 min of aerobic exercise without signs/symptoms of physical distress.    Intensity THRR unchanged      Progression   Progression Continue to progress workloads to maintain intensity without signs/symptoms of physical distress.      Resistance Training   Training Prescription Yes    Weight 2    Reps 10-15    Time 10 Minutes      Recumbant Elliptical   Level 1    RPM 36    Minutes 39    METs 1             Nutrition:  Target Goals: Understanding of nutrition guidelines, daily intake of sodium '1500mg'$ , cholesterol '200mg'$ , calories 30% from fat and 7% or less from saturated fats, daily to have 5 or more servings of fruits and vegetables.  Biometrics:  Pre Biometrics - 09/03/21 1452       Pre Biometrics   Height '5\' 11"'$  (1.803 m)    Weight 82.4 kg    Waist Circumference 40 inches    Hip Circumference 40 inches    Waist to Hip Ratio 1 %    BMI (Calculated) 25.35    Triceps Skinfold 36 mm    % Body Fat 39.5 %    Grip Strength 21.4 kg    Flexibility 0 in    Single Leg Stand 6.08 seconds              Nutrition Therapy Plan and Nutrition Goals:  Nutrition Therapy & Goals - 09/03/21 1331       Intervention Plan   Intervention Nutrition handout(s) given to patient.    Expected Outcomes Short Term Goal: Understand basic principles of dietary content, such as calories, fat, sodium, cholesterol and nutrients.             Nutrition Assessments:  Nutrition Assessments - 09/03/21 1332       MEDFICTS Scores   Pre Score 25            MEDIFICTS Score Key: ?70 Need to make dietary changes  40-70 Heart Healthy Diet ? 40  Therapeutic Level Cholesterol Diet  Flowsheet Row Cardiac Rehab from 01/30/2021 in [facility]  Picture Your Plate Total Score on Admission 51  Picture Your Plate Scores: <56 Unhealthy dietary pattern with much room for improvement. 41-50 Dietary pattern unlikely to meet recommendations for good health and room for improvement. 51-60 More healthful dietary pattern, with some room for improvement.  >60 Healthy dietary pattern, although there may be some specific behaviors that could be improved.    Nutrition Goals Re-Evaluation:   Nutrition Goals Discharge (Final Nutrition Goals Re-Evaluation):   Psychosocial: Target Goals: Acknowledge presence or absence of significant depression and/or stress, maximize coping skills, provide positive support system. Participant is able to verbalize types and ability to use techniques and skills needed for reducing stress and depression.  Initial Review & Psychosocial Screening:  Initial Psych Review & Screening - 09/03/21 1331       Initial Review   Current issues with None Identified      Family Dynamics   Good Support System? Yes    Comments Her daughter and her sister are her main support system.      Barriers   Psychosocial barriers to participate in program There are no identifiable barriers or psychosocial needs.;The patient should benefit from training in stress management and relaxation.      Screening Interventions   Interventions Encouraged to exercise;Provide feedback about the scores to participant;To provide support and resources with identified psychosocial needs    Expected Outcomes Long Term goal: The participant improves quality of Life and PHQ9 Scores as seen by post scores and/or verbalization of changes;Short Term goal: Identification and review with participant of any Quality of Life or Depression concerns found by scoring the questionnaire.             Quality of Life Scores:  Quality of  Life - 09/03/21 1452       Quality of Life   Select Quality of Life      Quality of Life Scores   Health/Function Pre 18.11 %    Socioeconomic Pre 23.79 %    Psych/Spiritual Pre 20.57 %    Family Pre 20.13 %    GLOBAL Pre 20.14 %            Scores of 19 and below usually indicate a poorer quality of life in these areas.  A difference of  2-3 points is a clinically meaningful difference.  A difference of 2-3 points in the total score of the Quality of Life Index has been associated with significant improvement in overall quality of life, self-image, physical symptoms, and general health in studies assessing change in quality of life.  PHQ-9: Review Flowsheet       09/03/2021 03/17/2021 03/03/2021 01/30/2021  Depression screen PHQ 2/9  Decreased Interest 1 0 2 1  Down, Depressed, Hopeless 0 0 0 1  PHQ - 2 Score 1 0 2 2  Altered sleeping '1 1 3 2  '$ Tired, decreased energy '1 1 2 3  '$ Change in appetite 0 '1 3 1  '$ Feeling bad or failure about yourself  0 0 0 0  Trouble concentrating 1 0 1 -  Moving slowly or fidgety/restless 0 0 0 2  Suicidal thoughts 0 0 0 0  PHQ-9 Score '4 3 11 10  '$ Difficult doing work/chores Somewhat difficult Somewhat difficult Somewhat difficult Somewhat difficult   Interpretation of Total Score  Total Score Depression Severity:  1-4 = Minimal depression, 5-9 = Mild depression, 10-14 = Moderate depression, 15-19 = Moderately severe depression, 20-27 = Severe depression   Psychosocial Evaluation and Intervention:  Psychosocial Evaluation - 09/03/21 1453  Psychosocial Evaluation & Interventions   Interventions Encouraged to exercise with the program and follow exercise prescription    Comments Pt has no barriers to participating in CR. She has no identifiable psychosocial issues. 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 She participated in [place] [facility] after this, and it did help some, but she  reports she never got back to her baseline. She then went down hill again when she received another stent earlier this year. She reports that she has a good support system with her daughter and her sister. She has lost weight in the last few months and reports making several dietary changes in order to have better control of her heart disease and her diabetes. 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 She states that the job is just too physical for her. She was not interested in Vocational Rehab though. Her goals while in the program are to improve her strength and stamina. She is eager to start the program.    Expected Outcomes Pt will continue to have no identifiable psychosocial issues.    Continue Psychosocial Services  No Follow up required             Psychosocial Re-Evaluation:  Psychosocial Re-Evaluation     [place] Name 09/08/21 1010 10/06/21 1105           Psychosocial Re-Evaluation   Current issues with None Identified None Identified      Comments Patient plans to start the program today. We will continue to monitor her psychosocial progress. Patient has completed 6 sessions. She continues to have no psychosocial barriers identified. Her attendance has been inconsistent. She does demonstrate an interest in improving her health. We will continue to monitor.      Expected Outcomes Patient will continue to have no psychosocial barriers identified. Patient will continue to have no psychosocial barriers identified.      Interventions Stress management education;Relaxation education;Encouraged to attend Cardiac Rehabilitation for the exercise Stress management education;Relaxation education;Encouraged to attend Cardiac Rehabilitation for the exercise      Continue Psychosocial Services  No Follow up required No Follow up required                Psychosocial Discharge (Final Psychosocial Re-Evaluation):  Psychosocial Re-Evaluation - 10/06/21 1105       Psychosocial Re-Evaluation   Current issues with None Identified    Comments Patient has completed 6 sessions. She continues to have no psychosocial barriers identified. Her attendance has been inconsistent. She does demonstrate an interest in improving her health. We will continue to monitor.    Expected Outcomes Patient will continue to have no psychosocial barriers identified.    Interventions Stress management education;Relaxation education;Encouraged to attend Cardiac Rehabilitation for the exercise    Continue Psychosocial Services  No Follow up required             Vocational Rehabilitation: Provide vocational rehab assistance to qualifying candidates.   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             Education: Education Goals: Education classes will be provided on a weekly basis, covering required topics. Participant will state understanding/return demonstration of topics presented.  Learning Barriers/Preferences:  Learning Barriers/Preferences - 09/03/21 1334       Learning Barriers/Preferences   Learning Barriers None    Learning Preferences Written Material;Skilled Demonstration;Individual Instruction             Education Topics: Hypertension, Hypertension Reduction -Define heart disease and high blood pressure. Discus how high blood pressure affects the body and ways to reduce high blood pressure.   Exercise and Your Heart -Discuss why it is important to exercise, the FITT principles of exercise, normal  and abnormal responses to exercise, and how to exercise safely.   Angina -Discuss definition of angina, causes of angina, treatment of angina, and how to decrease risk of having angina.   Cardiac Medications -Review what the following cardiac medications are used for, how they affect the body, and side effects that may occur when taking the medications.  Medications include Aspirin, Beta blockers, calcium channel blockers, ACE Inhibitors, angiotensin receptor blockers, diuretics, digoxin, and antihyperlipidemics.   Congestive Heart Failure -Discuss the definition of CHF, how to live with CHF, the signs and symptoms of CHF, and how keep track of weight and sodium intake.   Heart Disease and Intimacy -Discus the effect sexual activity has on the heart, how changes occur during intimacy as we age, and safety during sexual activity.   Smoking Cessation / COPD -Discuss different methods to quit smoking, the health benefits of quitting smoking, and the definition of COPD. Flowsheet Row CARDIAC REHAB PHASE II EXERCISE from 10/08/2021 in [place]  Date 09/10/21  Educator [place]  Instruction Review Code 1- Verbalizes Understanding       Nutrition I: Fats -Discuss the types of cholesterol, what cholesterol does to the heart, and how cholesterol levels can be controlled. Flowsheet Row CARDIAC REHAB PHASE II EXERCISE from 10/08/2021 in [place]  Date 09/17/21  Educator [place]  Instruction Review Code 1- Verbalizes Understanding       Nutrition II: Labels -Discuss the different components of food labels and how to read food label   Heart Parts/Heart Disease and PAD -Discuss the anatomy of the heart, the pathway of blood circulation through the heart, and these are affected by heart disease. Flowsheet Row CARDIAC REHAB PHASE II EXERCISE from 10/08/2021 in [place]  Date 10/08/21  Educator [place]  Instruction Review Code 1-  Verbalizes Understanding       Stress I: Signs and Symptoms -Discuss the causes of stress, how stress may lead to anxiety and depression, and ways to limit stress.   Stress II: Relaxation -Discuss different types of relaxation techniques to limit stress.   Warning Signs of Stroke / TIA -Discuss definition of a stroke, what the signs and symptoms are of a stroke, and how to identify when someone is having stroke.   Knowledge Questionnaire Score:  Knowledge Questionnaire Score - 09/03/21 1335       Knowledge Questionnaire Score   Pre Score 23/28             Core Components/Risk Factors/Patient Goals at Admission:  Personal Goals and Risk Factors at Admission - 09/03/21 1338       Core Components/Risk Factors/Patient Goals on Admission   Diabetes  Yes    Intervention Provide education about signs/symptoms and action to take for hypo/hyperglycemia.;Provide education about proper nutrition, including hydration, and aerobic/resistive exercise prescription along with prescribed medications to achieve blood glucose in normal ranges: Fasting glucose 65-99 mg/dL    Expected Outcomes Short Term: Participant verbalizes understanding of the signs/symptoms and immediate care of hyper/hypoglycemia, proper foot care and importance of medication, aerobic/resistive exercise and nutrition plan for blood glucose control.;Long Term: Attainment of HbA1C < 7%.    Hypertension Yes    Intervention Provide education on lifestyle modifcations including regular physical activity/exercise, weight management, moderate sodium restriction and increased consumption of fresh fruit, vegetables, and low fat dairy, alcohol moderation, and smoking cessation.;Monitor prescription use compliance.    Expected Outcomes Short Term: Continued assessment and intervention until BP is < 140/39m HG in hypertensive participants. < 130/815mHG in hypertensive participants with diabetes, heart failure or chronic kidney  disease.;Long Term: Maintenance of blood pressure at goal levels.    Lipids Yes    Intervention Provide education and support for participant on nutrition & aerobic/resistive exercise along with prescribed medications to achieve LDL '70mg'$ , HDL >'40mg'$ .    Expected Outcomes Short Term: Participant states understanding of desired cholesterol values and is compliant with medications prescribed. Participant is following exercise prescription and nutrition guidelines.;Long Term: Cholesterol controlled with medications as prescribed, with individualized exercise RX and with personalized nutrition plan. Value goals: LDL < '70mg'$ , HDL > 40 mg.    Personal Goal Other Yes    Personal Goal Improve strength and stamina    Intervention Attend CR twice per week and begin a home exercise program atleast two days per week.    Expected Outcomes Pt will report improvements in her strength and stamina, and she will be able to increase her walk test distance.             Core Components/Risk Factors/Patient Goals Review:   Goals and Risk Factor Review     Row Name 09/08/21 1013 10/06/21 1109           Core Components/Risk Factors/Patient Goals Review   Personal Goals Review Diabetes;Hypertension;Lipids;Other Diabetes;Hypertension;Lipids;Other      Review Patient referred to CR with Stent placement. She has multiple risk factors for CAD and is participating in the program for risk modification. She plans to start the program today. Her last A1C was 07/15/21 at 11.1% up from 9.4% 9 months ago. Her DM is managed with Insulin, Tresiba, and Metformin. Her personal goals for the program are to improve her strenght and stamina. We will continue to monitor her progress as she works towards meeting these goals. Patient has completed 6 sessions. Her current weight is 184.1 lbs down 2 lbs from her initial weight. Patient saw Dr. PaVirgina Jock/7 for routine follow up visit. Her blood pressure was elevated 162/77 at his office.  Her blood pressures have been controlled in CR with highest systolic reading of 12063He added Labetalol 200 mg bid and changed metoprolol to coreg 12.5 mg bid due to fatigue and normal EF. Her last A1C was 3/23 at 11% and he encouraged patient to follow closely with pcp for better management. She in on Novolog, Tresiba, and Metformin for DM controll. Her 2 reported reading for [place] been >200 mg/dl. Her attendance has not been consistent. She reported angina to Dr. PaVirgina Jockith exerction but she has not complained of chest pain in cardiac rehab. Her personal goals for the program continue to be to improve her strength and  stamina. We will continue to monitor her progress as she works towards meeting these goals.      Expected Outcomes Patient will complete the program meeting both personal and program goals. Patient will complete the program meeting both personal and program goals.               Core Components/Risk Factors/Patient Goals at Discharge (Final Review):   Goals and Risk Factor Review - 10/06/21 1109       Core Components/Risk Factors/Patient Goals Review   Personal Goals Review Diabetes;Hypertension;Lipids;Other    Review Patient has completed 6 sessions. Her current weight is 184.1 lbs down 2 lbs from her initial weight. Patient saw Dr. Virgina Jock 6/7 for routine follow up visit. Her blood pressure was elevated 162/77 at his office. Her blood pressures have been controlled in CR with highest systolic reading of 921. He added Labetalol 200 mg bid and changed metoprolol to coreg 12.5 mg bid due to fatigue and normal EF. Her last A1C was 3/23 at 11% and he encouraged patient to follow closely with pcp for better management. She in on Novolog, Tresiba, and Metformin for DM controll. Her 2 reported reading for [place] have been >200 mg/dl. Her attendance has not been consistent. She reported angina to Dr. Virgina Jock with exerction but she has not complained of chest pain in cardiac rehab. Her  personal goals for the program continue to be to improve her strength and stamina. We will continue to monitor her progress as she works towards meeting these goals.    Expected Outcomes Patient will complete the program meeting both personal and program goals.             ITP Comments:   Comments: ITP REVIEW Pt is making expected progress toward Cardiac Rehab goals after completing 8 sessions. Recommend continued exercise, life style modification, education, and increased stamina and strength.

## 2021-10-17 ENCOUNTER — Encounter (HOSPITAL_COMMUNITY): Payer: BC Managed Care – PPO

## 2021-10-20 ENCOUNTER — Encounter (HOSPITAL_COMMUNITY)
Admission: RE | Admit: 2021-10-20 | Discharge: 2021-10-20 | Disposition: A | Discharge status: Home / Self Care | Payer: BC Managed Care – PPO | Source: Ambulatory Visit | Attending: Cardiology | Admitting: Cardiology

## 2021-10-20 DIAGNOSIS — I214 Non-ST elevation (NSTEMI) myocardial infarction: Secondary | ICD-10-CM

## 2021-10-20 DIAGNOSIS — Z955 Presence of coronary angioplasty implant and graft: Secondary | ICD-10-CM

## 2021-10-22 ENCOUNTER — Encounter (HOSPITAL_COMMUNITY): Payer: BC Managed Care – PPO

## 2021-10-24 ENCOUNTER — Encounter (HOSPITAL_COMMUNITY): Payer: BC Managed Care – PPO

## 2021-10-27 ENCOUNTER — Encounter (HOSPITAL_COMMUNITY): Payer: BC Managed Care – PPO

## 2021-10-29 ENCOUNTER — Encounter (HOSPITAL_COMMUNITY): Payer: BC Managed Care – PPO

## 2021-10-31 ENCOUNTER — Encounter (HOSPITAL_COMMUNITY)
Admission: RE | Admit: 2021-10-31 | Discharge: 2021-10-31 | Disposition: A | Discharge status: Home / Self Care | Payer: BC Managed Care – PPO | Source: Ambulatory Visit | Attending: Cardiology | Admitting: Cardiology

## 2021-10-31 DIAGNOSIS — I214 Non-ST elevation (NSTEMI) myocardial infarction: Secondary | ICD-10-CM | POA: Diagnosis present

## 2021-10-31 DIAGNOSIS — Z955 Presence of coronary angioplasty implant and graft: Secondary | ICD-10-CM | POA: Diagnosis not present

## 2021-10-31 NOTE — Progress Notes (Signed)
Daily Session Note  Patient Details  Name: Monica Bell MRN: [id number] Date of Birth: [birth year] Referring Provider:   Flowsheet Row CARDIAC REHAB PHASE II ORIENTATION from 09/03/2021 in [place]  Referring Provider Dr. Virgina Jock       Encounter Date: 10/31/2021  Check In:  Session Check In - 10/31/21 1443       Check-In   Supervising physician immediately available to respond to emergencies [facility] MD immediately available    Physician(s) Dr. Marlou Porch    Location [facility]    Staff Present Redge Gainer, BS, Exercise Physiologist;Rebel Willcutt Wynetta Emery, RN, BSN    Virtual Visit No    Medication changes reported     No    Fall or balance concerns reported    No    Tobacco Cessation No Change    Warm-up and Cool-down Performed as group-led instruction    Resistance Training Performed Yes    VAD Patient? No    PAD/SET Patient? No      Pain Assessment   Currently in Pain? No/denies    Multiple Pain Sites No             Capillary Blood Glucose: No results found for this or any previous visit (from the past 24 hour(s)).    Social History   Tobacco Use  Smoking Status Former   Packs/day: 0.50   Years: 15.00   Total pack years: 7.50   Types: Cigarettes   Quit date: 11/2020   Years since quitting: 0.9  Smokeless Tobacco Never    Goals Met:  Independence with exercise equipment Exercise tolerated well No report of concerns or symptoms today Strength training completed today  Goals Unmet:  Not Applicable  Comments: Check out 1545.   Dr. Carlyle Dolly is Medical Director for [facility]

## 2021-11-03 ENCOUNTER — Encounter (HOSPITAL_COMMUNITY)
Admission: RE | Admit: 2021-11-03 | Discharge: 2021-11-03 | Disposition: A | Discharge status: Home / Self Care | Payer: BC Managed Care – PPO | Source: Ambulatory Visit | Attending: Cardiology | Admitting: Cardiology

## 2021-11-03 DIAGNOSIS — I214 Non-ST elevation (NSTEMI) myocardial infarction: Secondary | ICD-10-CM

## 2021-11-03 DIAGNOSIS — Z955 Presence of coronary angioplasty implant and graft: Secondary | ICD-10-CM | POA: Diagnosis not present

## 2021-11-03 NOTE — Progress Notes (Signed)
Daily Session Note  Patient Details  Name: Monica Bell MRN: [id number] Date of Birth: [birth year] Referring Provider:   Flowsheet Row CARDIAC REHAB PHASE II ORIENTATION from 09/03/2021 in [place]  Referring Provider Dr. Virgina Jock       Encounter Date: 11/03/2021  Check In:  Session Check In - 11/03/21 1440       Check-In   Supervising physician immediately available to respond to emergencies [facility] MD immediately available    Physician(s) Dr. Harrington Challenger    Location [facility]    Staff Present Geanie Cooley, RN;Heather Otho Ket, BS, Exercise Physiologist;Dalton Kris Mouton, MS, ACSM-CEP, Exercise Physiologist    Virtual Visit No    Medication changes reported     No    Fall or balance concerns reported    No    Tobacco Cessation No Change    Warm-up and Cool-down Performed as group-led instruction    Resistance Training Performed Yes    VAD Patient? No    PAD/SET Patient? No      Pain Assessment   Currently in Pain? No/denies    Multiple Pain Sites No             Capillary Blood Glucose: No results found for this or any previous visit (from the past 24 hour(s)).    Social History   Tobacco Use  Smoking Status Former   Packs/day: 0.50   Years: 15.00   Total pack years: 7.50   Types: Cigarettes   Quit date: 11/2020   Years since quitting: 0.9  Smokeless Tobacco Never    Goals Met:  Independence with exercise equipment Exercise tolerated well No report of concerns or symptoms today Strength training completed today  Goals Unmet:  Not Applicable  Comments: check out @ 3:45pm   Dr. Carlyle Dolly is Medical Director for [place]

## 2021-11-05 ENCOUNTER — Encounter (HOSPITAL_COMMUNITY)
Admission: RE | Admit: 2021-11-05 | Discharge: 2021-11-05 | Disposition: A | Discharge status: Home / Self Care | Payer: BC Managed Care – PPO | Source: Ambulatory Visit | Attending: Cardiology | Admitting: Cardiology

## 2021-11-05 DIAGNOSIS — I214 Non-ST elevation (NSTEMI) myocardial infarction: Secondary | ICD-10-CM

## 2021-11-05 DIAGNOSIS — Z955 Presence of coronary angioplasty implant and graft: Secondary | ICD-10-CM

## 2021-11-05 NOTE — Progress Notes (Signed)
Daily Session Note  Patient Details  Name: Monica Bell MRN: [id number] Date of Birth: 02/24/1961 Referring Provider:   Flowsheet Row CARDIAC REHAB PHASE II ORIENTATION from 09/03/2021 in [place] [facility]  Referring Provider Dr. Patwardhan       Encounter Date: 11/05/2021  Check In:  Session Check In - 11/05/21 1443       Check-In   Supervising physician immediately available to respond to emergencies [facility] immediately available    Physician(s) Dr O'Neal    Location [facility]    Staff Present Heather Jachimiak, BS, Exercise Physiologist;Dalton Fletcher, MS, ACSM-CEP, Exercise Physiologist;Daphyne Martin, RN, BSN    Virtual Visit No    Medication changes reported     No    Comments Dr. Patwardhan discontinued Metoprolol and added coreg 12.5 mg bid and Labetalol 200 mg bid.    Fall or balance concerns reported    No    Tobacco Cessation No Change    Warm-up and Cool-down Performed as group-led instruction    Resistance Training Performed Yes    VAD Patient? No    PAD/SET Patient? No      Pain Assessment   Currently in Pain? No/denies    Multiple Pain Sites No             Capillary Blood Glucose: No results found for this or any previous visit (from the past 24 hour(s)).    Social History   Tobacco Use  Smoking Status Former   Packs/day: 0.50   Years: 15.00   Total pack years: 7.50   Types: Cigarettes   Quit date: 11/2020   Years since quitting: 0.9  Smokeless Tobacco Never    Goals Met:  Independence with exercise equipment Exercise tolerated well No report of concerns or symptoms today Strength training completed today  Goals Unmet:  Not Applicable  Comments: Checkout at 1545.   Dr. Jonathan Branch is Medical Director for [facility] 

## 2021-11-07 ENCOUNTER — Encounter (HOSPITAL_COMMUNITY): Payer: BC Managed Care – PPO

## 2021-11-10 ENCOUNTER — Encounter (HOSPITAL_COMMUNITY)
Admission: RE | Admit: 2021-11-10 | Discharge: 2021-11-10 | Disposition: A | Discharge status: Home / Self Care | Payer: BC Managed Care – PPO | Source: Ambulatory Visit | Attending: Cardiology | Admitting: Cardiology

## 2021-11-10 VITALS — Wt 183.9 lb

## 2021-11-10 DIAGNOSIS — Z955 Presence of coronary angioplasty implant and graft: Secondary | ICD-10-CM

## 2021-11-10 NOTE — Progress Notes (Signed)
Daily Session Note  Patient Details  Name: Monica Bell MRN: [id number] Date of Birth: 1961/02/17 Referring Provider:   Flowsheet Row CARDIAC REHAB PHASE II ORIENTATION from 09/03/2021 in [place]  Referring Provider Dr. Virgina Jock       Encounter Date: 11/10/2021  Check In:  Session Check In - 11/10/21 1445       Check-In   Supervising physician immediately available to respond to emergencies [facility] immediately available    Physician(s) Dr. Phineas Inches    Location [facility]    Staff Present Redge Gainer, BS, Exercise Physiologist;Chandlar Staebell Wynetta Emery, RN, BSN    Virtual Visit No    Medication changes reported     No    Fall or balance concerns reported    No    Tobacco Cessation No Change    Warm-up and Cool-down Performed as group-led instruction    Resistance Training Performed Yes    VAD Patient? No    PAD/SET Patient? No      Pain Assessment   Currently in Pain? No/denies    Multiple Pain Sites No             Capillary Blood Glucose: No results found for this or any previous visit (from the past 24 hour(s)).    Social History   Tobacco Use  Smoking Status Former   Packs/day: 0.50   Years: 15.00   Total pack years: 7.50   Types: Cigarettes   Quit date: 11/2020   Years since quitting: 0.9  Smokeless Tobacco Never    Goals Met:  Independence with exercise equipment Exercise tolerated well No report of concerns or symptoms today Strength training completed today  Goals Unmet:  Not Applicable  Comments: Check out 1545.   Dr. Carlyle Dolly is Medical Director for [facility]

## 2021-11-12 ENCOUNTER — Encounter (HOSPITAL_COMMUNITY)
Admission: RE | Admit: 2021-11-12 | Discharge: 2021-11-12 | Disposition: A | Discharge status: Home / Self Care | Payer: BC Managed Care – PPO | Source: Ambulatory Visit | Attending: Cardiology | Admitting: Cardiology

## 2021-11-12 DIAGNOSIS — I214 Non-ST elevation (NSTEMI) myocardial infarction: Secondary | ICD-10-CM

## 2021-11-12 DIAGNOSIS — Z955 Presence of coronary angioplasty implant and graft: Secondary | ICD-10-CM | POA: Diagnosis not present

## 2021-11-12 NOTE — Progress Notes (Signed)
Cardiac Individual Treatment Plan  Patient Details  Name: Monica Bell MRN: [id number] Date of Birth: 1960/10/20 Referring Provider:   Flowsheet Row CARDIAC REHAB PHASE II ORIENTATION from 09/03/2021 in [place]  Referring Provider Dr. Virgina Jock       Initial Encounter Date:  Flowsheet Row CARDIAC REHAB PHASE II ORIENTATION from 09/03/2021 in [place]  Date 09/03/21       Visit Diagnosis: Status post coronary artery stent placement  Patient's Home Medications on Admission:  Current Outpatient Medications:    amLODipine (NORVASC) 5 MG tablet, TAKE 1 TABLET BY MOUTH ONCE DAILY. (Patient taking differently: Take 5 mg by mouth daily.), Disp: 30 tablet, Rfl: 0   BRILINTA 90 MG TABS tablet, TAKE 1 TABLET BY MOUTH TWICE DAILY (Patient taking differently: Take 90 mg by mouth 2 (two) times daily.), Disp: 60 tablet, Rfl: 11   Calcium Carbonate-Vitamin D (CALTRATE 600+D PO), Take 1 tablet by mouth daily., Disp: , Rfl:    carvedilol (COREG) 12.5 MG tablet, Take 1 tablet (12.5 mg total) by mouth 2 (two) times daily., Disp: 180 tablet, Rfl: 3   cetirizine (ZYRTEC) 10 MG tablet, Take 10 mg by mouth daily., Disp: , Rfl:    cholecalciferol (VITAMIN D3) 25 MCG (1000 UNIT) tablet, Take 1,000 Units by mouth daily., Disp: , Rfl:    esomeprazole (NEXIUM) 20 MG capsule, TAKE 1 CAPSULE BY MOUTH ONCE DAILY AT 12 NOON., Disp: 30 capsule, Rfl: 0   GNP ASPIRIN LOW DOSE 81 MG EC tablet, TAKE 1 TABLET BY MOUTH ONCE DAILY. SWALLOW WHOLE. (Patient taking differently: Take 81 mg by mouth daily.), Disp: 90 tablet, Rfl: 1   insulin aspart (NOVOLOG) 100 UNIT/ML injection, Inject 10 Units into the skin 3 (three) times daily before meals., Disp: , Rfl:    Investigational - Study Medication, EMPACT-MI Study  Empagliflozin /or Placebo, Disp: , Rfl:    isosorbide-hydrALAZINE (BIDIL) 20-37.5 MG tablet, Take 1 tablet by mouth 3 (three) times daily. (Patient not taking:  Reported on 07/30/2021), Disp: 90 tablet, Rfl: 3   ivabradine (CORLANOR) 5 MG TABS tablet, Take 1 tablet (5 mg total) by mouth 2 (two) times daily with a meal. (Patient not taking: Reported on 07/30/2021), Disp: 60 tablet, Rfl: 2   levothyroxine (SYNTHROID) 137 MCG tablet, Take 137 mcg by mouth daily., Disp: , Rfl:    magnesium oxide (MAG-OX) 400 MG tablet, Take 400 mg by mouth 3 (three) times a week., Disp: , Rfl:    metFORMIN (GLUCOPHAGE) 1000 MG tablet, Take 1,000 mg by mouth 2 (two) times daily with a meal., Disp: , Rfl:    nitroGLYCERIN (NITROSTAT) 0.4 MG SL tablet, Place 1 tablet (0.4 mg total) under the tongue every 5 (five) minutes as needed for chest pain., Disp: 30 tablet, Rfl: 3   rosuvastatin (CRESTOR) 40 MG tablet, Take 1 tablet (40 mg total) by mouth daily., Disp: 30 tablet, Rfl: 0   TRESIBA FLEXTOUCH 200 UNIT/ML FlexTouch Pen, Inject 50 Units into the skin daily., Disp: , Rfl:    vitamin B-12 (CYANOCOBALAMIN) 1000 MCG tablet, Take 1,000 mcg by mouth daily., Disp: , Rfl:   Past Medical History: Past Medical History:  Diagnosis Date   Abnormal mammogram    Allergic rhinitis due to pollen 08/01/2019   Arthritis 01/06/2019   osteoarthritis of both knees   BMI 28.0-28.9,adult 08/01/2019   Cystocele, unspecified (CODE)    Diabetes mellitus    GERD (gastroesophageal reflux disease)    Heart murmur  Hyperlipemia, mixed 12/14/2018   Hypertension    Hypothyroidism    Stress incontinence, female    Thyroid disease    Type 2 diabetes mellitus, without long-term current use of insulin ([place]) 01/06/2019   Vitamin D deficiency 01/06/2019    Tobacco Use: Social History   Tobacco Use  Smoking Status Former   Packs/day: 0.50   Years: 15.00   Total pack years: 7.50   Types: Cigarettes   Quit date: 11/2020   Years since quitting: 0.9  Smokeless Tobacco Never    Labs: Review Flowsheet       Latest Ref Rng & Units 12/06/2020 07/15/2021  Labs for ITP Cardiac and Pulmonary Rehab   Cholestrol 0 - 200 mg/dL 201  -  LDL (calc) 0 - 99 mg/dL 134  -  HDL-C >40 mg/dL 51  -  Trlycerides <150 mg/dL 78  -  Hemoglobin A1c 4.8 - 5.6 % 9.4  11.1   TCO2 22 - 32 mmol/L - 24     Capillary Blood Glucose: Lab Results  Component Value Date   GLUCAP 228 (H) 09/10/2021   GLUCAP 217 (H) 09/03/2021   GLUCAP 181 (H) 07/16/2021   GLUCAP 179 (H) 07/16/2021   GLUCAP 215 (H) 07/15/2021    POCT Glucose     Row Name 09/03/21 1508             POCT Blood Glucose   Pre-Exercise 217 mg/dL                Exercise Target Goals: Exercise Program Goal: Individual exercise prescription set using results from initial 6 min walk test and THRR while considering  patient's activity barriers and safety.   Exercise Prescription Goal: Starting with aerobic activity 30 plus minutes a day, 3 days per week for initial exercise prescription. Provide home exercise prescription and guidelines that participant acknowledges understanding prior to discharge.  Activity Barriers & Risk Stratification:  Activity Barriers & Cardiac Risk Stratification - 09/03/21 1329       Activity Barriers & Cardiac Risk Stratification   Activity Barriers Arthritis;Joint Problems    Cardiac Risk Stratification High             6 Minute Walk:  6 Minute Walk     Row Name 09/03/21 1448         6 Minute Walk   Phase Initial     Distance 750 feet     Walk Time 6 minutes     # of Rest Breaks 0     MPH 1.42     METS 2.35     RPE 11     VO2 Peak 8.24     Symptoms Yes (comment)     Comments burning in center of chest 3/10. Resolved with rest     Resting HR 75 bpm     Resting BP 106/54     Resting Oxygen Saturation  98 %     Exercise Oxygen Saturation  during 6 min walk 97 %     Max Ex. HR 76 bpm     Max Ex. BP 112/60     2 Minute Post BP 120/68              Oxygen Initial Assessment:   Oxygen Re-Evaluation:   Oxygen Discharge (Final Oxygen Re-Evaluation):   Initial Exercise  Prescription:  Initial Exercise Prescription - 09/03/21 1400       Date of Initial Exercise RX and Referring Provider   Date 09/03/21  Referring Provider Dr. Virgina Jock    Expected Discharge Date 11/28/21      Treadmill   MPH 1.4    Grade 0    Minutes 17      Recumbant Elliptical   Level 1    RPM 50    Minutes 22      Prescription Details   Frequency (times per week) 3    Duration Progress to 30 minutes of continuous aerobic without signs/symptoms of physical distress      Intensity   THRR 40-80% of Max Heartrate 64-128    Ratings of Perceived Exertion 11-13    Perceived Dyspnea 0-4      Resistance Training   Training Prescription Yes    Weight 2    Reps 10-15             Perform Capillary Blood Glucose checks as needed.  Exercise Prescription Changes:   Exercise Prescription Changes     Row Name 09/12/21 1530 09/29/21 1530 10/13/21 1400 10/20/21 1530 11/10/21 1500     Response to Exercise   Blood Pressure (Admit) 120/60 116/60 124/60 140/54 126/60   Blood Pressure (Exercise) 142/68 182/60 156/70 140/68 160/60   Blood Pressure (Exit) 120/58 120/50 122/60 118/60 110/50   Heart Rate (Admit) 84 bpm 84 bpm 77 bpm 80 bpm 74 bpm   Heart Rate (Exercise) 104 bpm 109 bpm 94 bpm 97 bpm 96 bpm   Heart Rate (Exit) 93 bpm 93 bpm 85 bpm 72 bpm 80 bpm   Rating of Perceived Exertion (Exercise) '13 11 11 11 11   '$ Duration Continue with 30 min of aerobic exercise without signs/symptoms of physical distress. Continue with 30 min of aerobic exercise without signs/symptoms of physical distress. Continue with 30 min of aerobic exercise without signs/symptoms of physical distress. Continue with 30 min of aerobic exercise without signs/symptoms of physical distress. Continue with 30 min of aerobic exercise without signs/symptoms of physical distress.   Intensity THRR unchanged THRR unchanged THRR unchanged THRR unchanged THRR unchanged     Progression   Progression Continue to  progress workloads to maintain intensity without signs/symptoms of physical distress. Continue to progress workloads to maintain intensity without signs/symptoms of physical distress. Continue to progress workloads to maintain intensity without signs/symptoms of physical distress. Continue to progress workloads to maintain intensity without signs/symptoms of physical distress. Continue to progress workloads to maintain intensity without signs/symptoms of physical distress.     Resistance Training   Training Prescription Yes Yes Yes Yes Yes   Weight '2 3 2 2 2   '$ Reps 10-15 10-15 10-15 10-15 10-15   Time 10 Minutes 10 Minutes 10 Minutes 10 Minutes 10 Minutes     Treadmill   MPH 1.7 1.7 -- -- --   Grade 0 0 -- -- --   Minutes 17 17 -- -- --   METs 2.3 2.3 -- -- --     Recumbant Elliptical   Level '1 1 1 1 1   '$ RPM 30 23 36 19 35   Minutes 22 22 39 39 39   METs 1.'3 1 1 1 '$ 1.3            Exercise Comments:   Exercise Goals and Review:   Exercise Goals     Row Name 09/03/21 1451 09/16/21 0813 10/13/21 1458         Exercise Goals   Increase Physical Activity Yes Yes Yes     Intervention Provide advice, education, support and counseling about physical  activity/exercise needs.;Develop an individualized exercise prescription for aerobic and resistive training based on initial evaluation findings, risk stratification, comorbidities and participant's personal goals. Provide advice, education, support and counseling about physical activity/exercise needs.;Develop an individualized exercise prescription for aerobic and resistive training based on initial evaluation findings, risk stratification, comorbidities and participant's personal goals. Provide advice, education, support and counseling about physical activity/exercise needs.;Develop an individualized exercise prescription for aerobic and resistive training based on initial evaluation findings, risk stratification, comorbidities and  participant's personal goals.     Expected Outcomes Short Term: Attend rehab on a regular basis to increase amount of physical activity.;Long Term: Add in home exercise to make exercise part of routine and to increase amount of physical activity.;Long Term: Exercising regularly at least 3-5 days a week. Short Term: Attend rehab on a regular basis to increase amount of physical activity.;Long Term: Add in home exercise to make exercise part of routine and to increase amount of physical activity.;Long Term: Exercising regularly at least 3-5 days a week. Short Term: Attend rehab on a regular basis to increase amount of physical activity.;Long Term: Add in home exercise to make exercise part of routine and to increase amount of physical activity.;Long Term: Exercising regularly at least 3-5 days a week.     Increase Strength and Stamina Yes Yes Yes     Intervention Provide advice, education, support and counseling about physical activity/exercise needs.;Develop an individualized exercise prescription for aerobic and resistive training based on initial evaluation findings, risk stratification, comorbidities and participant's personal goals. Provide advice, education, support and counseling about physical activity/exercise needs.;Develop an individualized exercise prescription for aerobic and resistive training based on initial evaluation findings, risk stratification, comorbidities and participant's personal goals. Provide advice, education, support and counseling about physical activity/exercise needs.;Develop an individualized exercise prescription for aerobic and resistive training based on initial evaluation findings, risk stratification, comorbidities and participant's personal goals.     Expected Outcomes Short Term: Increase workloads from initial exercise prescription for resistance, speed, and METs.;Short Term: Perform resistance training exercises routinely during rehab and add in resistance training at  home;Long Term: Improve cardiorespiratory fitness, muscular endurance and strength as measured by increased METs and functional capacity (6MWT) Short Term: Increase workloads from initial exercise prescription for resistance, speed, and METs.;Short Term: Perform resistance training exercises routinely during rehab and add in resistance training at home;Long Term: Improve cardiorespiratory fitness, muscular endurance and strength as measured by increased METs and functional capacity (6MWT) Short Term: Increase workloads from initial exercise prescription for resistance, speed, and METs.;Short Term: Perform resistance training exercises routinely during rehab and add in resistance training at home;Long Term: Improve cardiorespiratory fitness, muscular endurance and strength as measured by increased METs and functional capacity (6MWT)     Able to understand and use rate of perceived exertion (RPE) scale Yes Yes Yes     Intervention Provide education and explanation on how to use RPE scale Provide education and explanation on how to use RPE scale Provide education and explanation on how to use RPE scale     Expected Outcomes Short Term: Able to use RPE daily in rehab to express subjective intensity level;Long Term:  Able to use RPE to guide intensity level when exercising independently Short Term: Able to use RPE daily in rehab to express subjective intensity level;Long Term:  Able to use RPE to guide intensity level when exercising independently Short Term: Able to use RPE daily in rehab to express subjective intensity level;Long Term:  Able to use RPE to  guide intensity level when exercising independently     Knowledge and understanding of Target Heart Rate Range (THRR) Yes Yes Yes     Intervention Provide education and explanation of THRR including how the numbers were predicted and where they are located for reference Provide education and explanation of THRR including how the numbers were predicted and where  they are located for reference Provide education and explanation of THRR including how the numbers were predicted and where they are located for reference     Expected Outcomes Short Term: Able to state/look up THRR;Short Term: Able to use daily as guideline for intensity in rehab;Long Term: Able to use THRR to govern intensity when exercising independently Short Term: Able to state/look up THRR;Short Term: Able to use daily as guideline for intensity in rehab;Long Term: Able to use THRR to govern intensity when exercising independently Short Term: Able to state/look up THRR;Short Term: Able to use daily as guideline for intensity in rehab;Long Term: Able to use THRR to govern intensity when exercising independently     Able to check pulse independently Yes Yes Yes     Intervention Provide education and demonstration on how to check pulse in carotid and radial arteries.;Review the importance of being able to check your own pulse for safety during independent exercise Provide education and demonstration on how to check pulse in carotid and radial arteries.;Review the importance of being able to check your own pulse for safety during independent exercise Provide education and demonstration on how to check pulse in carotid and radial arteries.;Review the importance of being able to check your own pulse for safety during independent exercise     Expected Outcomes Short Term: Able to explain why pulse checking is important during independent exercise;Long Term: Able to check pulse independently and accurately Short Term: Able to explain why pulse checking is important during independent exercise;Long Term: Able to check pulse independently and accurately Short Term: Able to explain why pulse checking is important during independent exercise;Long Term: Able to check pulse independently and accurately     Understanding of Exercise Prescription Yes Yes Yes     Intervention Provide education, explanation, and written  materials on patient's individual exercise prescription Provide education, explanation, and written materials on patient's individual exercise prescription Provide education, explanation, and written materials on patient's individual exercise prescription     Expected Outcomes Short Term: Able to explain program exercise prescription;Long Term: Able to explain home exercise prescription to exercise independently Short Term: Able to explain program exercise prescription;Long Term: Able to explain home exercise prescription to exercise independently Short Term: Able to explain program exercise prescription;Long Term: Able to explain home exercise prescription to exercise independently              Exercise Goals Re-Evaluation :  Exercise Goals Re-Evaluation     Row Name 09/16/21 0814 10/13/21 1459 11/10/21 1542         Exercise Goal Re-Evaluation   Exercise Goals Review Increase Physical Activity;Increase Strength and Stamina;Able to understand and use rate of perceived exertion (RPE) scale;Knowledge and understanding of Target Heart Rate Range (THRR);Able to check pulse independently;Understanding of Exercise Prescription Increase Physical Activity;Increase Strength and Stamina;Able to understand and use rate of perceived exertion (RPE) scale;Knowledge and understanding of Target Heart Rate Range (THRR);Able to check pulse independently;Understanding of Exercise Prescription Increase Physical Activity;Increase Strength and Stamina;Able to understand and use rate of perceived exertion (RPE) scale;Knowledge and understanding of Target Heart Rate Range (THRR);Able to check pulse independently;Understanding  of Exercise Prescription     Comments Pt has completed 3 sessions of cardiac rehab. She is coming into class motivated to improve her health. She gets tired walking on the treadmill but stays on for the 17 mins. She participates in the warmup but does not complete some of the exercises due to her R  shoulder hurting. She is currently exercising at 2.30 METs on the treadmill. Will continue to monitor and progress as able. Pt has completed 8 sessions of cardiac rehab. She states that she feels tired when she is in class and does not push herself. She no longer wants to walk on the treadmill and does the ellp for both sessions. She is on her phone during the class and does her bare minimum. She is currently exercising at 1.0 METs on the ellp. Will continue to monitor and progress as able. Pt has completed 14 sessions of cardiac rehab. She does not push herself while in class and does the bare minimum. She continues to no longer want to walk on the treadmill and does the ellp for both sets. She is on her phone the whole class while barly moving on the ellp. She is currently exercising at 1.3 METs on the ellp. Will continue to monitor and progress as able.     Expected Outcomes Through exercise at rehab and at home, the patient will meet their stated goals. Through exercise at rehab and at home, the patient will meet their stated goals. Through exercise at rehab and at home, the patient will meet their stated goals.               Discharge Exercise Prescription (Final Exercise Prescription Changes):  Exercise Prescription Changes - 11/10/21 1500       Response to Exercise   Blood Pressure (Admit) 126/60    Blood Pressure (Exercise) 160/60    Blood Pressure (Exit) 110/50    Heart Rate (Admit) 74 bpm    Heart Rate (Exercise) 96 bpm    Heart Rate (Exit) 80 bpm    Rating of Perceived Exertion (Exercise) 11    Duration Continue with 30 min of aerobic exercise without signs/symptoms of physical distress.    Intensity THRR unchanged      Progression   Progression Continue to progress workloads to maintain intensity without signs/symptoms of physical distress.      Resistance Training   Training Prescription Yes    Weight 2    Reps 10-15    Time 10 Minutes      Recumbant Elliptical   Level  1    RPM 35    Minutes 39    METs 1.3             Nutrition:  Target Goals: Understanding of nutrition guidelines, daily intake of sodium '1500mg'$ , cholesterol '200mg'$ , calories 30% from fat and 7% or less from saturated fats, daily to have 5 or more servings of fruits and vegetables.  Biometrics:  Pre Biometrics - 09/03/21 1452       Pre Biometrics   Height '5\' 11"'$  (1.803 m)    Weight 82.4 kg    Waist Circumference 40 inches    Hip Circumference 40 inches    Waist to Hip Ratio 1 %    BMI (Calculated) 25.35    Triceps Skinfold 36 mm    % Body Fat 39.5 %    Grip Strength 21.4 kg    Flexibility 0 in    Single Leg Stand 6.08 seconds  Nutrition Therapy Plan and Nutrition Goals:  Nutrition Therapy & Goals - 09/03/21 1331       Intervention Plan   Intervention Nutrition handout(s) given to patient.    Expected Outcomes Short Term Goal: Understand basic principles of dietary content, such as calories, fat, sodium, cholesterol and nutrients.             Nutrition Assessments:  Nutrition Assessments - 09/03/21 1332       MEDFICTS Scores   Pre Score 25            MEDIFICTS Score Key: ?70 Need to make dietary changes  40-70 Heart Healthy Diet ? 40 Therapeutic Level Cholesterol Diet  Flowsheet Row Cardiac Rehab from 01/30/2021 in [facility]  Picture Your Plate Total Score on Admission 51      Picture Your Plate Scores: <02 Unhealthy dietary pattern with much room for improvement. 41-50 Dietary pattern unlikely to meet recommendations for good health and room for improvement. 51-60 More healthful dietary pattern, with some room for improvement.  >60 Healthy dietary pattern, although there may be some specific behaviors that could be improved.    Nutrition Goals Re-Evaluation:   Nutrition Goals Discharge (Final Nutrition Goals Re-Evaluation):   Psychosocial: Target Goals: Acknowledge presence or absence of  significant depression and/or stress, maximize coping skills, provide positive support system. Participant is able to verbalize types and ability to use techniques and skills needed for reducing stress and depression.  Initial Review & Psychosocial Screening:  Initial Psych Review & Screening - 09/03/21 1331       Initial Review   Current issues with None Identified      Family Dynamics   Good Support System? Yes    Comments Her daughter and her sister are her main support system.      Barriers   Psychosocial barriers to participate in program There are no identifiable barriers or psychosocial needs.;The patient should benefit from training in stress management and relaxation.      Screening Interventions   Interventions Encouraged to exercise;Provide feedback about the scores to participant;To provide support and resources with identified psychosocial needs    Expected Outcomes Long Term goal: The participant improves quality of Life and PHQ9 Scores as seen by post scores and/or verbalization of changes;Short Term goal: Identification and review with participant of any Quality of Life or Depression concerns found by scoring the questionnaire.             Quality of Life Scores:  Quality of Life - 09/03/21 1452       Quality of Life   Select Quality of Life      Quality of Life Scores   Health/Function Pre 18.11 %    Socioeconomic Pre 23.79 %    Psych/Spiritual Pre 20.57 %    Family Pre 20.13 %    GLOBAL Pre 20.14 %            Scores of 19 and below usually indicate a poorer quality of life in these areas.  A difference of  2-3 points is a clinically meaningful difference.  A difference of 2-3 points in the total score of the Quality of Life Index has been associated with significant improvement in overall quality of life, self-image, physical symptoms, and general health in studies assessing change in quality of life.  PHQ-9: Review Flowsheet       09/03/2021  03/17/2021 03/03/2021 01/30/2021  Depression screen PHQ 2/9  Decreased Interest 1 0 2  1  Down, Depressed, Hopeless 0 0 0 1  PHQ - 2 Score 1 0 2 2  Altered sleeping '1 1 3 2  '$ Tired, decreased energy '1 1 2 3  '$ Change in appetite 0 '1 3 1  '$ Feeling bad or failure about yourself  0 0 0 0  Trouble concentrating 1 0 1 -  Moving slowly or fidgety/restless 0 0 0 2  Suicidal thoughts 0 0 0 0  PHQ-9 Score '4 3 11 10  '$ Difficult doing work/chores Somewhat difficult Somewhat difficult Somewhat difficult Somewhat difficult   Interpretation of Total Score  Total Score Depression Severity:  1-4 = Minimal depression, 5-9 = Mild depression, 10-14 = Moderate depression, 15-19 = Moderately severe depression, 20-27 = Severe depression   Psychosocial Evaluation and Intervention:  Psychosocial Evaluation - 09/03/21 1453       Psychosocial Evaluation & Interventions   Interventions Encouraged to exercise with the program and follow exercise prescription    Comments Pt has no barriers to participating in CR. She has no identifiable psychosocial issues. 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 She participated in [place] after this, and it did help some, but she reports she never got back to her baseline. She then went down hill again when she received another stent earlier this year. She reports that she has a good support system with her daughter and her sister. She has lost weight in the last few months and reports making several dietary changes in order to have better control of her heart disease and her diabetes. 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 She states that the job is just too physical for her. She was not interested in Vocational Rehab though. Her goals while in the program are to improve her strength  and stamina. She is eager to start the program.    Expected Outcomes Pt will continue to have no identifiable psychosocial issues.    Continue Psychosocial Services  No Follow up required             Psychosocial Re-Evaluation:  Psychosocial Re-Evaluation     Benson Name 09/08/21 1010 10/06/21 1105 11/03/21 1233         Psychosocial Re-Evaluation   Current issues with None Identified None Identified None Identified     Comments Patient plans to start the program today. We will continue to monitor her psychosocial progress. Patient has completed 6 sessions. She continues to have no psychosocial barriers identified. Her attendance has been inconsistent. She does demonstrate an interest in improving her health. We will continue to monitor. Patient has completed 11 sessions. She continues to have no psychosocial barriers identified. Her attendance continues to be inconsistent.  We will continue to monitor.     Expected Outcomes Patient will continue to have no psychosocial barriers identified. Patient will continue to have no psychosocial barriers identified. Patient will continue to have no psychosocial barriers identified.     Interventions Stress management education;Relaxation education;Encouraged to attend Cardiac Rehabilitation for the exercise Stress management education;Relaxation education;Encouraged to attend Cardiac Rehabilitation for the exercise Stress management education;Relaxation education;Encouraged to attend Cardiac Rehabilitation for the exercise     Continue Psychosocial Services  No Follow up required No Follow up required No Follow up required  Psychosocial Discharge (Final Psychosocial Re-Evaluation):  Psychosocial Re-Evaluation - 11/03/21 1233       Psychosocial Re-Evaluation   Current issues with None Identified    Comments Patient has completed 11 sessions. She continues to have no psychosocial barriers identified. Her attendance continues to be  inconsistent.  We will continue to monitor.    Expected Outcomes Patient will continue to have no psychosocial barriers identified.    Interventions Stress management education;Relaxation education;Encouraged to attend Cardiac Rehabilitation for the exercise    Continue Psychosocial Services  No Follow up required             Vocational Rehabilitation: Provide vocational rehab assistance to qualifying candidates.   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             Education: Education Goals: Education classes will be provided on a weekly basis, covering required topics. Participant will state understanding/return demonstration of topics presented.  Learning Barriers/Preferences:  Learning Barriers/Preferences - 09/03/21 1334       Learning Barriers/Preferences   Learning Barriers None    Learning Preferences Written Material;Skilled Demonstration;Individual Instruction             Education Topics: Hypertension, Hypertension Reduction -Define heart disease and high blood pressure. Discus how high blood pressure affects the body and ways to reduce high blood pressure.   Exercise and Your Heart -Discuss why it is important to exercise, the FITT principles of exercise, normal and abnormal responses to exercise, and how to exercise safely. Flowsheet Row CARDIAC REHAB PHASE II EXERCISE from 11/05/2021 in [place]  Date 11/05/21  Educator [place]  Instruction Review Code 1- Verbalizes Understanding       Angina -Discuss definition of angina, causes of angina, treatment of angina, and how to decrease risk of having  angina.   Cardiac Medications -Review what the following cardiac medications are used for, how they affect the body, and side effects that may occur when taking the medications.  Medications include Aspirin, Beta blockers, calcium channel blockers, ACE Inhibitors, angiotensin receptor blockers, diuretics, digoxin, and antihyperlipidemics.   Congestive Heart Failure -Discuss the definition of CHF, how to live with CHF, the signs and symptoms of CHF, and how keep track of weight and sodium intake.   Heart Disease and Intimacy -Discus the effect sexual activity has on the heart, how changes occur during intimacy as we age, and safety during sexual activity.   Smoking Cessation / COPD -Discuss different methods to quit smoking, the health benefits of quitting smoking, and the definition of COPD. Flowsheet Row CARDIAC REHAB PHASE II EXERCISE from 11/05/2021 in [place]  Date 09/10/21  Educator DF  Instruction Review Code 1- Verbalizes Understanding       Nutrition I: Fats -Discuss the types of cholesterol, what cholesterol does to the heart, and how cholesterol levels can be controlled. Flowsheet Row CARDIAC REHAB PHASE II EXERCISE from 11/05/2021 in [place]  Date 09/17/21  Educator [place]  Instruction Review Code 1- Verbalizes Understanding       Nutrition II: Labels -Discuss the different components of food labels and how to  read food label   Heart Parts/Heart Disease and PAD -Discuss the anatomy of the heart, the pathway of blood circulation through the heart, and these are affected by heart disease. Flowsheet Row CARDIAC REHAB PHASE II EXERCISE from 11/05/2021 in [place]  Date 10/08/21  Educator [place]  Instruction Review Code 1- Verbalizes Understanding       Stress I: Signs and Symptoms -Discuss the causes of stress, how stress may lead to anxiety and depression, and ways to limit stress. Flowsheet  Row CARDIAC REHAB PHASE II EXERCISE from 11/05/2021 in [place]  Date 10/15/21  Educator [place]  Instruction Review Code 1- Verbalizes Understanding       Stress II: Relaxation -Discuss different types of relaxation techniques to limit stress.   Warning Signs of Stroke / TIA -Discuss definition of a stroke, what the signs and symptoms are of a stroke, and how to identify when someone is having stroke.   Knowledge Questionnaire Score:  Knowledge Questionnaire Score - 09/03/21 1335       Knowledge Questionnaire Score   Pre Score 23/28             Core Components/Risk Factors/Patient Goals at Admission:  Personal Goals and Risk Factors at Admission - 09/03/21 1338       Core Components/Risk Factors/Patient Goals on Admission   Diabetes Yes    Intervention Provide education about signs/symptoms and action to take for hypo/hyperglycemia.;Provide education about proper nutrition, including hydration, and aerobic/resistive exercise prescription along with prescribed medications to achieve blood glucose in normal ranges: Fasting glucose 65-99 mg/dL    Expected Outcomes Short Term: Participant verbalizes understanding of the signs/symptoms and immediate care of hyper/hypoglycemia, proper foot care and importance of medication, aerobic/resistive exercise and nutrition plan for blood glucose control.;Long Term: Attainment of HbA1C < 7%.    Hypertension Yes    Intervention Provide education on lifestyle modifcations including regular physical activity/exercise, weight management, moderate sodium restriction and increased consumption of fresh fruit, vegetables, and low fat dairy, alcohol moderation, and smoking cessation.;Monitor prescription use compliance.    Expected Outcomes Short Term: Continued assessment and intervention until BP is < 140/80m HG in hypertensive participants. < 130/836mHG in hypertensive participants with diabetes, heart failure or chronic kidney  disease.;Long Term: Maintenance of blood pressure at goal levels.    Lipids Yes    Intervention Provide education and support for participant on nutrition & aerobic/resistive exercise along with prescribed medications to achieve LDL '70mg'$ , HDL >'40mg'$ .    Expected Outcomes Short Term: Participant states understanding of desired cholesterol values and is compliant with medications prescribed. Participant is following exercise prescription and nutrition guidelines.;Long Term: Cholesterol controlled with medications as prescribed, with individualized exercise RX and with personalized nutrition plan. Value goals: LDL < '70mg'$ , HDL > 40 mg.    Personal Goal Other Yes    Personal Goal Improve strength and stamina    Intervention Attend CR twice per week and begin a home exercise program atleast two days per week.    Expected Outcomes Pt will report improvements in her strength and stamina, and she will be able to increase her walk test distance.             Core Components/Risk Factors/Patient Goals Review:   Goals and Risk Factor Review     Row Name 09/08/21 1013 10/06/21 1109 11/03/21 1235         Core Components/Risk Factors/Patient Goals Review   Personal Goals Review Diabetes;Hypertension;Lipids;Other Diabetes;Hypertension;Lipids;Other --  Review Patient referred to CR with Stent placement. She has multiple risk factors for CAD and is participating in the program for risk modification. She plans to start the program today. Her last A1C was 07/15/21 at 11.1% up from 9.4% 9 months ago. Her DM is managed with Insulin, Tresiba, and Metformin. Her personal goals for the program are to improve her strenght and stamina. We will continue to monitor her progress as she works towards meeting these goals. Patient has completed 6 sessions. Her current weight is 184.1 lbs down 2 lbs from her initial weight. Patient saw Dr. Virgina Jock 6/7 for routine follow up visit. Her blood pressure was elevated 162/77 at  his office. Her blood pressures have been controlled in CR with highest systolic reading of 161. He added Labetalol 200 mg bid and changed metoprolol to coreg 12.5 mg bid due to fatigue and normal EF. Her last A1C was 3/23 at 11% and he encouraged patient to follow closely with pcp for better management. She in on Novolog, Tresiba, and Metformin for DM controll. Her 2 reported reading for [place] have been >200 mg/dl. Her attendance has not been consistent. She reported angina to Dr. Virgina Jock with exerction but she has not complained of chest pain in cardiac rehab. Her personal goals for the program continue to be to improve her strength and stamina. We will continue to monitor her progress as she works towards meeting these goals. Patient has completed 11 sessions. Her current weight is 183.1 lbs down 1 lb from last 30 day review. Her last A1C was 3/23 at 11%.  She in on [place], Tresiba, and Metformin for DM control.  Her attendance continues to be inconsistent. She has not been progressed. She does the seated elliptical both stations and averages 25 rpms.  Her personal goals for the program continue to be to improve her strength and stamina. We will continue to monitor her progress as she works towards meeting these goals.     Expected Outcomes Patient will complete the program meeting both personal and program goals. Patient will complete the program meeting both personal and program goals. Patient will complete the program meeting both personal and program goals.              Core Components/Risk Factors/Patient Goals at Discharge (Final Review):   Goals and Risk Factor Review - 11/03/21 1235       Core Components/Risk Factors/Patient Goals Review   Review Patient has completed 11 sessions. Her current weight is 183.1 lbs down 1 lb from last 30 day review. Her last A1C was 3/23 at 11%.  She in on [place], Tresiba, and Metformin for DM control.  Her attendance continues to be inconsistent. She has not  been progressed. She does the seated elliptical both stations and averages 25 rpms.  Her personal goals for the program continue to be to improve her strength and stamina. We will continue to monitor her progress as she works towards meeting these goals.    Expected Outcomes Patient will complete the program meeting both personal and program goals.             ITP Comments:   Comments: ITP REVIEW Pt is making expected progress toward Cardiac Rehab goals after completing 14 sessions. Recommend continued exercise, life style modification, education, and increased stamina and strength.

## 2021-11-12 NOTE — Progress Notes (Signed)
Daily Session Note  Patient Details  Name: Monica Bell MRN: [id number] Date of Birth: 1960-05-28 Referring Provider:   Flowsheet Row CARDIAC REHAB PHASE II ORIENTATION from 09/03/2021 in [place]  Referring Provider Dr. Virgina Jock       Encounter Date: 11/12/2021  Check In:  Session Check In - 11/12/21 1436       Check-In   Supervising physician immediately available to respond to emergencies [facility] MD immediately available    Physician(s) Dr Zandra Abts    Location [facility]    Staff Present Redge Gainer, BS, Exercise Physiologist;Issak Goley Hassell Done, RN, Bjorn Loser, MS, ACSM-CEP, Exercise Physiologist    Virtual Visit No    Medication changes reported     No    Comments Dr. Virgina Jock discontinued Metoprolol and added coreg 12.5 mg bid and Labetalol 200 mg bid.    Fall or balance concerns reported    No    Tobacco Cessation No Change    Warm-up and Cool-down Performed as group-led instruction    Resistance Training Performed Yes    VAD Patient? No    PAD/SET Patient? No      Pain Assessment   Currently in Pain? No/denies    Multiple Pain Sites No             Capillary Blood Glucose: No results found for this or any previous visit (from the past 24 hour(s)).    Social History   Tobacco Use  Smoking Status Former   Packs/day: 0.50   Years: 15.00   Total pack years: 7.50   Types: Cigarettes   Quit date: 11/2020   Years since quitting: 0.9  Smokeless Tobacco Never    Goals Met:  Independence with exercise equipment Exercise tolerated well No report of concerns or symptoms today Strength training completed today  Goals Unmet:  Not Applicable  Comments: Checkout at 1545.   Dr. Carlyle Dolly is Medical Director for [facility] Card[facility]

## 2021-11-14 ENCOUNTER — Encounter (HOSPITAL_COMMUNITY): Payer: BC Managed Care – PPO

## 2021-11-17 ENCOUNTER — Encounter (HOSPITAL_COMMUNITY)
Admission: RE | Admit: 2021-11-17 | Discharge: 2021-11-17 | Disposition: A | Discharge status: Home / Self Care | Payer: BC Managed Care – PPO | Source: Ambulatory Visit | Attending: Cardiology | Admitting: Cardiology

## 2021-11-17 DIAGNOSIS — I214 Non-ST elevation (NSTEMI) myocardial infarction: Secondary | ICD-10-CM

## 2021-11-17 DIAGNOSIS — Z955 Presence of coronary angioplasty implant and graft: Secondary | ICD-10-CM | POA: Diagnosis not present

## 2021-11-17 NOTE — Progress Notes (Signed)
Daily Session Note  Patient Details  Name: Monica Bell MRN: [id number] Date of Birth: 07/15/1960 Referring Provider:   Flowsheet Row CARDIAC REHAB PHASE II ORIENTATION from 09/03/2021 in [place]  Referring Provider Dr. Virgina Jock       Encounter Date: 11/17/2021  Check In:  Session Check In - 11/17/21 1445       Check-In   Supervising physician immediately available to respond to emergencies [facility] MD immediately available    Physician(s) Dr. Domenic Polite    Location [facility]    Staff Present Geanie Cooley, RN;Dalton Kris Mouton, MS, ACSM-CEP, Exercise Physiologist;Daphyne Hassell Done, RN, BSN    Virtual Visit No    Medication changes reported     No    Fall or balance concerns reported    No    Tobacco Cessation No Change    Warm-up and Cool-down Performed as group-led instruction    Resistance Training Performed Yes    VAD Patient? No    PAD/SET Patient? No      Pain Assessment   Currently in Pain? No/denies    Multiple Pain Sites No             Capillary Blood Glucose: No results found for this or any previous visit (from the past 24 hour(s)).    Social History   Tobacco Use  Smoking Status Former   Packs/day: 0.50   Years: 15.00   Total pack years: 7.50   Types: Cigarettes   Quit date: 11/2020   Years since quitting: 0.9  Smokeless Tobacco Never    Goals Met:  Independence with exercise equipment Exercise tolerated well No report of concerns or symptoms today Strength training completed today  Goals Unmet:  Not Applicable  Comments: check out @ 3:45pm   Dr. Carlyle Dolly is Medical Director for [place]

## 2021-11-19 ENCOUNTER — Encounter (HOSPITAL_COMMUNITY): Payer: BC Managed Care – PPO

## 2021-11-21 ENCOUNTER — Encounter (HOSPITAL_COMMUNITY): Payer: BC Managed Care – PPO

## 2021-11-24 ENCOUNTER — Encounter (HOSPITAL_COMMUNITY)
Admission: RE | Admit: 2021-11-24 | Discharge: 2021-11-24 | Disposition: A | Discharge status: Home / Self Care | Payer: BC Managed Care – PPO | Source: Ambulatory Visit | Attending: Cardiology | Admitting: Cardiology

## 2021-11-24 VITALS — Wt 182.5 lb

## 2021-11-24 DIAGNOSIS — Z955 Presence of coronary angioplasty implant and graft: Secondary | ICD-10-CM | POA: Diagnosis not present

## 2021-11-24 DIAGNOSIS — I214 Non-ST elevation (NSTEMI) myocardial infarction: Secondary | ICD-10-CM

## 2021-11-24 NOTE — Progress Notes (Signed)
Daily Session Note  Patient Details  Name: Monica Bell MRN: [id number] Date of Birth: March 02, 1961 Referring Provider:   Flowsheet Row CARDIAC REHAB PHASE II ORIENTATION from 09/03/2021 in [place]  Referring Provider Dr. Virgina Jock       Encounter Date: 11/24/2021  Check In:  Session Check In - 11/24/21 1445       Check-In   Supervising physician immediately available to respond to emergencies [facility] MD immediately available    Physician(s) Dr.  Harrington Challenger    Location [facility]    Staff Present Geanie Cooley, RN;Heather Otho Ket, BS, Exercise Physiologist;Debra Wynetta Emery, RN, Bjorn Loser, MS, ACSM-CEP, Exercise Physiologist    Virtual Visit No    Medication changes reported     No    Fall or balance concerns reported    No    Tobacco Cessation No Change    Warm-up and Cool-down Performed as group-led instruction    Resistance Training Performed Yes    VAD Patient? No    PAD/SET Patient? No      Pain Assessment   Currently in Pain? No/denies    Multiple Pain Sites No             Capillary Blood Glucose: No results found for this or any previous visit (from the past 24 hour(s)).    Social History   Tobacco Use  Smoking Status Former   Packs/day: 0.50   Years: 15.00   Total pack years: 7.50   Types: Cigarettes   Quit date: 11/2020   Years since quitting: 0.9  Smokeless Tobacco Never    Goals Met:  Independence with exercise equipment Exercise tolerated well No report of concerns or symptoms today Strength training completed today  Goals Unmet:  Not Applicable  Comments: check ou t@ 3:45pm   Dr. Carlyle Dolly is Medical Director for [place]

## 2021-11-26 ENCOUNTER — Encounter (HOSPITAL_COMMUNITY): Payer: BC Managed Care – PPO

## 2021-12-01 NOTE — Progress Notes (Signed)
Discharge Progress Report  Patient Details  Name: Monica Bell MRN: [id number] Date of Birth: 11-30-1960 Referring Provider:   Flowsheet Row CARDIAC REHAB PHASE II ORIENTATION from 09/03/2021 in [facility]  Referring Provider Dr. Virgina Jock        Number of Visits: 17  Reason for Discharge:  Early Exit:  Lack of attendance  Smoking History:  Social History   Tobacco Use  Smoking Status Former   Packs/day: 0.50   Years: 15.00   Total pack years: 7.50   Types: Cigarettes   Quit date: 11/2020   Years since quitting: 1.0  Smokeless Tobacco Never    Diagnosis:  Status post coronary artery stent placement  NSTEMI (non-ST elevated myocardial infarction) ([place])  ADL [facility]:   Initial Exercise Prescription:  Initial Exercise Prescription - 09/03/21 1400       Date of Initial Exercise RX and Referring Provider   Date 09/03/21    Referring Provider Dr. Virgina Jock    Expected Discharge Date 11/28/21      Treadmill   MPH 1.4    Grade 0    Minutes 17      Recumbant Elliptical   Level 1    RPM 50    Minutes 22      Prescription Details   Frequency (times per week) 3    Duration Progress to 30 minutes of continuous aerobic without signs/symptoms of physical distress      Intensity   THRR 40-80% of Max Heartrate 64-128    Ratings of Perceived Exertion 11-13    Perceived Dyspnea 0-4      Resistance Training   Training Prescription Yes    Weight 2    Reps 10-15             Discharge Exercise Prescription (Final Exercise Prescription Changes):  Exercise Prescription Changes - 11/24/21 1500       Response to Exercise   Blood Pressure (Admit) 120/56    Blood Pressure (Exercise) 140/60    Blood Pressure (Exit) 120/60    Heart Rate (Admit) 88 bpm    Heart Rate (Exercise) 105 bpm    Heart Rate (Exit) 84 bpm    Rating of Perceived Exertion (Exercise) 11    Duration Continue with 30 min of aerobic exercise without signs/symptoms  of physical distress.    Intensity THRR unchanged      Progression   Progression Continue to progress workloads to maintain intensity without signs/symptoms of physical distress.      Resistance Training   Training Prescription Yes    Weight 2    Reps 10-15    Time 1 Minutes      Recumbant Elliptical   Level 1    RPM 34    Minutes 39    METs 1.8             Functional Capacity:  6 Minute Walk     Row Name 09/03/21 1448         6 Minute Walk   Phase Initial     Distance 750 feet     Walk Time 6 minutes     # of Rest Breaks 0     MPH 1.42     METS 2.35     RPE 11     VO2 Peak 8.24     Symptoms Yes (comment)     Comments burning in center of chest 3/10. Resolved with rest     Resting HR 75 bpm  Resting BP 106/54     Resting Oxygen Saturation  98 %     Exercise Oxygen Saturation  during 6 min walk 97 %     Max Ex. HR 76 bpm     Max Ex. BP 112/60     2 Minute Post BP 120/68              Psychological, QOL, Others - Outcomes: PHQ 2/9:    09/03/2021    1:28 PM 03/17/2021    2:15 PM 03/03/2021    2:24 PM 01/30/2021    4:50 PM  Depression screen PHQ 2/9  Decreased Interest 1 0 2 1  Down, Depressed, Hopeless 0 0 0 1  PHQ - 2 Score 1 0 2 2  Altered sleeping '1 1 3 2  '$ Tired, decreased energy '1 1 2 3  '$ Change in appetite 0 '1 3 1  '$ Feeling bad or failure about yourself  0 0 0 0  Trouble concentrating 1 0 1   Moving slowly or fidgety/restless 0 0 0 2  Suicidal thoughts 0 0 0 0  PHQ-9 Score '4 3 11 10  '$ Difficult doing work/chores Somewhat difficult Somewhat difficult Somewhat difficult Somewhat difficult    Quality of Life:  Quality of Life - 09/03/21 1452       Quality of Life   Select Quality of Life      Quality of Life Scores   Health/Function Pre 18.11 %    Socioeconomic Pre 23.79 %    Psych/Spiritual Pre 20.57 %    Family Pre 20.13 %    GLOBAL Pre 20.14 %             Personal Goals: Goals established at orientation with  interventions provided to work toward goal.  Personal Goals and Risk Factors at Admission - 09/03/21 1338       Core Components/Risk Factors/Patient Goals on Admission   Diabetes Yes    Intervention Provide education about signs/symptoms and action to take for hypo/hyperglycemia.;Provide education about proper nutrition, including hydration, and aerobic/resistive exercise prescription along with prescribed medications to achieve blood glucose in normal ranges: Fasting glucose 65-99 mg/dL    Expected Outcomes Short Term: Participant verbalizes understanding of the signs/symptoms and immediate care of hyper/hypoglycemia, proper foot care and importance of medication, aerobic/resistive exercise and nutrition plan for blood glucose control.;Long Term: Attainment of HbA1C < 7%.    Hypertension Yes    Intervention Provide education on lifestyle modifcations including regular physical activity/exercise, weight management, moderate sodium restriction and increased consumption of fresh fruit, vegetables, and low fat dairy, alcohol moderation, and smoking cessation.;Monitor prescription use compliance.    Expected Outcomes Short Term: Continued assessment and intervention until BP is < 140/78m HG in hypertensive participants. < 130/817mHG in hypertensive participants with diabetes, heart failure or chronic kidney disease.;Long Term: Maintenance of blood pressure at goal levels.    Lipids Yes    Intervention Provide education and support for participant on nutrition & aerobic/resistive exercise along with prescribed medications to achieve LDL '70mg'$ , HDL >'40mg'$ .    Expected Outcomes Short Term: Participant states understanding of desired cholesterol values and is compliant with medications prescribed. Participant is following exercise prescription and nutrition guidelines.;Long Term: Cholesterol controlled with medications as prescribed, with individualized exercise RX and with personalized nutrition plan. Value  goals: LDL < '70mg'$ , HDL > 40 mg.    Personal Goal Other Yes    Personal Goal Improve strength and stamina    Intervention Attend CR twice  per week and begin a home exercise program atleast two days per week.    Expected Outcomes Pt will report improvements in her strength and stamina, and she will be able to increase her walk test distance.              Personal Goals Discharge:  Goals and Risk Factor Review     Row Name 09/08/21 1013 10/06/21 1109 11/03/21 1235         Core Components/Risk Factors/Patient Goals Review   Personal Goals Review Diabetes;Hypertension;Lipids;Other Diabetes;Hypertension;Lipids;Other --     Review Patient referred to CR with Stent placement. She has multiple risk factors for CAD and is participating in the program for risk modification. She plans to start the program today. Her last A1C was 07/15/21 at 11.1% up from 9.4% 9 months ago. Her DM is managed with Insulin, Tresiba, and Metformin. Her personal goals for the program are to improve her strenght and stamina. We will continue to monitor her progress as she works towards meeting these goals. Patient has completed 6 sessions. Her current weight is 184.1 lbs down 2 lbs from her initial weight. Patient saw Dr. Virgina Jock 6/7 for routine follow up visit. Her blood pressure was elevated 162/77 at his office. Her blood pressures have been controlled in CR with highest systolic reading of 147. He added Labetalol 200 mg bid and changed metoprolol to coreg 12.5 mg bid due to fatigue and normal EF. Her last A1C was 3/23 at 11% and he encouraged patient to follow closely with pcp for better management. She in on Novolog, Tresiba, and Metformin for DM controll. Her 2 reported reading for [place] have been >200 mg/dl. Her attendance has not been consistent. She reported angina to Dr. Virgina Jock with exerction but she has not complained of chest pain in cardiac rehab. Her personal goals for the program continue to be to improve her  strength and stamina. We will continue to monitor her progress as she works towards meeting these goals. Patient has completed 11 sessions. Her current weight is 183.1 lbs down 1 lb from last 30 day review. Her last A1C was 3/23 at 11%.  She in on [place], Tresiba, and Metformin for DM control.  Her attendance continues to be inconsistent. She has not been progressed. She does the seated elliptical both stations and averages 25 rpms.  Her personal goals for the program continue to be to improve her strength and stamina. We will continue to monitor her progress as she works towards meeting these goals.     Expected Outcomes Patient will complete the program meeting both personal and program goals. Patient will complete the program meeting both personal and program goals. Patient will complete the program meeting both personal and program goals.              Exercise Goals and Review:  Exercise Goals     Row Name 09/03/21 1451 09/16/21 0813 10/13/21 1458         Exercise Goals   Increase Physical Activity Yes Yes Yes     Intervention Provide advice, education, support and counseling about physical activity/exercise needs.;Develop an individualized exercise prescription for aerobic and resistive training based on initial evaluation findings, risk stratification, comorbidities and participant's personal goals. Provide advice, education, support and counseling about physical activity/exercise needs.;Develop an individualized exercise prescription for aerobic and resistive training based on initial evaluation findings, risk stratification, comorbidities and participant's personal goals. Provide advice, education, support and counseling about physical activity/exercise needs.;Develop an individualized  exercise prescription for aerobic and resistive training based on initial evaluation findings, risk stratification, comorbidities and participant's personal goals.     Expected Outcomes Short Term: Attend  rehab on a regular basis to increase amount of physical activity.;Long Term: Add in home exercise to make exercise part of routine and to increase amount of physical activity.;Long Term: Exercising regularly at least 3-5 days a week. Short Term: Attend rehab on a regular basis to increase amount of physical activity.;Long Term: Add in home exercise to make exercise part of routine and to increase amount of physical activity.;Long Term: Exercising regularly at least 3-5 days a week. Short Term: Attend rehab on a regular basis to increase amount of physical activity.;Long Term: Add in home exercise to make exercise part of routine and to increase amount of physical activity.;Long Term: Exercising regularly at least 3-5 days a week.     Increase Strength and Stamina Yes Yes Yes     Intervention Provide advice, education, support and counseling about physical activity/exercise needs.;Develop an individualized exercise prescription for aerobic and resistive training based on initial evaluation findings, risk stratification, comorbidities and participant's personal goals. Provide advice, education, support and counseling about physical activity/exercise needs.;Develop an individualized exercise prescription for aerobic and resistive training based on initial evaluation findings, risk stratification, comorbidities and participant's personal goals. Provide advice, education, support and counseling about physical activity/exercise needs.;Develop an individualized exercise prescription for aerobic and resistive training based on initial evaluation findings, risk stratification, comorbidities and participant's personal goals.     Expected Outcomes Short Term: Increase workloads from initial exercise prescription for resistance, speed, and METs.;Short Term: Perform resistance training exercises routinely during rehab and add in resistance training at home;Long Term: Improve cardiorespiratory fitness, muscular endurance and  strength as measured by increased METs and functional capacity (6MWT) Short Term: Increase workloads from initial exercise prescription for resistance, speed, and METs.;Short Term: Perform resistance training exercises routinely during rehab and add in resistance training at home;Long Term: Improve cardiorespiratory fitness, muscular endurance and strength as measured by increased METs and functional capacity (6MWT) Short Term: Increase workloads from initial exercise prescription for resistance, speed, and METs.;Short Term: Perform resistance training exercises routinely during rehab and add in resistance training at home;Long Term: Improve cardiorespiratory fitness, muscular endurance and strength as measured by increased METs and functional capacity (6MWT)     Able to understand and use rate of perceived exertion (RPE) scale Yes Yes Yes     Intervention Provide education and explanation on how to use RPE scale Provide education and explanation on how to use RPE scale Provide education and explanation on how to use RPE scale     Expected Outcomes Short Term: Able to use RPE daily in rehab to express subjective intensity level;Long Term:  Able to use RPE to guide intensity level when exercising independently Short Term: Able to use RPE daily in rehab to express subjective intensity level;Long Term:  Able to use RPE to guide intensity level when exercising independently Short Term: Able to use RPE daily in rehab to express subjective intensity level;Long Term:  Able to use RPE to guide intensity level when exercising independently     Knowledge and understanding of Target Heart Rate Range (THRR) Yes Yes Yes     Intervention Provide education and explanation of THRR including how the numbers were predicted and where they are located for reference Provide education and explanation of THRR including how the numbers were predicted and where they are located for reference Provide  education and explanation of THRR  including how the numbers were predicted and where they are located for reference     Expected Outcomes Short Term: Able to state/look up THRR;Short Term: Able to use daily as guideline for intensity in rehab;Long Term: Able to use THRR to govern intensity when exercising independently Short Term: Able to state/look up THRR;Short Term: Able to use daily as guideline for intensity in rehab;Long Term: Able to use THRR to govern intensity when exercising independently Short Term: Able to state/look up THRR;Short Term: Able to use daily as guideline for intensity in rehab;Long Term: Able to use THRR to govern intensity when exercising independently     Able to check pulse independently Yes Yes Yes     Intervention Provide education and demonstration on how to check pulse in carotid and radial arteries.;Review the importance of being able to check your own pulse for safety during independent exercise Provide education and demonstration on how to check pulse in carotid and radial arteries.;Review the importance of being able to check your own pulse for safety during independent exercise Provide education and demonstration on how to check pulse in carotid and radial arteries.;Review the importance of being able to check your own pulse for safety during independent exercise     Expected Outcomes Short Term: Able to explain why pulse checking is important during independent exercise;Long Term: Able to check pulse independently and accurately Short Term: Able to explain why pulse checking is important during independent exercise;Long Term: Able to check pulse independently and accurately Short Term: Able to explain why pulse checking is important during independent exercise;Long Term: Able to check pulse independently and accurately     Understanding of Exercise Prescription Yes Yes Yes     Intervention Provide education, explanation, and written materials on patient's individual exercise prescription Provide education,  explanation, and written materials on patient's individual exercise prescription Provide education, explanation, and written materials on patient's individual exercise prescription     Expected Outcomes Short Term: Able to explain program exercise prescription;Long Term: Able to explain home exercise prescription to exercise independently Short Term: Able to explain program exercise prescription;Long Term: Able to explain home exercise prescription to exercise independently Short Term: Able to explain program exercise prescription;Long Term: Able to explain home exercise prescription to exercise independently              Exercise Goals Re-Evaluation:  Exercise Goals Re-Evaluation     Row Name 09/16/21 0814 10/13/21 1459 11/10/21 1542         Exercise Goal Re-Evaluation   Exercise Goals Review Increase Physical Activity;Increase Strength and Stamina;Able to understand and use rate of perceived exertion (RPE) scale;Knowledge and understanding of Target Heart Rate Range (THRR);Able to check pulse independently;Understanding of Exercise Prescription Increase Physical Activity;Increase Strength and Stamina;Able to understand and use rate of perceived exertion (RPE) scale;Knowledge and understanding of Target Heart Rate Range (THRR);Able to check pulse independently;Understanding of Exercise Prescription Increase Physical Activity;Increase Strength and Stamina;Able to understand and use rate of perceived exertion (RPE) scale;Knowledge and understanding of Target Heart Rate Range (THRR);Able to check pulse independently;Understanding of Exercise Prescription     Comments Pt has completed 3 sessions of cardiac rehab. She is coming into class motivated to improve her health. She gets tired walking on the treadmill but stays on for the 17 mins. She participates in the warmup but does not complete some of the exercises due to her R shoulder hurting. She is currently exercising at 2.30 METs on  the treadmill.  Will continue to monitor and progress as able. Pt has completed 8 sessions of cardiac rehab. She states that she feels tired when she is in class and does not push herself. She no longer wants to walk on the treadmill and does the ellp for both sessions. She is on her phone during the class and does her bare minimum. She is currently exercising at 1.0 METs on the ellp. Will continue to monitor and progress as able. Pt has completed 14 sessions of cardiac rehab. She does not push herself while in class and does the bare minimum. She continues to no longer want to walk on the treadmill and does the ellp for both sets. She is on her phone the whole class while barly moving on the ellp. She is currently exercising at 1.3 METs on the ellp. Will continue to monitor and progress as able.     Expected Outcomes Through exercise at rehab and at home, the patient will meet their stated goals. Through exercise at rehab and at home, the patient will meet their stated goals. Through exercise at rehab and at home, the patient will meet their stated goals.              Nutrition & Weight - Outcomes:  Pre Biometrics - 09/03/21 1452       Pre Biometrics   Height '5\' 11"'$  (1.803 m)    Weight 181 lb 10.5 oz (82.4 kg)    Waist Circumference 40 inches    Hip Circumference 40 inches    Waist to Hip Ratio 1 %    BMI (Calculated) 25.35    Triceps Skinfold 36 mm    % Body Fat 39.5 %    Grip Strength 21.4 kg    Flexibility 0 in    Single Leg Stand 6.08 seconds              Nutrition:  Nutrition Therapy & Goals - 09/03/21 1331       Intervention Plan   Intervention Nutrition handout(s) given to patient.    Expected Outcomes Short Term Goal: Understand basic principles of dietary content, such as calories, fat, sodium, cholesterol and nutrients.             Nutrition Discharge:  Nutrition Assessments - 09/03/21 1332       MEDFICTS Scores   Pre Score 25             Education Questionnaire  Score:  Knowledge Questionnaire Score - 09/03/21 1335       Knowledge Questionnaire Score   Pre Score 23/28             Pt discharged from CR after 17 sessions. She no called/no showed her last two appointments, so we were unable to complete a discharge assessment. She had inconsistent attendance, and she gave little effort while in the program. It is unlikely that she will continue to exercise post discharge.

## 2021-12-31 ENCOUNTER — Ambulatory Visit: Payer: 59 | Admitting: Cardiology

## 2022-01-02 ENCOUNTER — Encounter: Payer: Self-pay | Admitting: Cardiology

## 2022-01-02 ENCOUNTER — Ambulatory Visit: Payer: 59 | Admitting: Cardiology

## 2022-01-02 VITALS — BP 159/67 | HR 75 | Temp 98.0°F | Resp 16 | Ht 71.0 in | Wt 184.0 lb

## 2022-01-02 DIAGNOSIS — I5020 Unspecified systolic (congestive) heart failure: Secondary | ICD-10-CM

## 2022-01-02 DIAGNOSIS — I25118 Atherosclerotic heart disease of native coronary artery with other forms of angina pectoris: Secondary | ICD-10-CM

## 2022-01-02 DIAGNOSIS — I10 Essential (primary) hypertension: Secondary | ICD-10-CM

## 2022-01-02 MED ORDER — LOSARTAN POTASSIUM 25 MG PO TABS: 25.0000 mg | ORAL_TABLET | Freq: Every day | ORAL | 3 refills | Status: DC

## 2022-01-02 NOTE — Progress Notes (Signed)
Patient referred by [name], NP for coronary artery disease  Subjective:   Monica Bell, female    DOB: July 24, 1960, 61 y.o.   MRN: [id number]  Chief Complaint  Patient presents with   Coronary Artery Disease   Follow-up    3 month    HPI  61 year old African-American female with hypertension, hyperlipidemia, type 2 diabetes mellitus, tobacco dependence, CAD, HFrEF-now recovered EF, GERD  Patient underwent successful mid Lcx CTO PCI (06/2021). Angina is significantly improved, only occurs occasionally now.   Blood pressure is elevated. Diabetes remains uncontrolled.     Current Outpatient Medications:    amLODipine (NORVASC) 5 MG tablet, TAKE 1 TABLET BY MOUTH ONCE DAILY. (Patient taking differently: Take 5 mg by mouth daily.), Disp: 30 tablet, Rfl: 0   BRILINTA 90 MG TABS tablet, TAKE 1 TABLET BY MOUTH TWICE DAILY (Patient taking differently: Take 90 mg by mouth 2 (two) times daily.), Disp: 60 tablet, Rfl: 11   Calcium Carbonate-Vitamin D (CALTRATE 600+D PO), Take 1 tablet by mouth daily., Disp: , Rfl:    carvedilol (COREG) 12.5 MG tablet, Take 1 tablet (12.5 mg total) by mouth 2 (two) times daily., Disp: 180 tablet, Rfl: 3   cetirizine (ZYRTEC) 10 MG tablet, Take 10 mg by mouth daily., Disp: , Rfl:    cholecalciferol (VITAMIN D3) 25 MCG (1000 UNIT) tablet, Take 1,000 Units by mouth daily., Disp: , Rfl:    esomeprazole (NEXIUM) 20 MG capsule, TAKE 1 CAPSULE BY MOUTH ONCE DAILY AT 12 NOON., Disp: 30 capsule, Rfl: 0   GNP ASPIRIN LOW DOSE 81 MG EC tablet, TAKE 1 TABLET BY MOUTH ONCE DAILY. SWALLOW WHOLE. (Patient taking differently: Take 81 mg by mouth daily.), Disp: 90 tablet, Rfl: 1   insulin aspart (NOVOLOG) 100 UNIT/ML injection, Inject 10 Units into the skin 3 (three) times daily before meals., Disp: , Rfl:    Investigational - Study Medication, EMPACT-MI Study  Empagliflozin /or Placebo, Disp: , Rfl:    isosorbide-hydrALAZINE (BIDIL) 20-37.5 MG tablet, Take 1  tablet by mouth 3 (three) times daily. (Patient not taking: Reported on 07/30/2021), Disp: 90 tablet, Rfl: 3   ivabradine (CORLANOR) 5 MG TABS tablet, Take 1 tablet (5 mg total) by mouth 2 (two) times daily with a meal. (Patient not taking: Reported on 07/30/2021), Disp: 60 tablet, Rfl: 2   levothyroxine (SYNTHROID) 137 MCG tablet, Take 137 mcg by mouth daily., Disp: , Rfl:    magnesium oxide (MAG-OX) 400 MG tablet, Take 400 mg by mouth 3 (three) times a week., Disp: , Rfl:    metFORMIN (GLUCOPHAGE) 1000 MG tablet, Take 1,000 mg by mouth 2 (two) times daily with a meal., Disp: , Rfl:    nitroGLYCERIN (NITROSTAT) 0.4 MG SL tablet, Place 1 tablet (0.4 mg total) under the tongue every 5 (five) minutes as needed for chest pain., Disp: 30 tablet, Rfl: 3   rosuvastatin (CRESTOR) 40 MG tablet, Take 1 tablet (40 mg total) by mouth daily., Disp: 30 tablet, Rfl: 0   TRESIBA FLEXTOUCH 200 UNIT/ML FlexTouch Pen, Inject 50 Units into the skin daily., Disp: , Rfl:    vitamin B-12 (CYANOCOBALAMIN) 1000 MCG tablet, Take 1,000 mcg by mouth daily., Disp: , Rfl:   Cardiovascular and other pertinent studies:  EKG 07/30/2021: Sinus rhythm 67 bpm Low voltage, otherwise normal EKG  Coronary intervention 07/15/2021: LM: Mod 40% disease with MLA 7-9 mm2 LAD: Minimal diffuse disease Lcx: Prox 40-50% disease        Mid  Lcx CTO        Patent OM1 stent with no restenosis RCA: Mid 40% disease          Grade 2 collaterals to occluded Lcx   LVEDP normal   (IVUS) guided successful percutaneous coronary intervention mid Lcx (CTO intervention)     PTCA and stent placement 2.25 X 26 mm Onyx Frontier drug-eluting stent     Proximal optimization with 2.75X15 mm [place] balloon at 18 atm     Kissing balloon post stenting angioplasty Lcx/OM1 w/2.75X15 mm and 3.0X8 mm [place] balloons at 12    0% residual stenosis Excellent stent apposition and expansion and ostial mid Lcx coverage                          Echocardiogram  03/18/2021:  Normal LV systolic function with visual EF 60-65%. Left ventricle cavity  is normal in size. Moderate left ventricular hypertrophy. Normal global  wall motion. Indeterminate diastolic filling pattern, indeterminate LAP.  No significant valvular disease.  Small pericardial effusion. There is no hemodynamic significance.  Compared to study 12/06/2020: LVEF improved from 40-45% to 60-65%  otherwise no significant change.   Coronary intervention 12/06/2020: LM: Normal LAD: No significant disease Lcx: Prox thrombotic 95% stenosis, 60% ostial OM1 stenosis        Mid Lcx occlusion, likely chronic RCA: Mid 60% disease. Right-to-left collaterals to occluded Lcx   Successful percutaneous coronary intervention prox Lcx-OM1        PTCA and stent placement 3.0 X 30 mm Onyx Frontier drug-eluting  stent, post dilatation with 3.25X8 and 3.5X15 mm [place] balloons up to 20 atm   Distal embolization into small branches of OM1. Aggrastat bolus and infusion started.   Recent labs: 06/27/2021: Glucose 318, BUN/Cr 9/1.26. EGFR 49. Na/K 138/4.5. Rest of the CMP normal H/H 10.6/34.9. MCV 85. Platelets 350 HbA1C 11.5% Chol 75, TG 82, HDL 344, LDL 15 TSH 0.03 very low Free T4 1.6 normal   Review of Systems  Constitutional: Positive for malaise/fatigue.  Cardiovascular:  Positive for chest pain (Occasional). Negative for dyspnea on exertion, leg swelling, palpitations and syncope.        Vitals:   01/02/22 1359  BP: (!) 159/67  Pulse: 75  Resp: 16  Temp: 98 F (36.7 C)  SpO2: 98%     Body mass index is 25.66 kg/m. Filed Weights   01/02/22 1359  Weight: 184 lb (83.5 kg)     Objective:   Physical Exam Vitals and nursing note reviewed.  Constitutional:      General: She is not in acute distress. Neck:     Vascular: No JVD.  Cardiovascular:     Rate and Rhythm: Normal rate and regular rhythm.     Pulses: Decreased pulses.     Heart sounds: Normal heart sounds. No murmur  heard. Pulmonary:     Effort: Pulmonary effort is normal.     Breath sounds: Normal breath sounds. No wheezing or rales.  Abdominal:     Tenderness: There is no abdominal tenderness.  Musculoskeletal:     Right lower leg: No edema.     Left lower leg: No edema.         No diagnosis found.    Assessment & Recommendations:   61 year old African-American female with hypertension, hyperlipidemia, type 2 diabetes mellitus, tobacco dependence, CAD, HFrEF-now recovered EF, GERD   CAD: NSTEMI 11/2020. Culprit: thrombotic 95% stenosis Prox Lcx into large OM1 (11/2020)  Non-culprit: Mid Lcx chronic occlusion with R-to-L grade 3 collaterals Now s/p CTO PCI to mid Lcx (06/2021) Moderate LM and mid RCA disease Recommend DAPT with Aspirin and Brilinta till 12/2021 Angina symptoms are significantly improved. Continue amlodipine 5 mg daily, coreg 12.5 mg bid. Continue Crestor and Praluent. LDL down to 15. (06/2021) Needs aggressive diabetes management, A1C 11% (06/2021)  HFrEF: Clinically euvolumic.  LVEF recovered. Entresto discontinued in the past due to AKI Given recovered EF and fatigue due to metoprolol, changing metoprolol to coreg 12.5 mg bid. Continue Bidil 20-37.5 mg tid. Encourage hydration. Continue Corlanor 5 mg bid. Patient has now completed EMPACT-MI the trial. SGLT2i can be added.  Hypertension: Uncontrolled Added losartan 25 mg daily. Check BMP in 1 week.  Mixed hyperlipidemia: LDL down to 15 on Praluent.    Type 2 DM: Uncontrolled. Defer management to primary team.  This remains extremely critical to management of her coronary artery disease.  Strongly recommend close follow-up with PCP. Patient has now completed EMPACT-MI the trial. SGLT2i can be added.   F/u in 3 months   Nigel Mormon, MD Pager: [phone] Office: [phone]

## 2022-02-24 ENCOUNTER — Other Ambulatory Visit (HOSPITAL_COMMUNITY): Payer: Self-pay | Admitting: Nurse Practitioner

## 2022-02-24 DIAGNOSIS — Z1231 Encounter for screening mammogram for malignant neoplasm of breast: Secondary | ICD-10-CM

## 2022-03-05 ENCOUNTER — Ambulatory Visit (HOSPITAL_COMMUNITY)
Admission: RE | Admit: 2022-03-05 | Discharge: 2022-03-05 | Disposition: A | Discharge status: Home / Self Care | Payer: BC Managed Care – PPO | Source: Ambulatory Visit | Attending: Nurse Practitioner | Admitting: Nurse Practitioner

## 2022-03-05 DIAGNOSIS — Z1231 Encounter for screening mammogram for malignant neoplasm of breast: Secondary | ICD-10-CM | POA: Diagnosis present

## 2022-04-01 ENCOUNTER — Ambulatory Visit: Payer: 59 | Admitting: Cardiology

## 2022-04-02 ENCOUNTER — Ambulatory Visit: Payer: 59 | Admitting: Cardiology

## 2022-04-04 NOTE — Progress Notes (Signed)
Error

## 2022-04-09 ENCOUNTER — Encounter: Payer: Self-pay | Admitting: Cardiology

## 2022-04-09 ENCOUNTER — Ambulatory Visit: Payer: 59 | Admitting: Cardiology

## 2022-04-09 VITALS — BP 149/70 | HR 87 | Resp 16 | Ht 71.0 in | Wt 194.8 lb

## 2022-04-09 DIAGNOSIS — I5020 Unspecified systolic (congestive) heart failure: Secondary | ICD-10-CM

## 2022-04-09 DIAGNOSIS — I25118 Atherosclerotic heart disease of native coronary artery with other forms of angina pectoris: Secondary | ICD-10-CM

## 2022-04-09 DIAGNOSIS — I10 Essential (primary) hypertension: Secondary | ICD-10-CM

## 2022-04-09 DIAGNOSIS — E1165 Type 2 diabetes mellitus with hyperglycemia: Secondary | ICD-10-CM

## 2022-04-09 NOTE — Progress Notes (Signed)
Patient referred by [name], NP for coronary artery disease  Subjective:   Monica Bell, female    DOB: [birth year], 61 y.o.   MRN: [id number]  Chief Complaint  Patient presents with   Coronary Artery Disease   Follow-up    3 months    HPI  61 year old African-American female with hypertension, hyperlipidemia, type 2 diabetes mellitus, tobacco dependence, CAD, HFrEF-now recovered EF, GERD  Patient underwent successful mid Lcx CTO PCI (06/2021). Angina is significantly improved, only occurs occasionally now.   Patient has no complaints of chest pain or shortness of breath today.  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  Blood pressure is elevated today, patient reports low numbers previously.  While I remain skeptical of this, patient wants to hold off making any changes today.  She is working diligently with her primary care regarding diabetes management.  Current Outpatient Medications:    amLODipine (NORVASC) 5 MG tablet, TAKE 1 TABLET BY MOUTH ONCE DAILY. (Patient taking differently: Take 5 mg by mouth daily.), Disp: 30 tablet, Rfl: 0   Calcium Carbonate-Vitamin D (CALTRATE 600+D PO), Take 1 tablet by mouth daily., Disp: , Rfl:    cetirizine (ZYRTEC) 10 MG tablet, Take 10 mg by mouth daily., Disp: , Rfl:    cholecalciferol (VITAMIN D3) 25 MCG (1000 UNIT) tablet, Take 1,000 Units by mouth daily., Disp: , Rfl:    esomeprazole (NEXIUM) 20 MG capsule, TAKE 1 CAPSULE BY MOUTH ONCE DAILY AT 12 NOON., Disp: 30 capsule, Rfl: 0   glipiZIDE (GLUCOTROL) 10 MG tablet, Take 10 mg by mouth daily before breakfast., Disp: , Rfl:    GNP ASPIRIN LOW DOSE 81 MG EC tablet, TAKE 1 TABLET BY MOUTH ONCE DAILY. SWALLOW WHOLE. (Patient taking differently: Take 81 mg by mouth daily.), Disp: 90 tablet, Rfl: 1   insulin aspart (NOVOLOG) 100 UNIT/ML injection, Inject 10 Units into the skin 3 (three) times daily before meals., Disp: , Rfl:     levothyroxine (SYNTHROID) 137 MCG tablet, Take 137 mcg by mouth daily. Patient stated that dose was changed but she does not know what the change is., Disp: , Rfl:    losartan (COZAAR) 25 MG tablet, Take 1 tablet (25 mg total) by mouth daily., Disp: 30 tablet, Rfl: 3   magnesium oxide (MAG-OX) 400 MG tablet, Take 400 mg by mouth 3 (three) times a week., Disp: , Rfl:    nitroGLYCERIN (NITROSTAT) 0.4 MG SL tablet, Place 1 tablet (0.4 mg total) under the tongue every 5 (five) minutes as needed for chest pain., Disp: 30 tablet, Rfl: 3   rosuvastatin (CRESTOR) 40 MG tablet, Take 1 tablet (40 mg total) by mouth daily., Disp: 30 tablet, Rfl: 0   TRESIBA FLEXTOUCH 200 UNIT/ML FlexTouch Pen, Inject 50 Units into the skin daily., Disp: , Rfl:    vitamin B-12 (CYANOCOBALAMIN) 1000 MCG tablet, Take 1,000 mcg by mouth daily., Disp: , Rfl:   Cardiovascular and other pertinent studies:  EKG 07/30/2021: Sinus rhythm 67 bpm Low voltage, otherwise normal EKG  Coronary intervention 07/15/2021: LM: Mod 40% disease with MLA 7-9 mm2 LAD: Minimal diffuse disease Lcx: Prox 40-50% disease        Mid Lcx CTO        Patent OM1 stent with no restenosis RCA: Mid 40% disease          Grade 2 collaterals to occluded Lcx   LVEDP normal   (  IVUS) guided successful percutaneous coronary intervention mid Lcx (CTO intervention)     PTCA and stent placement 2.25 X 26 mm Onyx Frontier drug-eluting stent     Proximal optimization with 2.75X15 mm [place] balloon at 18 atm     Kissing balloon post stenting angioplasty Lcx/OM1 w/2.75X15 mm and 3.0X8 mm [place] balloons at 12    0% residual stenosis Excellent stent apposition and expansion and ostial mid Lcx coverage                          Echocardiogram 03/18/2021:  Normal LV systolic function with visual EF 60-65%. Left ventricle cavity  is normal in size. Moderate left ventricular hypertrophy. Normal global  wall motion. Indeterminate diastolic filling pattern,  indeterminate LAP.  No significant valvular disease.  Small pericardial effusion. There is no hemodynamic significance.  Compared to study 12/06/2020: LVEF improved from 40-45% to 60-65%  otherwise no significant change.   Coronary intervention 12/06/2020: LM: Normal LAD: No significant disease Lcx: Prox thrombotic 95% stenosis, 60% ostial OM1 stenosis        Mid Lcx occlusion, likely chronic RCA: Mid 60% disease. Right-to-left collaterals to occluded Lcx   Successful percutaneous coronary intervention prox Lcx-OM1        PTCA and stent placement 3.0 X 30 mm Onyx Frontier drug-eluting  stent, post dilatation with 3.25X8 and 3.5X15 mm [place] balloons up to 20 atm   Distal embolization into small branches of OM1. Aggrastat bolus and infusion started.   Recent labs: 06/27/2021: Glucose 318, BUN/Cr 9/1.26. EGFR 49. Na/K 138/4.5. Rest of the CMP normal H/H 10.6/34.9. MCV 85. Platelets 350 HbA1C 11.5% Chol 75, TG 82, HDL 344, LDL 15 TSH 0.03 very low Free T4 1.6 normal   Review of Systems  Constitutional: Negative for malaise/fatigue.  Cardiovascular:  Negative for chest pain, dyspnea on exertion, leg swelling, palpitations and syncope.        Vitals:   04/09/22 1522 04/09/22 1526  BP: (!) 164/73 (!) 149/70  Pulse: 80 87  Resp: 16   SpO2: 99%      Body mass index is 27.17 kg/m. Filed Weights   04/09/22 1522  Weight: 194 lb 12.8 oz (88.4 kg)     Objective:   Physical Exam Vitals and nursing note reviewed.  Constitutional:      General: She is not in acute distress. Neck:     Vascular: No JVD.  Cardiovascular:     Rate and Rhythm: Normal rate and regular rhythm.     Pulses: Decreased pulses.     Heart sounds: Normal heart sounds. No murmur heard. Pulmonary:     Effort: Pulmonary effort is normal.     Breath sounds: Normal breath sounds. No wheezing or rales.  Abdominal:     Tenderness: There is no abdominal tenderness.  Musculoskeletal:     Right lower leg: No  edema.     Left lower leg: No edema.         No diagnosis found.    Assessment & Recommendations:   61 year old African-American female with hypertension, hyperlipidemia, type 2 diabetes mellitus, tobacco dependence, CAD, HFrEF-now recovered EF, GERD   CAD: NSTEMI 11/2020. Culprit: thrombotic 95% stenosis Prox Lcx into large OM1 (11/2020) Non-culprit: Mid Lcx chronic occlusion with R-to-L grade 3 collaterals Now s/p CTO PCI to mid Lcx (06/2021) Moderate LM and mid RCA disease Recommend DAPT with Aspirin and Brilinta till 12/2021 Angina symptoms are significantly improved. Continue amlodipine 5  mg daily, coreg 12.5 mg bid. Continue Crestor and Praluent. LDL down to 15. (06/2021) Needs aggressive diabetes management, A1C 11% (06/2021)  HFrEF: Clinically euvolumic.  LVEF recovered. Entresto discontinued in the past due to AKI Given recovered EF and fatigue due to metoprolol, changing metoprolol to coreg 12.5 mg bid. Continue Bidil 20-37.5 mg tid. Encourage hydration. Continue Corlanor 5 mg bid. Patient has now completed EMPACT-MI the trial. SGLT2i can be added.  Hypertension: Uncontrolled Blood pressure is elevated today, patient reports low numbers previously.  While I remain skeptical of this, patient wants to hold off making any changes today.  If SBP remains >140 mmHg consistently, recommend increasing amlodipine to 10 mg daily. Patient has follow-up with PCP next month where this can be further addressed.  Mixed hyperlipidemia: LDL down to 15 on Praluent.    Type 2 DM: Uncontrolled. Defer management to primary team.  This remains extremely critical to management of her coronary artery disease.  Strongly recommend close follow-up with PCP. Patient has now completed EMPACT-MI the trial. SGLT2i can be added.   F/u in 3 months   Nigel Mormon, MD Pager: [phone] Office: [phone]

## 2022-05-30 ENCOUNTER — Emergency Department (HOSPITAL_COMMUNITY)
Admission: EM | Admit: 2022-05-30 | Discharge: 2022-05-30 | Disposition: A | Discharge status: Home / Self Care | Payer: BC Managed Care – PPO | Attending: Emergency Medicine | Admitting: Emergency Medicine

## 2022-05-30 ENCOUNTER — Emergency Department (HOSPITAL_COMMUNITY): Payer: BC Managed Care – PPO

## 2022-05-30 ENCOUNTER — Encounter (HOSPITAL_COMMUNITY): Payer: Self-pay | Admitting: *Deleted

## 2022-05-30 ENCOUNTER — Other Ambulatory Visit: Payer: Self-pay

## 2022-05-30 DIAGNOSIS — Z1152 Encounter for screening for COVID-19: Secondary | ICD-10-CM | POA: Insufficient documentation

## 2022-05-30 DIAGNOSIS — J101 Influenza due to other identified influenza virus with other respiratory manifestations: Secondary | ICD-10-CM | POA: Insufficient documentation

## 2022-05-30 DIAGNOSIS — R059 Cough, unspecified: Secondary | ICD-10-CM | POA: Diagnosis present

## 2022-05-30 LAB — RESP PANEL BY RT-PCR (RSV, FLU A&B, COVID)  RVPGX2
Influenza A by PCR: POSITIVE — AB
Influenza B by PCR: NEGATIVE
Resp Syncytial Virus by PCR: NEGATIVE
SARS Coronavirus 2 by RT PCR: NEGATIVE

## 2022-05-30 MED ORDER — ACETAMINOPHEN 325 MG PO TABS
650.0000 mg | ORAL_TABLET | Freq: Once | ORAL | Status: AC
  Administered 2022-05-30: 650 mg via ORAL
  Filled 2022-05-30: qty 2

## 2022-05-30 MED ORDER — OSELTAMIVIR PHOSPHATE 75 MG PO CAPS: 75.0000 mg | ORAL_CAPSULE | Freq: Two times a day (BID) | ORAL | 0 refills | Status: DC

## 2022-05-30 MED ORDER — BENZONATATE 100 MG PO CAPS: 100.0000 mg | ORAL_CAPSULE | Freq: Three times a day (TID) | ORAL | 0 refills | Status: DC

## 2022-05-30 NOTE — ED Provider Notes (Signed)
[facility] Provider Note   CSN: [id number] Arrival date & time: 05/30/22  1400     History  Chief Complaint  Patient presents with   Cough    Monica Bell is a 62 y.o. female who presents to the ED with concerns for cough onset yesterday.  Has associated generalized bodyaches, fever, and rhinorrhea.  Denies sick contacts.  Denies chest pain, shortness of breath, sore throat, trouble swallowing, painful swallowing, nasal congestion.  The history is provided by the patient. No language interpreter was used.       Home Medications Prior to Admission medications   Medication Sig Start Date End Date Taking? Authorizing Provider  amLODipine (NORVASC) 5 MG tablet TAKE 1 TABLET BY MOUTH ONCE DAILY. Patient taking differently: Take 5 mg by mouth daily. 07/25/21  Yes Patwardhan, Manish J, MD  benzonatate (TESSALON) 100 MG capsule Take 1 capsule (100 mg total) by mouth every 8 (eight) hours. 05/30/22  Yes Sybil Shrader A, PA-C  Calcium Carbonate-Vitamin D (CALTRATE 600+D PO) Take 1 tablet by mouth daily.   Yes [provider]  cetirizine (ZYRTEC) 10 MG tablet Take 10 mg by mouth daily.   Yes [provider]  fluconazole (DIFLUCAN) 200 MG tablet Take 200 mg by mouth once. 05/14/22  Yes [provider]  glipiZIDE (GLUCOTROL) 10 MG tablet Take 10 mg by mouth daily before breakfast. 02/24/22  Yes [provider]  GNP ASPIRIN LOW DOSE 81 MG EC tablet TAKE 1 TABLET BY MOUTH ONCE DAILY. SWALLOW WHOLE. Patient taking differently: Take 81 mg by mouth daily. 03/14/21  Yes Patwardhan, Manish J, MD  insulin aspart (NOVOLOG) 100 UNIT/ML injection Inject 15 Units into the skin 3 (three) times daily before meals.   Yes [provider]  levothyroxine (SYNTHROID) 125 MCG tablet Take 125 mcg by mouth every morning. 05/14/22  Yes [provider]  losartan (COZAAR) 25 MG tablet Take 1 tablet (25 mg total) by  mouth daily. 01/02/22  Yes Patwardhan, Manish J, MD  magnesium oxide (MAG-OX) 400 MG tablet Take 400 mg by mouth 3 (three) times a week.   Yes [provider]  nitroGLYCERIN (NITROSTAT) 0.4 MG SL tablet Place 1 tablet (0.4 mg total) under the tongue every 5 (five) minutes as needed for chest pain. 02/24/21  Yes Patwardhan, Manish J, MD  omeprazole (PRILOSEC) 20 MG capsule Take 20 mg by mouth daily. 04/24/22  Yes [provider]  oseltamivir (TAMIFLU) 75 MG capsule Take 1 capsule (75 mg total) by mouth every 12 (twelve) hours. 05/30/22  Yes Tyann Niehaus A, PA-C  rosuvastatin (CRESTOR) 40 MG tablet Take 1 tablet (40 mg total) by mouth daily. 12/08/20  Yes Arrien, Jimmy Picket, MD  TRESIBA FLEXTOUCH 200 UNIT/ML FlexTouch Pen Inject 55 Units into the skin daily. 10/18/20  Yes [provider]  vitamin B-12 (CYANOCOBALAMIN) 1000 MCG tablet Take 1,000 mcg by mouth daily.   Yes [provider]      Allergies    Patient has no known allergies.    Review of Systems   Review of Systems  Respiratory:  Positive for cough.   All other systems reviewed and are negative.   Physical Exam Updated Vital Signs BP (!) 169/65 (BP Location: Right Arm)   Pulse 94   Temp (!) 102 F (38.9 C) (Oral)   Resp 20   Ht '5\' 11"'$  (1.803 m)   Wt 83.9 kg   SpO2 97%   BMI  25.80 kg/m  Physical Exam Vitals and nursing note reviewed.  Constitutional:      General: She is not in acute distress.    Appearance: Normal appearance.  Eyes:     General: No scleral icterus.    Extraocular Movements: Extraocular movements intact.  Cardiovascular:     Rate and Rhythm: Normal rate and regular rhythm.     Pulses: Normal pulses.     Heart sounds: Normal heart sounds.  Pulmonary:     Effort: Pulmonary effort is normal. No respiratory distress.     Breath sounds: Normal breath sounds.  Abdominal:     Palpations: Abdomen is soft. There is no mass.     Tenderness: There is no abdominal  tenderness.  Musculoskeletal:        General: Normal range of motion.     Cervical back: Neck supple.  Skin:    General: Skin is warm and dry.     Findings: No rash.  Neurological:     Mental Status: She is alert.     Sensory: Sensation is intact.     Motor: Motor function is intact.  Psychiatric:        Behavior: Behavior normal.     ED Results / Procedures / Treatments   Labs (all labs ordered are listed, but only abnormal results are displayed) Labs Reviewed  RESP PANEL BY RT-PCR (RSV, FLU A&B, COVID)  RVPGX2 - Abnormal; Notable for the following components:      Result Value   Influenza A by PCR POSITIVE (*)    All other components within normal limits    EKG None  Radiology DG Chest 2 View  Result Date: 05/30/2022 CLINICAL DATA:  Cough, shortness of breath EXAM: CHEST - 2 VIEW COMPARISON:  12/05/2020 FINDINGS: Atherosclerotic calcification of the aortic arch. Coronary stent noted. Heart size within normal limits. No edema. No blunting of the costophrenic angles. The lungs appear clear. Right distal clavicular resection and right-sided rotator cuff anchors noted. IMPRESSION: 1. No active cardiopulmonary disease is radiographically apparent. 2.  Aortic Atherosclerosis (ICD10-I70.0). Electronically Signed   By: Van Clines M.D.   On: 05/30/2022 15:00    Procedures Procedures    Medications Ordered in ED Medications  acetaminophen (TYLENOL) tablet 650 mg (650 mg Oral Given 05/30/22 1412)    ED Course/ Medical Decision Making/ A&P Clinical Course as of 05/30/22 1627  Sat May 30, 2022  1539 Influenza A By PCR(!): POSITIVE [SB]  1552 Re-evaluated and discussed with patient swab and x-ray findings.  Discussed with patient discharge treatment plan.  Answered all available questions.  Patient appears safe for discharge at this time. [SB]    Clinical Course User Index [SB] Troy Hartzog A, PA-C                             Medical Decision Making Amount and/or  Complexity of Data Reviewed Labs:  Decision-making details documented in ED Course. Radiology: ordered.  Risk OTC drugs. Prescription drug management.   Pt presents with concerns for cough onset yesterday.  No sick contacts. Vital signs, patient initially febrile at 102.  Temperature improved to 99.1 at time of discharge. On exam, pt with no acute cardiovascular, respiratory, abdominal exam findings. Differential diagnosis includes COVID, flu, RSV, viral URI with cough, PNA.    Labs:  I ordered, and personally interpreted labs.  The pertinent results include:   Negative COVID, RSV swabs. Positive influenza A swab  Imaging: I ordered imaging studies including Chest x-ray I independently visualized and interpreted imaging which showed: No acute findings I agree with the radiologist interpretation  Medications:  I ordered medication including Tylenol for symptom management Reevaluation of the patient after these medicines and interventions, I reevaluated the patient and found that they have improved I have reviewed the patients home medicines and have made adjustments as needed   Disposition: Presentation suspicious for Flu A. Doubt COVID, RSV, or PNA at this time. Discussed with patient regarding use of Tamiflu and discussed thoroughly on its risks and benefits. Follow discussion, patient opted for treatment with Tamiflu at this time.  After consideration of the diagnostic results and the patients response to treatment, I feel that the patient would benefit from Discharge home. Rx for Tamiflu sent. Rx for tessalon perles sent. Supportive care measures and strict return precautions discussed with patient at bedside. Pt acknowledges and verbalizes understanding. Pt appears safe for discharge. Follow up as indicated in discharge paperwork.    This chart was dictated using voice recognition software, Dragon. Despite the best efforts of this provider to proofread and correct errors, errors  may still occur which can change documentation meaning.   Final Clinical Impression(s) / ED Diagnoses Final diagnoses:  Influenza A    Rx / DC Orders ED Discharge Orders          Ordered    benzonatate (TESSALON) 100 MG capsule  Every 8 hours        05/30/22 1601    oseltamivir (TAMIFLU) 75 MG capsule  Every 12 hours        05/30/22 1601              Mykia Holton A, PA-C 05/30/22 1627    Hayden Rasmussen, MD 05/30/22 1836

## 2022-05-30 NOTE — Discharge Instructions (Signed)
It was a pleasure taking care of you today!   Your COVID and RSV swabs were negative today. Your chest xray didn't show concerning findings for pneumonia today. Your flu swab was positive for Flu A.  You will be sent a prescription for Tamiflu as well as Tessalon Perles.  You may continue with over the counter cough and cold medications. Ensure to maintain fluid intake. Follow up with your primary care provider regarding todays ED visit. Ensure that you are wearing your mask and practicing good hand hygiene. Return to the ED if you are experiencing increasing/worsening symptoms.

## 2022-05-30 NOTE — ED Triage Notes (Signed)
Pt with body aches and cough since yesterday.  Fever noted in triage of 102, denies taking any tylenol or motrin today.

## 2022-06-17 ENCOUNTER — Other Ambulatory Visit (HOSPITAL_COMMUNITY): Payer: Self-pay

## 2022-06-25 ENCOUNTER — Encounter: Payer: Self-pay | Admitting: Radiology

## 2022-07-09 ENCOUNTER — Ambulatory Visit: Payer: BC Managed Care – PPO | Admitting: Cardiology

## 2022-07-23 ENCOUNTER — Ambulatory Visit: Payer: BC Managed Care – PPO | Admitting: Cardiology

## 2022-07-23 ENCOUNTER — Encounter: Payer: Self-pay | Admitting: Cardiology

## 2022-07-23 VITALS — BP 184/76 | HR 67 | Ht 71.0 in | Wt 199.0 lb

## 2022-07-23 DIAGNOSIS — I10 Essential (primary) hypertension: Secondary | ICD-10-CM

## 2022-07-23 DIAGNOSIS — F17210 Nicotine dependence, cigarettes, uncomplicated: Secondary | ICD-10-CM | POA: Insufficient documentation

## 2022-07-23 DIAGNOSIS — E1165 Type 2 diabetes mellitus with hyperglycemia: Secondary | ICD-10-CM

## 2022-07-23 DIAGNOSIS — I25118 Atherosclerotic heart disease of native coronary artery with other forms of angina pectoris: Secondary | ICD-10-CM

## 2022-07-23 MED ORDER — NITROGLYCERIN 0.4 MG SL SUBL: 0.4000 mg | SUBLINGUAL_TABLET | SUBLINGUAL | 3 refills | Status: AC | PRN

## 2022-07-23 MED ORDER — NICOTINE 7 MG/24HR TD PT24: 7.0000 mg | MEDICATED_PATCH | Freq: Every day | TRANSDERMAL | 2 refills | Status: DC

## 2022-07-23 MED ORDER — AMLODIPINE BESYLATE 10 MG PO TABS: 10.0000 mg | ORAL_TABLET | Freq: Every day | ORAL | 3 refills | Status: DC

## 2022-07-23 NOTE — Progress Notes (Signed)
Patient referred by [name], NP for coronary artery disease  Subjective:   Monica Bell, female    DOB: 21-Dec-1960, 62 y.o.   MRN: [id number]  Chief Complaint  Patient presents with   Coronary Artery Disease    HPI  62 year old African-American female with hypertension, hyperlipidemia, type 2 diabetes mellitus, tobacco dependence, CAD, HFrEF-now recovered EF, GERD  Patient underwent successful mid Lcx CTO PCI (06/2021). Angina is significantly improved, only occurs occasionally now.   In the past year or so, patient's diabetes has been uncontrolled.  Unfortunately, she is also started smoking back <half pack/day.  Blood pressure is elevated today.  She does report retrosternal chest pain that occurs twice a week or so, with exertion, improved with rest.  She has not had to use any nitroglycerin.  Current Outpatient Medications:    amLODipine (NORVASC) 5 MG tablet, TAKE 1 TABLET BY MOUTH ONCE DAILY. (Patient taking differently: Take 5 mg by mouth daily.), Disp: 30 tablet, Rfl: 0   Calcium Carbonate-Vitamin D (CALTRATE 600+D PO), Take 1 tablet by mouth daily., Disp: , Rfl:    cetirizine (ZYRTEC) 10 MG tablet, Take 10 mg by mouth daily., Disp: , Rfl:    fluconazole (DIFLUCAN) 200 MG tablet, Take 200 mg by mouth once., Disp: , Rfl:    glipiZIDE (GLUCOTROL) 10 MG tablet, Take 10 mg by mouth daily before breakfast., Disp: , Rfl:    GNP ASPIRIN LOW DOSE 81 MG EC tablet, TAKE 1 TABLET BY MOUTH ONCE DAILY. SWALLOW WHOLE. (Patient taking differently: Take 81 mg by mouth daily.), Disp: 90 tablet, Rfl: 1   insulin aspart (NOVOLOG) 100 UNIT/ML injection, Inject 15 Units into the skin 3 (three) times daily before meals., Disp: , Rfl:    levothyroxine (SYNTHROID) 125 MCG tablet, Take 125 mcg by mouth every morning., Disp: , Rfl:    losartan (COZAAR) 25 MG tablet, Take 1 tablet (25 mg total) by mouth daily., Disp: 30 tablet, Rfl: 3   magnesium oxide (MAG-OX) 400 MG tablet, Take 400  mg by mouth daily., Disp: , Rfl:    omeprazole (PRILOSEC) 20 MG capsule, Take 20 mg by mouth daily., Disp: , Rfl:    rosuvastatin (CRESTOR) 40 MG tablet, Take 1 tablet (40 mg total) by mouth daily., Disp: 30 tablet, Rfl: 0   TRESIBA FLEXTOUCH 200 UNIT/ML FlexTouch Pen, Inject 55 Units into the skin daily., Disp: , Rfl:    vitamin B-12 (CYANOCOBALAMIN) 1000 MCG tablet, Take 1,000 mcg by mouth daily., Disp: , Rfl:    nitroGLYCERIN (NITROSTAT) 0.4 MG SL tablet, Place 1 tablet (0.4 mg total) under the tongue every 5 (five) minutes as needed for chest pain. (Patient not taking: Reported on 07/23/2022), Disp: 30 tablet, Rfl: 3  Cardiovascular and other pertinent studies:  EKG 07/23/2022: Sinus rhythm 68 bpm Nonspecific T-abnormality  Coronary intervention 07/15/2021: LM: Mod 40% disease with MLA 7-9 mm2 LAD: Minimal diffuse disease Lcx: Prox 40-50% disease        Mid Lcx CTO        Patent OM1 stent with no restenosis RCA: Mid 40% disease          Grade 2 collaterals to occluded Lcx   LVEDP normal   (IVUS) guided successful percutaneous coronary intervention mid Lcx (CTO intervention)     PTCA and stent placement 2.25 X 26 mm Onyx Frontier drug-eluting stent     Proximal optimization with 2.75X15 mm [place] balloon at 18 atm     Kissing balloon  post stenting angioplasty Lcx/OM1 w/2.75X15 mm and 3.0X8 mm [place] balloons at 12    0% residual stenosis Excellent stent apposition and expansion and ostial mid Lcx coverage                          Echocardiogram 03/18/2021:  Normal LV systolic function with visual EF 60-65%. Left ventricle cavity  is normal in size. Moderate left ventricular hypertrophy. Normal global  wall motion. Indeterminate diastolic filling pattern, indeterminate LAP.  No significant valvular disease.  Small pericardial effusion. There is no hemodynamic significance.  Compared to study 12/06/2020: LVEF improved from 40-45% to 60-65%  otherwise no significant change.    Coronary intervention 12/06/2020: LM: Normal LAD: No significant disease Lcx: Prox thrombotic 95% stenosis, 60% ostial OM1 stenosis        Mid Lcx occlusion, likely chronic RCA: Mid 60% disease. Right-to-left collaterals to occluded Lcx   Successful percutaneous coronary intervention prox Lcx-OM1        PTCA and stent placement 3.0 X 30 mm Onyx Frontier drug-eluting  stent, post dilatation with 3.25X8 and 3.5X15 mm [place] balloons up to 20 atm   Distal embolization into small branches of OM1. Aggrastat bolus and infusion started.   Recent labs: 06/27/2021: Glucose 318, BUN/Cr 9/1.26. EGFR 49. Na/K 138/4.5. Rest of the CMP normal H/H 10.6/34.9. MCV 85. Platelets 350 HbA1C 11.5% Chol 75, TG 82, HDL 44, LDL 15 TSH 0.03 very low Free T4 1.6 normal   Review of Systems  Constitutional: Negative for malaise/fatigue.  Cardiovascular:  Positive for chest pain. Negative for dyspnea on exertion, leg swelling, palpitations and syncope.        Vitals:   07/23/22 1439  BP: (!) 184/76  Pulse: 67  SpO2: 100%     Body mass index is 27.75 kg/m. Filed Weights   07/23/22 1439  Weight: 199 lb (90.3 kg)     Objective:   Physical Exam Vitals and nursing note reviewed.  Constitutional:      General: She is not in acute distress. Neck:     Vascular: No JVD.  Cardiovascular:     Rate and Rhythm: Normal rate and regular rhythm.     Pulses: Decreased pulses.     Heart sounds: Murmur heard.     Harsh midsystolic murmur is present with a grade of 2/6 at the upper right sternal border radiating to the neck.  Pulmonary:     Effort: Pulmonary effort is normal.     Breath sounds: Normal breath sounds. No wheezing or rales.  Abdominal:     Tenderness: There is no abdominal tenderness.  Musculoskeletal:     Right lower leg: No edema.     Left lower leg: No edema.           ICD-10-CM   1. Coronary artery disease of native artery of native heart with stable angina pectoris (HCC)   I25.118 EKG 12-Lead    amLODipine (NORVASC) 10 MG tablet    PCV ECHOCARDIOGRAM COMPLETE    PCV MYOCARDIAL PERFUSION WO LEXISCAN    nitroGLYCERIN (NITROSTAT) 0.4 MG SL tablet    2. Primary hypertension  I10     3. Uncontrolled type 2 diabetes mellitus with hyperglycemia (HCC)  E11.65     4. Cigarette nicotine dependence without complication  123XX123         Assessment & Recommendations:   62 year old African-American female with hypertension, hyperlipidemia, type 2 diabetes mellitus, tobacco dependence, CAD, HFrEF-now recovered  EF, GERD   CAD: NSTEMI 11/2020. Culprit: thrombotic 95% stenosis Prox Lcx into large OM1 (11/2020) Non-culprit: Mid Lcx chronic occlusion with R-to-L grade 3 collaterals Now s/p CTO PCI to mid Lcx (06/2021) Moderate LM and mid RCA disease Completed DAPT. Continue aspirin 81 mg daily. Recent increase in anginal symptoms. Will obtain exercise nuclear stress test and echocardiogram. Increase amlodipine to 10 mg daily.  Hopefully will also help with hypertension. Continue Coreg 12.5 mg bid. Continue Crestor and Praluent. LDL down to 15. (06/2021) Needs aggressive diabetes management.  A1c remains >10%. I have urged her to discuss with her PCP regarding endocrinology referral. Uncontrolled diabetes remains her biggest risk factor for recurrence of any ASCVD event.    HFrEF: Clinically euvolumic.  LVEF recovered. Entresto discontinued in the past due to AKI.  Continue losartan. Continue Coreg 12.5 mg twice daily, Bidil 20-37.5 mg tid.  Patient has now completed EMPACT-MI the trial. SGLT2i can be added.  Hypertension: As above.  Mixed hyperlipidemia: LDL down to 15 on Praluent.    Type 2 DM:  As above.  Nicotine dependence: Tobacco cessation counseling:  - Currently smoking <1/2 packs/day   - Patient was informed of the dangers of tobacco abuse including stroke, cancer, and MI, as well as benefits of tobacco cessation. - Patient is willing to quit  at this time. - Approximately 5 mins were spent counseling patient cessation techniques. We discussed various methods to help quit smoking, including deciding on a date to quit, joining a support group, pharmacological agents. Patient would like to use nicotine patches. - I will reassess her progress at the next follow-up visit   F/u in 6 weeks     Nigel Mormon, MD Pager: [phone] Office: [phone]

## 2022-08-24 ENCOUNTER — Ambulatory Visit: Payer: BC Managed Care – PPO

## 2022-08-24 DIAGNOSIS — I25118 Atherosclerotic heart disease of native coronary artery with other forms of angina pectoris: Secondary | ICD-10-CM

## 2022-08-31 NOTE — Progress Notes (Deleted)
Patient referred by [name], NP for coronary artery disease  Subjective:   Monica Bell, female    DOB: 07-Feb-1961, 62 y.o.   MRN: [id number]  No chief complaint on file.   HPI  62 year old African-American female with hypertension, hyperlipidemia, type 2 diabetes mellitus, tobacco dependence, CAD, HFrEF-now recovered EF, GERD  Patient underwent successful mid Lcx CTO PCI (06/2021). Angina is significantly improved, only occurs occasionally now.   ***In the past year or so, patient's diabetes has been uncontrolled.  Unfortunately, she is also started smoking back <half pack/day.  Blood pressure is elevated today.  She does report retrosternal chest pain that occurs twice a week or so, with exertion, improved with rest.  She has not had to use any nitroglycerin.   Current Outpatient Medications:    amLODipine (NORVASC) 10 MG tablet, Take 1 tablet (10 mg total) by mouth daily., Disp: 90 tablet, Rfl: 3   Calcium Carbonate-Vitamin D (CALTRATE 600+D PO), Take 1 tablet by mouth daily., Disp: , Rfl:    cetirizine (ZYRTEC) 10 MG tablet, Take 10 mg by mouth daily., Disp: , Rfl:    fluconazole (DIFLUCAN) 200 MG tablet, Take 200 mg by mouth once., Disp: , Rfl:    glipiZIDE (GLUCOTROL) 10 MG tablet, Take 10 mg by mouth daily before breakfast., Disp: , Rfl:    GNP ASPIRIN LOW DOSE 81 MG EC tablet, TAKE 1 TABLET BY MOUTH ONCE DAILY. SWALLOW WHOLE. (Patient taking differently: Take 81 mg by mouth daily.), Disp: 90 tablet, Rfl: 1   insulin aspart (NOVOLOG) 100 UNIT/ML injection, Inject 15 Units into the skin 3 (three) times daily before meals., Disp: , Rfl:    levothyroxine (SYNTHROID) 125 MCG tablet, Take 125 mcg by mouth every morning., Disp: , Rfl:    losartan (COZAAR) 25 MG tablet, Take 1 tablet (25 mg total) by mouth daily., Disp: 30 tablet, Rfl: 3   magnesium oxide (MAG-OX) 400 MG tablet, Take 400 mg by mouth daily., Disp: , Rfl:    nicotine (NICODERM CQ - DOSED IN MG/24 HR) 7  mg/24hr patch, Place 1 patch (7 mg total) onto the skin daily., Disp: 28 patch, Rfl: 2   nitroGLYCERIN (NITROSTAT) 0.4 MG SL tablet, Place 1 tablet (0.4 mg total) under the tongue every 5 (five) minutes as needed for chest pain., Disp: 30 tablet, Rfl: 3   omeprazole (PRILOSEC) 20 MG capsule, Take 20 mg by mouth daily., Disp: , Rfl:    rosuvastatin (CRESTOR) 40 MG tablet, Take 1 tablet (40 mg total) by mouth daily., Disp: 30 tablet, Rfl: 0   TRESIBA FLEXTOUCH 200 UNIT/ML FlexTouch Pen, Inject 55 Units into the skin daily., Disp: , Rfl:    vitamin B-12 (CYANOCOBALAMIN) 1000 MCG tablet, Take 1,000 mcg by mouth daily., Disp: , Rfl:   Cardiovascular and other pertinent studies:  EKG 07/23/2022: Sinus rhythm 68 bpm Nonspecific T-abnormality  Echocardiogram 08/24/2022:  Normal LV systolic function with visual EF 55-60%. Left ventricle cavity  is normal in size. Mild concentric hypertrophy of the left ventricle.  Normal global wall motion. Indeterminate diastolic filling pattern,  elevated LAP. Calculated EF 55%.  Trileaflet aortic valve.  Trace aortic regurgitation. Mild aortic valve  leaflet calcification.  Structurally normal tricuspid valve with no regurgitation. No evidence of  pulmonary hypertension.  Pericardium is normal. Trace pericardial effusion. There is no hemodynamic  significance.  No significant change compared to 02/2021.   Exercise Sestamibi stress test 08/24/2022: Exercise nuclear stress test was performed using Bruce  protocol.   1 Day Rest and Stress images. Exercise time 3 minutes 53 seconds on Bruce protocol, achieved 5.7 METS, 90% of APMHR.  Hypertensive response to exercise: Yes (resting 140/80, peak 210/72) Stress ECG positive for ischemia.   Normal myocardial perfusion, without convincing evidence of reversible ischemia or prior infarct. Left ventricular size slightly above normal limits, calculated LVEF 38% (visually appears preserved).  Intermediate risk study -  given the reduced LVEF by gated SPECT, reduced functional capacity for age, hypertensive response to exercise, and EKG changes. No prior studies available for comparison. Clinical correlation required.    Coronary intervention 07/15/2021: LM: Mod 40% disease with MLA 7-9 mm2 LAD: Minimal diffuse disease Lcx: Prox 40-50% disease        Mid Lcx CTO        Patent OM1 stent with no restenosis RCA: Mid 40% disease          Grade 2 collaterals to occluded Lcx  LVEDP normal   (IVUS) guided successful percutaneous coronary intervention mid Lcx (CTO intervention)     PTCA and stent placement 2.25 X 26 mm Onyx Frontier drug-eluting stent     Proximal optimization with 2.75X15 mm [place] balloon at 18 atm     Kissing balloon post stenting angioplasty Lcx/OM1 w/2.75X15 mm and 3.0X8 mm [place] balloons at 12    0% residual stenosis Excellent stent apposition and expansion and ostial mid Lcx coverage                          Echocardiogram 03/18/2021:  Normal LV systolic function with visual EF 60-65%. Left ventricle cavity  is normal in size. Moderate left ventricular hypertrophy. Normal global  wall motion. Indeterminate diastolic filling pattern, indeterminate LAP.  No significant valvular disease.  Small pericardial effusion. There is no hemodynamic significance.  Compared to study 12/06/2020: LVEF improved from 40-45% to 60-65%  otherwise no significant change.   Coronary intervention 12/06/2020: LM: Normal LAD: No significant disease Lcx: Prox thrombotic 95% stenosis, 60% ostial OM1 stenosis        Mid Lcx occlusion, likely chronic RCA: Mid 60% disease. Right-to-left collaterals to occluded Lcx   Successful percutaneous coronary intervention prox Lcx-OM1        PTCA and stent placement 3.0 X 30 mm Onyx Frontier drug-eluting  stent, post dilatation with 3.25X8 and 3.5X15 mm  balloons up to 20 atm   Distal embolization into small branches of OM1. Aggrastat bolus and infusion  started.   Recent labs: 06/27/2021: Glucose 318, BUN/Cr 9/1.26. EGFR 49. Na/K 138/4.5. Rest of the CMP normal H/H 10.6/34.9. MCV 85. Platelets 350 HbA1C 11.5% Chol 75, TG 82, HDL 44, LDL 15 TSH 0.03 very low Free T4 1.6 normal   Review of Systems  Constitutional: Negative for malaise/fatigue.  Cardiovascular:  Positive for chest pain. Negative for dyspnea on exertion, leg swelling, palpitations and syncope.        There were no vitals filed for this visit.    There is no height or weight on file to calculate BMI. There were no vitals filed for this visit.    Objective:   Physical Exam Vitals and nursing note reviewed.  Constitutional:      General: She is not in acute distress. Neck:     Vascular: No JVD.  Cardiovascular:     Rate and Rhythm: Normal rate and regular rhythm.     Pulses: Decreased pulses.     Heart sounds: Murmur heard.  Harsh midsystolic murmur is present with a grade of 2/6 at the upper right sternal border radiating to the neck.  Pulmonary:     Effort: Pulmonary effort is normal.     Breath sounds: Normal breath sounds. No wheezing or rales.  Abdominal:     Tenderness: There is no abdominal tenderness.  Musculoskeletal:     Right lower leg: No edema.     Left lower leg: No edema.         No diagnosis found.     Assessment & Recommendations:   62 year old African-American female with hypertension, hyperlipidemia, type 2 diabetes mellitus, tobacco dependence, CAD, HFrEF-now recovered EF, GERD   CAD: NSTEMI 11/2020. Culprit: thrombotic 95% stenosis Prox Lcx into large OM1 (11/2020) Non-culprit: Mid Lcx chronic occlusion with R-to-L grade 3 collaterals Now s/p CTO PCI to mid Lcx (06/2021) Moderate LM and mid RCA disease Completed DAPT. Continue aspirin 81 mg daily. Recent increase in anginal symptoms. Will obtain exercise nuclear stress test and echocardiogram. Increase amlodipine to 10 mg daily.  Hopefully will also help with  hypertension. Continue Coreg 12.5 mg bid. Continue Crestor and Praluent. LDL down to 15. (06/2021) Needs aggressive diabetes management.  A1c remains >10%. I have urged her to discuss with her PCP regarding endocrinology referral. Uncontrolled diabetes remains her biggest risk factor for recurrence of any ASCVD event.    HFrEF: Clinically euvolumic.  LVEF recovered. Entresto discontinued in the past due to AKI.  Continue losartan. Continue Coreg 12.5 mg twice daily, Bidil 20-37.5 mg tid.  Patient has now completed EMPACT-MI the trial. SGLT2i can be added.  Hypertension: As above.  Mixed hyperlipidemia: LDL down to 15 on Praluent.    Type 2 DM:  As above.  Nicotine dependence: Tobacco cessation counseling:  - Currently smoking <1/2 packs/day   - Patient was informed of the dangers of tobacco abuse including stroke, cancer, and MI, as well as benefits of tobacco cessation. - Patient is willing to quit at this time. - Approximately 5 mins were spent counseling patient cessation techniques. We discussed various methods to help quit smoking, including deciding on a date to quit, joining a support group, pharmacological agents. Patient would like to use nicotine patches. - I will reassess her progress at the next follow-up visit   F/u in ***     Elder Negus, MD Pager: [phone] Office: [phone]

## 2022-09-03 ENCOUNTER — Ambulatory Visit: Payer: BC Managed Care – PPO | Admitting: Podiatry

## 2022-09-09 ENCOUNTER — Ambulatory Visit: Payer: BC Managed Care – PPO | Admitting: Cardiology

## 2022-09-10 ENCOUNTER — Ambulatory Visit: Payer: BC Managed Care – PPO | Admitting: Podiatry

## 2022-09-10 DIAGNOSIS — Q828 Other specified congenital malformations of skin: Secondary | ICD-10-CM | POA: Diagnosis not present

## 2022-09-10 NOTE — Progress Notes (Signed)
Subjective:  Patient ID: Monica Bell, female    DOB: Nov 26, 1960,  MRN: [id number]  Chief Complaint  Patient presents with   Foot Pain    62 y.o. female presents with the above complaint.  Patient presents with right ankle porokeratosis/dry skin.  She states that it is becoming little bit painful.  She went to get it evaluated.  She does not see anything rubbing on it she has not seen and was prior to seeing me for this pain scale 7 out of 10 dull achy in nature.   Review of Systems: Negative except as noted in the HPI. Denies N/V/F/Ch.  Past Medical History:  Diagnosis Date   Abnormal mammogram    Allergic rhinitis due to pollen 08/01/2019   Arthritis 01/06/2019   osteoarthritis of both knees   BMI 28.0-28.9,adult 08/01/2019   Cystocele, unspecified (CODE)    Diabetes mellitus    GERD (gastroesophageal reflux disease)    Heart murmur    Hyperlipemia, mixed 12/14/2018   Hypertension    Hypothyroidism    Stress incontinence, female    Thyroid disease    Type 2 diabetes mellitus, without long-term current use of insulin (HCC) 01/06/2019   Vitamin D deficiency 01/06/2019    Current Outpatient Medications:    amLODipine (NORVASC) 10 MG tablet, Take 1 tablet (10 mg total) by mouth daily., Disp: 90 tablet, Rfl: 3   Calcium Carbonate-Vitamin D (CALTRATE 600+D PO), Take 1 tablet by mouth daily., Disp: , Rfl:    carvedilol (COREG) 6.25 MG tablet, Take 1 tablet (6.25 mg total) by mouth 2 (two) times daily with a meal., Disp: 180 tablet, Rfl: 3   cetirizine (ZYRTEC) 10 MG tablet, Take 10 mg by mouth daily., Disp: , Rfl:    fluconazole (DIFLUCAN) 200 MG tablet, Take 200 mg by mouth once., Disp: , Rfl:    glipiZIDE (GLUCOTROL) 10 MG tablet, Take 10 mg by mouth daily before breakfast., Disp: , Rfl:    GNP ASPIRIN LOW DOSE 81 MG EC tablet, TAKE 1 TABLET BY MOUTH ONCE DAILY. SWALLOW WHOLE. (Patient taking differently: Take 81 mg by mouth daily.), Disp: 90 tablet, Rfl: 1   insulin  aspart (NOVOLOG) 100 UNIT/ML injection, Inject 15 Units into the skin 3 (three) times daily before meals., Disp: , Rfl:    levothyroxine (SYNTHROID) 125 MCG tablet, Take 125 mcg by mouth every morning., Disp: , Rfl:    losartan (COZAAR) 25 MG tablet, Take 1 tablet (25 mg total) by mouth daily., Disp: 30 tablet, Rfl: 3   magnesium oxide (MAG-OX) 400 MG tablet, Take 400 mg by mouth daily., Disp: , Rfl:    nicotine (NICODERM CQ - DOSED IN MG/24 HR) 7 mg/24hr patch, Place 1 patch (7 mg total) onto the skin daily., Disp: 28 patch, Rfl: 2   nitroGLYCERIN (NITROSTAT) 0.4 MG SL tablet, Place 1 tablet (0.4 mg total) under the tongue every 5 (five) minutes as needed for chest pain., Disp: 30 tablet, Rfl: 3   omeprazole (PRILOSEC) 20 MG capsule, Take 20 mg by mouth daily., Disp: , Rfl:    rosuvastatin (CRESTOR) 40 MG tablet, Take 1 tablet (40 mg total) by mouth daily., Disp: 30 tablet, Rfl: 0   TRESIBA FLEXTOUCH 200 UNIT/ML FlexTouch Pen, Inject 55 Units into the skin daily., Disp: , Rfl:    vitamin B-12 (CYANOCOBALAMIN) 1000 MCG tablet, Take 1,000 mcg by mouth daily., Disp: , Rfl:   Social History   Tobacco Use  Smoking Status Some Days   Packs/day:  0.50   Years: 15.00   Additional pack years: 0.00   Total pack years: 7.50   Types: Cigarettes   Last attempt to quit: 11/2020   Years since quitting: 1.8  Smokeless Tobacco Never  Tobacco Comments   Quite: 11/2020 and restarted: 04/2022    No Known Allergies Objective:  There were no vitals filed for this visit. There is no height or weight on file to calculate BMI. Constitutional Well developed. Well nourished.  Vascular Dorsalis pedis pulses palpable bilaterally. Posterior tibial pulses palpable bilaterally. Capillary refill normal to all digits.  No cyanosis or clubbing noted. Pedal hair growth normal.  Neurologic Normal speech. Oriented to person, place, and time. Epicritic sensation to light touch grossly present bilaterally.   Dermatologic Right ankle hyperkeratotic lesion with central nucleated core noted pain on palpation to the lesion.  No pinpoint bleeding or debridement noted.  Orthopedic: Normal joint ROM without pain or crepitus bilaterally. No visible deformities. No bony tenderness.   Radiographs: None Assessment:   1. Porokeratosis    Plan:  Patient was evaluated and treated and all questions answered.  Right ankle porokeratosis -All questions and concerns were discussed with the patient in extensive detail using chisel blade handle the lesion was debrided down to healthy scar tissue no complication noted no pinpoint bleeding noted.  I discussed shoe gear modification in extensive detail.  If any foot and ankle issues very future she will come back and see me.  No follow-ups on file.

## 2022-09-16 ENCOUNTER — Encounter: Payer: Self-pay | Admitting: Cardiology

## 2022-09-16 ENCOUNTER — Ambulatory Visit: Payer: BC Managed Care – PPO | Admitting: Cardiology

## 2022-09-16 VITALS — BP 172/67 | HR 87 | Resp 17 | Ht 71.0 in | Wt 199.0 lb

## 2022-09-16 DIAGNOSIS — E1165 Type 2 diabetes mellitus with hyperglycemia: Secondary | ICD-10-CM

## 2022-09-16 DIAGNOSIS — I10 Essential (primary) hypertension: Secondary | ICD-10-CM

## 2022-09-16 DIAGNOSIS — I25118 Atherosclerotic heart disease of native coronary artery with other forms of angina pectoris: Secondary | ICD-10-CM

## 2022-09-16 MED ORDER — CARVEDILOL 6.25 MG PO TABS: 6.2500 mg | ORAL_TABLET | Freq: Two times a day (BID) | ORAL | 3 refills | Status: DC

## 2022-09-16 NOTE — Progress Notes (Signed)
Patient referred by [name], NP for coronary artery disease  Subjective:   Monica Bell, female    DOB: [birth year], 61 y.o.   MRN: [id number]  Chief Complaint  Patient presents with   Coronary Artery Disease   Follow-up    6 week    HPI  62 year old African-American female with hypertension, hyperlipidemia, type 2 diabetes mellitus, tobacco dependence, CAD, HFrEF-now recovered EF, GERD  Patient underwent successful mid Lcx CTO PCI (06/2021). She denies any recurrent angina symptoms at this time. Reviewed recent test results with the patient, details below. Blood pressure remains elevated.    Current Outpatient Medications:    amLODipine (NORVASC) 10 MG tablet, Take 1 tablet (10 mg total) by mouth daily., Disp: 90 tablet, Rfl: 3   Calcium Carbonate-Vitamin D (CALTRATE 600+D PO), Take 1 tablet by mouth daily., Disp: , Rfl:    cetirizine (ZYRTEC) 10 MG tablet, Take 10 mg by mouth daily., Disp: , Rfl:    fluconazole (DIFLUCAN) 200 MG tablet, Take 200 mg by mouth once., Disp: , Rfl:    glipiZIDE (GLUCOTROL) 10 MG tablet, Take 10 mg by mouth daily before breakfast., Disp: , Rfl:    GNP ASPIRIN LOW DOSE 81 MG EC tablet, TAKE 1 TABLET BY MOUTH ONCE DAILY. SWALLOW WHOLE. (Patient taking differently: Take 81 mg by mouth daily.), Disp: 90 tablet, Rfl: 1   insulin aspart (NOVOLOG) 100 UNIT/ML injection, Inject 15 Units into the skin 3 (three) times daily before meals., Disp: , Rfl:    levothyroxine (SYNTHROID) 125 MCG tablet, Take 125 mcg by mouth every morning., Disp: , Rfl:    losartan (COZAAR) 25 MG tablet, Take 1 tablet (25 mg total) by mouth daily., Disp: 30 tablet, Rfl: 3   magnesium oxide (MAG-OX) 400 MG tablet, Take 400 mg by mouth daily., Disp: , Rfl:    nicotine (NICODERM CQ - DOSED IN MG/24 HR) 7 mg/24hr patch, Place 1 patch (7 mg total) onto the skin daily., Disp: 28 patch, Rfl: 2   nitroGLYCERIN (NITROSTAT) 0.4 MG SL tablet, Place 1 tablet (0.4 mg total) under the  tongue every 5 (five) minutes as needed for chest pain., Disp: 30 tablet, Rfl: 3   omeprazole (PRILOSEC) 20 MG capsule, Take 20 mg by mouth daily., Disp: , Rfl:    rosuvastatin (CRESTOR) 40 MG tablet, Take 1 tablet (40 mg total) by mouth daily., Disp: 30 tablet, Rfl: 0   TRESIBA FLEXTOUCH 200 UNIT/ML FlexTouch Pen, Inject 55 Units into the skin daily., Disp: , Rfl:    vitamin B-12 (CYANOCOBALAMIN) 1000 MCG tablet, Take 1,000 mcg by mouth daily., Disp: , Rfl:   Cardiovascular and other pertinent studies:  EKG 07/23/2022: Sinus rhythm 68 bpm Nonspecific T-abnormality  Exercise Sestamibi stress test 08/24/2022: Exercise nuclear stress test was performed using Bruce protocol.   1 Day Rest and Stress images. Exercise time 3 minutes 53 seconds on Bruce protocol, achieved 5.7 METS, 90% of APMHR.  Hypertensive response to exercise: Yes (resting 140/80, peak 210/72) Stress ECG positive for ischemia.   Normal myocardial perfusion, without convincing evidence of reversible ischemia or prior infarct. Left ventricular size slightly above normal limits, calculated LVEF 38% (visually appears preserved).  Intermediate risk study - given the reduced LVEF by gated SPECT, reduced functional capacity for age, hypertensive response to exercise, and EKG changes. No prior studies available for comparison. Clinical correlation required.    Coronary intervention 07/15/2021: LM: Mod 40% disease with MLA 7-9 mm2 LAD: Minimal diffuse disease Lcx:  Prox 40-50% disease        Mid Lcx CTO        Patent OM1 stent with no restenosis RCA: Mid 40% disease          Grade 2 collaterals to occluded Lcx   LVEDP normal   (IVUS) guided successful percutaneous coronary intervention mid Lcx (CTO intervention)     PTCA and stent placement 2.25 X 26 mm Onyx Frontier drug-eluting stent     Proximal optimization with 2.75X15 mm [place] balloon at 18 atm     Kissing balloon post stenting angioplasty Lcx/OM1 w/2.75X15 mm and 3.0X8 mm  [place] balloons at 12    0% residual stenosis Excellent stent apposition and expansion and ostial mid Lcx coverage                          Echocardiogram 03/18/2021:  Normal LV systolic function with visual EF 60-65%. Left ventricle cavity  is normal in size. Moderate left ventricular hypertrophy. Normal global  wall motion. Indeterminate diastolic filling pattern, indeterminate LAP.  No significant valvular disease.  Small pericardial effusion. There is no hemodynamic significance.  Compared to study 12/06/2020: LVEF improved from 40-45% to 60-65%  otherwise no significant change.   Coronary intervention 12/06/2020: LM: Normal LAD: No significant disease Lcx: Prox thrombotic 95% stenosis, 60% ostial OM1 stenosis        Mid Lcx occlusion, likely chronic RCA: Mid 60% disease. Right-to-left collaterals to occluded Lcx   Successful percutaneous coronary intervention prox Lcx-OM1        PTCA and stent placement 3.0 X 30 mm Onyx Frontier drug-eluting  stent, post dilatation with 3.25X8 and 3.5X15 mm [place] balloons up to 20 atm   Distal embolization into small branches of OM1. Aggrastat bolus and infusion started.   Recent labs: 06/27/2021: Glucose 318, BUN/Cr 9/1.26. EGFR 49. Na/K 138/4.5. Rest of the CMP normal H/H 10.6/34.9. MCV 85. Platelets 350 HbA1C 11.5% Chol 75, TG 82, HDL 44, LDL 15 TSH 0.03 very low Free T4 1.6 normal   Review of Systems  Constitutional: Negative for malaise/fatigue.  Cardiovascular:  Negative for chest pain, dyspnea on exertion, leg swelling, palpitations and syncope.        There were no vitals filed for this visit.    Body mass index is 27.75 kg/m. Filed Weights   09/16/22 1504  Weight: 199 lb (90.3 kg)     Objective:   Physical Exam Vitals and nursing note reviewed.  Constitutional:      General: She is not in acute distress. Neck:     Vascular: No JVD.  Cardiovascular:     Rate and Rhythm: Normal rate and regular rhythm.      Pulses: Decreased pulses.     Heart sounds: Murmur heard.     Harsh midsystolic murmur is present with a grade of 2/6 at the upper right sternal border radiating to the neck.  Pulmonary:     Effort: Pulmonary effort is normal.     Breath sounds: Normal breath sounds. No wheezing or rales.  Abdominal:     Tenderness: There is no abdominal tenderness.  Musculoskeletal:     Right lower leg: No edema.     Left lower leg: No edema.         No diagnosis found.     Assessment & Recommendations:   62 year old African-American female with hypertension, hyperlipidemia, type 2 diabetes mellitus, tobacco dependence, CAD, HFrEF-now recovered EF, GERD  CAD: NSTEMI 11/2020. Culprit: thrombotic 95% stenosis Prox Lcx into large OM1 (11/2020) Non-culprit: Mid Lcx chronic occlusion with R-to-L grade 3 collaterals Now s/p CTO PCI to mid Lcx (06/2021) Moderate LM and mid RCA disease Completed DAPT. Continue aspirin 81 mg daily. No recurrent angina at this time.  Low EF on stress, but normal on echocardiogram (07/2022). Recent increase in anginal symptoms. Continue amlodipine to 10 mg daily.   It appears that she was not taking Coreg 12.5 mg bid. Resume the same. Continue Crestor and Praluent. LDL down to 15. (06/2021) Continue aggressive diabetes management.  HFrEF: Clinically euvolumic.  LVEF recovered. Entresto discontinued in the past due to AKI.  Continue losartan. Resume coreg as above.  Hypertension: Uncontrolled. Added Coreg 6.25 mg bid.  Mixed hyperlipidemia: LDL down to 15 on Praluent.    Type 2 DM:  As above.  F/u in 2 months    Elder Negus, MD Pager: [phone] Office: [phone]

## 2022-10-05 ENCOUNTER — Other Ambulatory Visit (HOSPITAL_COMMUNITY): Payer: Self-pay

## 2022-10-12 ENCOUNTER — Other Ambulatory Visit (INDEPENDENT_AMBULATORY_CARE_PROVIDER_SITE_OTHER): Payer: BC Managed Care – PPO

## 2022-10-12 ENCOUNTER — Ambulatory Visit: Payer: BC Managed Care – PPO | Admitting: Orthopedic Surgery

## 2022-10-12 ENCOUNTER — Encounter: Payer: Self-pay | Admitting: Orthopedic Surgery

## 2022-10-12 VITALS — Ht 71.0 in | Wt 193.0 lb

## 2022-10-12 DIAGNOSIS — G8929 Other chronic pain: Secondary | ICD-10-CM | POA: Diagnosis not present

## 2022-10-12 DIAGNOSIS — M25511 Pain in right shoulder: Secondary | ICD-10-CM

## 2022-10-12 DIAGNOSIS — M7730 Calcaneal spur, unspecified foot: Secondary | ICD-10-CM | POA: Insufficient documentation

## 2022-10-12 DIAGNOSIS — M722 Plantar fascial fibromatosis: Secondary | ICD-10-CM | POA: Insufficient documentation

## 2022-10-12 DIAGNOSIS — M79606 Pain in leg, unspecified: Secondary | ICD-10-CM | POA: Insufficient documentation

## 2022-10-12 MED ORDER — METHYLPREDNISOLONE ACETATE 40 MG/ML IJ SUSP
40.0000 mg | Freq: Once | INTRAMUSCULAR | Status: AC
  Administered 2022-10-12: 40 mg via INTRA_ARTICULAR

## 2022-10-12 NOTE — Patient Instructions (Signed)

## 2022-10-12 NOTE — Addendum Note (Signed)
Addended byCaffie Damme on: 10/12/2022 04:17 PM   Modules accepted: Orders

## 2022-10-12 NOTE — Progress Notes (Signed)
Chief Complaint  Patient presents with   Shoulder Pain    Right painful again for a couple months     62 year old female who has received injections in the right shoulder in the past status post rotator cuff repair and distal clavicle excision done elsewhere  Found to have adhesive capsulitis secondary to diabetes and previous surgery  Comes in with recurrent pain right shoulder and crepitance and popping  She still has quite a bit of stiffness  Examination shows a straight incision over the acromion down across the anterior deltoid she has crepitance on range of motion active flexion is less than 100 degrees passive range of motion is  120 degrees with pain and crepitation  External rotation only 30 degrees  X-ray shows a spur where the suture anchors went in and debride in the subacromial space  Minimal joint space narrowing  Subacromial injection performed  Reexamine 6 weeks possible MRI if no improvement   Procedure note the subacromial injection shoulder RIGHT    Verbal consent was obtained to inject the  RIGHT   Shoulder  Timeout was completed to confirm the injection site is a subacromial space of the  RIGHT  shoulder   Medication used Depo-Medrol 40 mg and lidocaine 1% 3 cc  Anesthesia was provided by ethyl chloride  The injection was performed in the RIGHT  posterior subacromial space. After pinning the skin with alcohol and anesthetized the skin with ethyl chloride the subacromial space was injected using a 20-gauge needle. There were no complications  Sterile dressing was applied.

## 2022-11-16 ENCOUNTER — Encounter: Payer: Self-pay | Admitting: Cardiology

## 2022-11-16 ENCOUNTER — Ambulatory Visit: Payer: BC Managed Care – PPO | Admitting: Cardiology

## 2022-11-16 VITALS — BP 147/60 | HR 78 | Resp 16 | Ht 71.0 in | Wt 202.0 lb

## 2022-11-16 DIAGNOSIS — I25118 Atherosclerotic heart disease of native coronary artery with other forms of angina pectoris: Secondary | ICD-10-CM

## 2022-11-16 DIAGNOSIS — I10 Essential (primary) hypertension: Secondary | ICD-10-CM

## 2022-11-16 DIAGNOSIS — G720 Drug-induced myopathy: Secondary | ICD-10-CM

## 2022-11-16 MED ORDER — AMLODIPINE BESYLATE 5 MG PO TABS: 5.0000 mg | ORAL_TABLET | Freq: Every day | ORAL | 3 refills | Status: DC

## 2022-11-16 MED ORDER — REPATHA SURECLICK 140 MG/ML ~~LOC~~ SOAJ: 140.0000 mg | SUBCUTANEOUS | 5 refills | Status: AC

## 2022-11-16 NOTE — Progress Notes (Signed)
Patient referred by [name], NP for coronary artery disease  Subjective:   Monica Bell, female    DOB: Jul 24, 1960, 62 y.o.   MRN: [id number]  Chief Complaint  Patient presents with   Coronary Artery Disease   Follow-up    2 month    HPI  62 year old African-American female with hypertension, hyperlipidemia, type 2 diabetes mellitus, tobacco dependence, CAD, HFrEF-now recovered EF, GERD  Patient underwent successful mid Lcx CTO PCI (06/2021). She denies any recurrent angina symptoms at this time. Blood pressure is improving. It appears that she was never on PCSK9i. She has tried several statins but has had myalgias.    Current Outpatient Medications:    amLODipine (NORVASC) 10 MG tablet, Take 1 tablet (10 mg total) by mouth daily., Disp: 90 tablet, Rfl: 3   Calcium Carbonate-Vitamin D (CALTRATE 600+D PO), Take 1 tablet by mouth daily., Disp: , Rfl:    carvedilol (COREG) 6.25 MG tablet, Take 1 tablet (6.25 mg total) by mouth 2 (two) times daily with a meal., Disp: 180 tablet, Rfl: 3   cetirizine (ZYRTEC) 10 MG tablet, Take 10 mg by mouth daily., Disp: , Rfl:    glipiZIDE (GLUCOTROL) 10 MG tablet, Take 10 mg by mouth daily before breakfast., Disp: , Rfl:    GNP ASPIRIN LOW DOSE 81 MG EC tablet, TAKE 1 TABLET BY MOUTH ONCE DAILY. SWALLOW WHOLE. (Patient taking differently: Take 81 mg by mouth daily.), Disp: 90 tablet, Rfl: 1   insulin aspart (NOVOLOG) 100 UNIT/ML injection, Inject 15 Units into the skin 3 (three) times daily before meals., Disp: , Rfl:    levothyroxine (SYNTHROID) 125 MCG tablet, Take 125 mcg by mouth every morning., Disp: , Rfl:    losartan (COZAAR) 25 MG tablet, Take 1 tablet (25 mg total) by mouth daily., Disp: 30 tablet, Rfl: 3   magnesium oxide (MAG-OX) 400 MG tablet, Take 400 mg by mouth daily., Disp: , Rfl:    nicotine (NICODERM CQ - DOSED IN MG/24 HR) 7 mg/24hr patch, Place 1 patch (7 mg total) onto the skin daily. (Patient not taking: Reported  on 10/12/2022), Disp: 28 patch, Rfl: 2   nitroGLYCERIN (NITROSTAT) 0.4 MG SL tablet, Place 1 tablet (0.4 mg total) under the tongue every 5 (five) minutes as needed for chest pain. (Patient not taking: Reported on 10/12/2022), Disp: 30 tablet, Rfl: 3   omeprazole (PRILOSEC) 20 MG capsule, Take 20 mg by mouth daily., Disp: , Rfl:    rosuvastatin (CRESTOR) 40 MG tablet, Take 1 tablet (40 mg total) by mouth daily., Disp: 30 tablet, Rfl: 0   TRESIBA FLEXTOUCH 200 UNIT/ML FlexTouch Pen, Inject 55 Units into the skin daily., Disp: , Rfl:    vitamin B-12 (CYANOCOBALAMIN) 1000 MCG tablet, Take 1,000 mcg by mouth daily., Disp: , Rfl:   Cardiovascular and other pertinent studies:  EKG 07/23/2022: Sinus rhythm 68 bpm Nonspecific T-abnormality  Exercise Sestamibi stress test 08/24/2022: Exercise nuclear stress test was performed using Bruce protocol.   1 Day Rest and Stress images. Exercise time 3 minutes 53 seconds on Bruce protocol, achieved 5.7 METS, 90% of APMHR.  Hypertensive response to exercise: Yes (resting 140/80, peak 210/72) Stress ECG positive for ischemia.   Normal myocardial perfusion, without convincing evidence of reversible ischemia or prior infarct. Left ventricular size slightly above normal limits, calculated LVEF 38% (visually appears preserved).  Intermediate risk study - given the reduced LVEF by gated SPECT, reduced functional capacity for age, hypertensive response to exercise, and  EKG changes. No prior studies available for comparison. Clinical correlation required.    Coronary intervention 07/15/2021: LM: Mod 40% disease with MLA 7-9 mm2 LAD: Minimal diffuse disease Lcx: Prox 40-50% disease        Mid Lcx CTO        Patent OM1 stent with no restenosis RCA: Mid 40% disease          Grade 2 collaterals to occluded Lcx   LVEDP normal   (IVUS) guided successful percutaneous coronary intervention mid Lcx (CTO intervention)     PTCA and stent placement 2.25 X 26 mm Onyx  Frontier drug-eluting stent     Proximal optimization with 2.75X15 mm [place] balloon at 18 atm     Kissing balloon post stenting angioplasty Lcx/OM1 w/2.75X15 mm and 3.0X8 mm [place] balloons at 12    0% residual stenosis Excellent stent apposition and expansion and ostial mid Lcx coverage                          Echocardiogram 03/18/2021:  Normal LV systolic function with visual EF 60-65%. Left ventricle cavity  is normal in size. Moderate left ventricular hypertrophy. Normal global  wall motion. Indeterminate diastolic filling pattern, indeterminate LAP.  No significant valvular disease.  Small pericardial effusion. There is no hemodynamic significance.  Compared to study 12/06/2020: LVEF improved from 40-45% to 60-65%  otherwise no significant change.   Coronary intervention 12/06/2020: LM: Normal LAD: No significant disease Lcx: Prox thrombotic 95% stenosis, 60% ostial OM1 stenosis        Mid Lcx occlusion, likely chronic RCA: Mid 60% disease. Right-to-left collaterals to occluded Lcx   Successful percutaneous coronary intervention prox Lcx-OM1        PTCA and stent placement 3.0 X 30 mm Onyx Frontier drug-eluting  stent, post dilatation with 3.25X8 and 3.5X15 mm [place] balloons up to 20 atm   Distal embolization into small branches of OM1. Aggrastat bolus and infusion started.   Recent labs: 06/27/2021: Glucose 318, BUN/Cr 9/1.26. EGFR 49. Na/K 138/4.5. Rest of the CMP normal H/H 10.6/34.9. MCV 85. Platelets 350 HbA1C 11.5% Chol 75, TG 82, HDL 44, LDL 15 TSH 0.03 very low Free T4 1.6 normal   Review of Systems  Constitutional: Negative for malaise/fatigue.  Cardiovascular:  Negative for chest pain, dyspnea on exertion, leg swelling, palpitations and syncope.        Vitals:   11/16/22 1513  BP: (!) 147/60  Pulse: 78  Resp: 16  SpO2: 96%      Body mass index is 28.17 kg/m. Filed Weights   11/16/22 1513  Weight: 202 lb (91.6 kg)      Objective:    Physical Exam Vitals and nursing note reviewed.  Constitutional:      General: She is not in acute distress. Neck:     Vascular: No JVD.  Cardiovascular:     Rate and Rhythm: Normal rate and regular rhythm.     Pulses: Decreased pulses.     Heart sounds: Murmur heard.     Harsh midsystolic murmur is present with a grade of 2/6 at the upper right sternal border radiating to the neck.  Pulmonary:     Effort: Pulmonary effort is normal.     Breath sounds: Normal breath sounds. No wheezing or rales.  Abdominal:     Tenderness: There is no abdominal tenderness.  Musculoskeletal:     Right lower leg: No edema.     Left  lower leg: No edema.        Visit diagnoses:    ICD-10-CM   1. Coronary artery disease of native artery of native heart with stable angina pectoris (HCC)  I25.118 Evolocumab (REPATHA SURECLICK) 140 MG/ML SOAJ    amLODipine (NORVASC) 5 MG tablet    Lipid panel    Basic metabolic panel    HgB A1c    2. Statin myopathy  G72.0 Evolocumab (REPATHA SURECLICK) 140 MG/ML SOAJ   T46.6X5A     3. Primary hypertension  I10          Assessment & Recommendations:   62 year old African-American female with hypertension, hyperlipidemia, type 2 diabetes mellitus, tobacco dependence, CAD, HFrEF-now recovered EF, GERD   CAD: NSTEMI 11/2020. Culprit: thrombotic 95% stenosis Prox Lcx into large OM1 (11/2020) Non-culprit: Mid Lcx chronic occlusion with R-to-L grade 3 collaterals Now s/p CTO PCI to mid Lcx (06/2021) Moderate LM and mid RCA disease Completed DAPT. Continue aspirin 81 mg daily. No recurrent angina at this time.  Low EF on stress, but normal on echocardiogram (07/2022). Continue coreg, resume amlodipine. Continue aggressive diabetes management. See below re: lipid management.  HFrEF: Clinically euvolumic.  LVEF recovered. Entresto discontinued in the past due to AKI.  Continue losartan, coreg.  Hypertension: Uncontrolled. Resumed amlodipine 5 mg  daily.  Mixed hyperlipidemia: It appears that she was never on PCSK9i. She has tried several statins but has had myalgia. Check lipid panel. Will get prior auth for Repatha.  Type 2 DM:  As above.  F/u in 2 months    Elder Negus, MD Pager: [phone] Office: [phone]

## 2022-11-26 ENCOUNTER — Ambulatory Visit: Payer: BC Managed Care – PPO | Admitting: Orthopedic Surgery

## 2022-11-26 ENCOUNTER — Encounter: Payer: Self-pay | Admitting: Orthopedic Surgery

## 2022-11-26 VITALS — BP 162/73 | HR 80 | Ht 71.0 in | Wt 200.0 lb

## 2022-11-26 DIAGNOSIS — M25511 Pain in right shoulder: Secondary | ICD-10-CM | POA: Diagnosis not present

## 2022-11-26 DIAGNOSIS — G8929 Other chronic pain: Secondary | ICD-10-CM

## 2022-11-26 DIAGNOSIS — M75101 Unspecified rotator cuff tear or rupture of right shoulder, not specified as traumatic: Secondary | ICD-10-CM

## 2022-11-26 MED ORDER — METHYLPREDNISOLONE ACETATE 40 MG/ML IJ SUSP
40.0000 mg | Freq: Once | INTRAMUSCULAR | Status: AC
Start: 2022-11-26 — End: 2022-11-26
  Administered 2022-11-26: 40 mg via INTRA_ARTICULAR

## 2022-11-26 NOTE — Progress Notes (Signed)
Chief Complaint  Patient presents with   Shoulder Pain    Right improving    Encounter Diagnoses  Name Primary?   Chronic right shoulder pain Yes   Rotator cuff syndrome of right shoulder     10/12/22 Chief Complaint  Patient presents with   Shoulder Pain      Right painful again for a couple months       62 year old female who has received injections in the right shoulder in the past status post rotator cuff repair and distal clavicle excision done elsewhere   Found to have adhesive capsulitis secondary to diabetes and previous surgery   Comes in with recurrent pain right shoulder and crepitance and popping   Procedure note the subacromial injection shoulder RIGHT   Today:   Monica Bell obtained good relief from the last injection and she would like to have another injection.  Her range of motion did definitely improve but she would like to have another injection which is okay   Procedure note the subacromial injection shoulder RIGHT    Verbal consent was obtained to inject the  RIGHT   Shoulder  Timeout was completed to confirm the injection site is a subacromial space of the  RIGHT  shoulder   Medication used Depo-Medrol 40 mg and lidocaine 1% 3 cc  Anesthesia was provided by ethyl chloride  The injection was performed in the RIGHT  posterior subacromial space. After pinning the skin with alcohol and anesthetized the skin with ethyl chloride the subacromial space was injected using a 20-gauge needle. There were no complications  Sterile dressing was applied.

## 2022-11-26 NOTE — Addendum Note (Signed)
Addended byCaffie Damme on: 11/26/2022 03:35 PM   Modules accepted: Orders

## 2023-02-17 ENCOUNTER — Ambulatory Visit: Payer: Self-pay | Admitting: Cardiology

## 2023-03-07 ENCOUNTER — Other Ambulatory Visit: Payer: Self-pay

## 2023-03-07 ENCOUNTER — Emergency Department (HOSPITAL_COMMUNITY)
Admission: EM | Admit: 2023-03-07 | Discharge: 2023-03-07 | Disposition: A | Discharge status: Home / Self Care | Payer: BC Managed Care – PPO | Attending: Emergency Medicine | Admitting: Emergency Medicine

## 2023-03-07 ENCOUNTER — Emergency Department (HOSPITAL_COMMUNITY): Payer: BC Managed Care – PPO

## 2023-03-07 DIAGNOSIS — R059 Cough, unspecified: Secondary | ICD-10-CM | POA: Insufficient documentation

## 2023-03-07 DIAGNOSIS — R058 Other specified cough: Secondary | ICD-10-CM

## 2023-03-07 DIAGNOSIS — Z1152 Encounter for screening for COVID-19: Secondary | ICD-10-CM | POA: Diagnosis not present

## 2023-03-07 DIAGNOSIS — E1165 Type 2 diabetes mellitus with hyperglycemia: Secondary | ICD-10-CM | POA: Diagnosis not present

## 2023-03-07 DIAGNOSIS — Z7984 Long term (current) use of oral hypoglycemic drugs: Secondary | ICD-10-CM | POA: Diagnosis not present

## 2023-03-07 DIAGNOSIS — Z79899 Other long term (current) drug therapy: Secondary | ICD-10-CM | POA: Diagnosis not present

## 2023-03-07 DIAGNOSIS — I10 Essential (primary) hypertension: Secondary | ICD-10-CM | POA: Diagnosis not present

## 2023-03-07 LAB — CBC WITH DIFFERENTIAL/PLATELET
Abs Immature Granulocytes: 0.05 10*3/uL (ref 0.00–0.07)
Basophils Absolute: 0.1 10*3/uL (ref 0.0–0.1)
Basophils Relative: 1 %
Eosinophils Absolute: 0.4 10*3/uL (ref 0.0–0.5)
Eosinophils Relative: 3 %
HCT: 35.1 % — ABNORMAL LOW (ref 36.0–46.0)
Hemoglobin: 11 g/dL — ABNORMAL LOW (ref 12.0–15.0)
Immature Granulocytes: 0 %
Lymphocytes Relative: 26 %
Lymphs Abs: 3.8 10*3/uL (ref 0.7–4.0)
MCH: 23.5 pg — ABNORMAL LOW (ref 26.0–34.0)
MCHC: 31.3 g/dL (ref 30.0–36.0)
MCV: 75 fL — ABNORMAL LOW (ref 80.0–100.0)
Monocytes Absolute: 1.2 10*3/uL — ABNORMAL HIGH (ref 0.1–1.0)
Monocytes Relative: 8 %
Neutro Abs: 8.9 10*3/uL — ABNORMAL HIGH (ref 1.7–7.7)
Neutrophils Relative %: 62 %
Platelets: 422 10*3/uL — ABNORMAL HIGH (ref 150–400)
RBC: 4.68 MIL/uL (ref 3.87–5.11)
RDW: 17.8 % — ABNORMAL HIGH (ref 11.5–15.5)
WBC: 14.5 10*3/uL — ABNORMAL HIGH (ref 4.0–10.5)
nRBC: 0 % (ref 0.0–0.2)

## 2023-03-07 LAB — RESP PANEL BY RT-PCR (RSV, FLU A&B, COVID)  RVPGX2
Influenza A by PCR: NEGATIVE
Influenza B by PCR: NEGATIVE
Resp Syncytial Virus by PCR: NEGATIVE
SARS Coronavirus 2 by RT PCR: NEGATIVE

## 2023-03-07 LAB — BASIC METABOLIC PANEL
Anion gap: 7 (ref 5–15)
BUN: 18 mg/dL (ref 8–23)
CO2: 23 mmol/L (ref 22–32)
Calcium: 8.3 mg/dL — ABNORMAL LOW (ref 8.9–10.3)
Chloride: 104 mmol/L (ref 98–111)
Creatinine, Ser: 1.14 mg/dL — ABNORMAL HIGH (ref 0.44–1.00)
GFR, Estimated: 55 mL/min — ABNORMAL LOW (ref >60)
Glucose, Bld: 215 mg/dL — ABNORMAL HIGH (ref 70–99)
Potassium: 4 mmol/L (ref 3.5–5.1)
Sodium: 134 mmol/L — ABNORMAL LOW (ref 135–145)

## 2023-03-07 MED ORDER — GUAIFENESIN ER 600 MG PO TB12: 1200.0000 mg | ORAL_TABLET | Freq: Two times a day (BID) | ORAL | 0 refills | Status: AC

## 2023-03-07 MED ORDER — ACETAMINOPHEN 500 MG PO TABS
1000.0000 mg | ORAL_TABLET | Freq: Once | ORAL | Status: AC
  Administered 2023-03-07: 1000 mg via ORAL
  Filled 2023-03-07: qty 2

## 2023-03-07 MED ORDER — DOXYCYCLINE HYCLATE 100 MG PO CAPS: 100.0000 mg | ORAL_CAPSULE | Freq: Two times a day (BID) | ORAL | 0 refills | Status: AC

## 2023-03-07 MED ORDER — DOXYCYCLINE HYCLATE 100 MG PO TABS
100.0000 mg | ORAL_TABLET | Freq: Once | ORAL | Status: AC
  Administered 2023-03-07: 100 mg via ORAL
  Filled 2023-03-07: qty 1

## 2023-03-07 NOTE — ED Provider Notes (Signed)
[place] EMERGENCY DEPARTMENT AT [facility] Provider Note   CSN: [id number] Arrival date & time: 03/07/23  8469     History  No chief complaint on file.   Monica Bell is a 62 y.o. female.  He has PMH of hypertension, high cholesterol, type 2 diabetes, heart disease, GERD and tobacco abuse, as well as history of HFrEF with recovered EF.  Patient presents to the ER today complaining of sinus congestion and cough.  It started 3 to 4 days ago with a scratchy voice and comfortable white sputum, she has been taking honey and over-the-counter cough syrup with minimal relief.  She is now having green and brown sputum production.  Denies chest pain or shortness of breath, no abdominal pain, no nausea or vomiting.  She has noted body aches and chills but has not checked her temperature.  She does report mild headache today.  HPI     Home Medications Prior to Admission medications   Medication Sig Start Date End Date Taking? Authorizing Provider  amLODipine (NORVASC) 5 MG tablet Take 1 tablet (5 mg total) by mouth daily. 11/16/22   Patwardhan, Anabel Bene, MD  Calcium Carbonate-Vitamin D (CALTRATE 600+D PO) Take 1 tablet by mouth daily.    [provider]  carvedilol (COREG) 6.25 MG tablet Take 1 tablet (6.25 mg total) by mouth 2 (two) times daily with a meal. 09/16/22   Patwardhan, Manish J, MD  cetirizine (ZYRTEC) 10 MG tablet Take 10 mg by mouth daily.    [provider]  Evolocumab (REPATHA SURECLICK) 140 MG/ML SOAJ Inject 140 mg into the skin every 14 (fourteen) days. 11/16/22   Patwardhan, Anabel Bene, MD  glipiZIDE (GLUCOTROL) 10 MG tablet Take 10 mg by mouth daily before breakfast. 02/24/22   [provider]  GNP ASPIRIN LOW DOSE 81 MG EC tablet TAKE 1 TABLET BY MOUTH ONCE DAILY. SWALLOW WHOLE. Patient taking differently: Take 81 mg by mouth daily. 03/14/21   Patwardhan, Anabel Bene, MD  insulin aspart (NOVOLOG) 100 UNIT/ML injection Inject 15 Units  into the skin 3 (three) times daily before meals.    [provider]  levothyroxine (SYNTHROID) 125 MCG tablet Take 125 mcg by mouth every morning. 05/14/22   [provider]  losartan (COZAAR) 25 MG tablet Take 1 tablet (25 mg total) by mouth daily. 01/02/22   Patwardhan, Anabel Bene, MD  magnesium oxide (MAG-OX) 400 MG tablet Take 400 mg by mouth daily.    [provider]  nicotine (NICODERM CQ - DOSED IN MG/24 HR) 7 mg/24hr patch Place 1 patch (7 mg total) onto the skin daily. 07/23/22   Patwardhan, Anabel Bene, MD  nitroGLYCERIN (NITROSTAT) 0.4 MG SL tablet Place 1 tablet (0.4 mg total) under the tongue every 5 (five) minutes as needed for chest pain. 07/23/22 11/16/22  Patwardhan, Anabel Bene, MD  omeprazole (PRILOSEC) 20 MG capsule Take 20 mg by mouth daily. 04/24/22   [provider]  rosuvastatin (CRESTOR) 40 MG tablet Take 1 tablet (40 mg total) by mouth daily. Patient not taking: Reported on 11/16/2022 12/08/20   Arrien, York Ram, MD  TRESIBA FLEXTOUCH 200 UNIT/ML FlexTouch Pen Inject 55 Units into the skin daily. 10/18/20   [provider]  vitamin B-12 (CYANOCOBALAMIN) 1000 MCG tablet Take 1,000 mcg by mouth daily.    [provider]      Allergies    Patient has no known allergies.    Review of Systems   Review of Systems  Physical Exam Updated Vital Signs There were no vitals taken for this visit. Physical Exam Vitals and nursing note reviewed.  Constitutional:      General: She is not in acute distress.    Appearance: She is well-developed.  HENT:     Head: Normocephalic and atraumatic.     Mouth/Throat:     Mouth: Mucous membranes are moist.     Pharynx: Oropharynx is clear. Uvula midline.     Tonsils: No tonsillar exudate or tonsillar abscesses.  Eyes:     Extraocular Movements: Extraocular movements intact.     Conjunctiva/sclera: Conjunctivae normal.     Pupils: Pupils are equal, round, and reactive to light.   Cardiovascular:     Rate and Rhythm: Normal rate and regular rhythm.     Heart sounds: No murmur heard. Pulmonary:     Effort: Pulmonary effort is normal. No respiratory distress.     Breath sounds: Normal breath sounds.  Abdominal:     Palpations: Abdomen is soft.     Tenderness: There is no abdominal tenderness.  Musculoskeletal:        General: No swelling.     Cervical back: Neck supple.  Skin:    General: Skin is warm and dry.     Capillary Refill: Capillary refill takes less than 2 seconds.  Neurological:     General: No focal deficit present.     Mental Status: She is alert and oriented to person, place, and time.  Psychiatric:        Mood and Affect: Mood normal.     ED Results / Procedures / Treatments   Labs (all labs ordered are listed, but only abnormal results are displayed) Labs Reviewed  RESP PANEL BY RT-PCR (RSV, FLU A&B, COVID)  [id number]    EKG None  Radiology No results found.  Procedures Procedures    Medications Ordered in ED Medications - No data to display  ED Course/ Medical Decision Making/ A&P                                 Medical Decision Making This patient presents to the ED for concern of productive cough, congestion x 4 days, this involves an extensive number of treatment options, and is a complaint that carries with it a high risk of complications and morbidity.  The differential diagnosis includes pneumonia, bronchitis, sinusitis, allergic rhinitis, COPD exacerbation, other   Co morbidities that complicate the patient evaluation  Tobacco abuse, heart disease, diabetes   Additional history obtained:  Additional history obtained from EMR External records from outside source obtained and reviewed including notes   Lab Tests:  I Ordered, and personally interpreted labs.  The pertinent results include: Noted to have leukocytosis, hemoglobin is 11 which is around patient's baseline, negative COVID flu RSV, BMP shows baseline  renal function and mild hyperglycemia at 215   Imaging Studies ordered:  I ordered imaging studies including Chest Xray  I independently visualized and interpreted imaging which showed no pulmonary edema or infiltrate, no pneumothorax I agree with the radiologist interpretation     Problem List / ED Course / Critical interventions / Medication management  Productive cough-patient had productive cough that has been worsening over the past several days also having congestion and mild sore throat.  Sputum is now green to brown.  Patient is not have any chest pain or shortness of breath.  She is not febrile.  Viral panel negative, he has leukocytosis, BMP is reassuring.  Chest x-ray is normal but given her smoking and multiple medical comorbidities with purulent sputum will treat empirically for pneumonia.  Patient is nontoxic in appearance with no tachypnea, no tachycardia, satting 100% on room air.  I consider need for admission, patient is stable for discharge at this time however.  She is advised on close follow-up and strict return precautions. I ordered medication including Tylenol for headache Reevaluation of the patient after these medicines showed that the patient improved I have reviewed the patients home medicines and have made adjustments as needed       Amount and/or Complexity of Data Reviewed Labs: ordered. Radiology: ordered.  Risk OTC drugs.           Final Clinical Impression(s) / ED Diagnoses Final diagnoses:  None    Rx / DC Orders ED Discharge Orders     None         Ma Rings, PA-C 03/07/23 1201    Pricilla Loveless, MD 03/10/23 1510

## 2023-03-07 NOTE — ED Triage Notes (Signed)
Pt. Came in this AM complaining of cough, congestion, and generalized weakness for about 4 days now. No sick contacts that she knows of. No chest pain.

## 2023-03-07 NOTE — Discharge Instructions (Addendum)
It was a pleasure taking care of you today.  You were seen in the emergency department for a productive cough and sinus congestion for the past several days.  You are negative for COVID-19, influenza and RSV viruses, your CBC did show a slightly elevated white blood cell count and your blood sugar was somewhat elevated today with a blood sugar of 215.  Your chest x-ray did not show any pneumonia but given your productive cough with green and brown sputum that is getting worse, he may have an early pneumonia that did not show up on chest x-ray so we are going to start you on antibiotics.  Drink plenty of fluids and rest, follow-up closely with your primary care doctor if you have chest pain, shortness of breath, fevers, vomiting or any other new or worsening symptoms you need to come back to the ER right away.  You should stop smoking.  At this increases risk for breathing problems including COPD, increased risk of pneumonia and lung cancer.  Your blood pressure was elevated today.  Follow-up closely with your primary care doctor for recheck.

## 2023-03-16 ENCOUNTER — Encounter (HOSPITAL_COMMUNITY): Payer: Self-pay | Admitting: *Deleted

## 2023-03-16 ENCOUNTER — Inpatient Hospital Stay (HOSPITAL_COMMUNITY)
Admission: EM | Admit: 2023-03-16 | Discharge: 2023-03-19 | DRG: 481 | Disposition: A | Discharge status: Home Health | Payer: BC Managed Care – PPO | Attending: Family Medicine | Admitting: Family Medicine

## 2023-03-16 ENCOUNTER — Emergency Department (HOSPITAL_COMMUNITY): Payer: BC Managed Care – PPO

## 2023-03-16 ENCOUNTER — Other Ambulatory Visit: Payer: Self-pay

## 2023-03-16 DIAGNOSIS — S72001A Fracture of unspecified part of neck of right femur, initial encounter for closed fracture: Secondary | ICD-10-CM | POA: Diagnosis present

## 2023-03-16 DIAGNOSIS — K219 Gastro-esophageal reflux disease without esophagitis: Secondary | ICD-10-CM | POA: Diagnosis present

## 2023-03-16 DIAGNOSIS — F17210 Nicotine dependence, cigarettes, uncomplicated: Secondary | ICD-10-CM | POA: Diagnosis present

## 2023-03-16 DIAGNOSIS — Z794 Long term (current) use of insulin: Secondary | ICD-10-CM

## 2023-03-16 DIAGNOSIS — I252 Old myocardial infarction: Secondary | ICD-10-CM | POA: Diagnosis not present

## 2023-03-16 DIAGNOSIS — W010XXA Fall on same level from slipping, tripping and stumbling without subsequent striking against object, initial encounter: Secondary | ICD-10-CM | POA: Diagnosis present

## 2023-03-16 DIAGNOSIS — E890 Postprocedural hypothyroidism: Secondary | ICD-10-CM | POA: Diagnosis present

## 2023-03-16 DIAGNOSIS — Y92008 Other place in unspecified non-institutional (private) residence as the place of occurrence of the external cause: Secondary | ICD-10-CM

## 2023-03-16 DIAGNOSIS — Z825 Family history of asthma and other chronic lower respiratory diseases: Secondary | ICD-10-CM | POA: Diagnosis not present

## 2023-03-16 DIAGNOSIS — Z7984 Long term (current) use of oral hypoglycemic drugs: Secondary | ICD-10-CM | POA: Diagnosis not present

## 2023-03-16 DIAGNOSIS — I2510 Atherosclerotic heart disease of native coronary artery without angina pectoris: Secondary | ICD-10-CM | POA: Diagnosis present

## 2023-03-16 DIAGNOSIS — I10 Essential (primary) hypertension: Secondary | ICD-10-CM | POA: Diagnosis not present

## 2023-03-16 DIAGNOSIS — N1831 Chronic kidney disease, stage 3a: Secondary | ICD-10-CM | POA: Diagnosis present

## 2023-03-16 DIAGNOSIS — Z79899 Other long term (current) drug therapy: Secondary | ICD-10-CM | POA: Diagnosis not present

## 2023-03-16 DIAGNOSIS — M545 Low back pain, unspecified: Secondary | ICD-10-CM | POA: Diagnosis present

## 2023-03-16 DIAGNOSIS — W19XXXA Unspecified fall, initial encounter: Secondary | ICD-10-CM | POA: Diagnosis not present

## 2023-03-16 DIAGNOSIS — M170 Bilateral primary osteoarthritis of knee: Secondary | ICD-10-CM | POA: Diagnosis present

## 2023-03-16 DIAGNOSIS — S72001D Fracture of unspecified part of neck of right femur, subsequent encounter for closed fracture with routine healing: Secondary | ICD-10-CM | POA: Diagnosis not present

## 2023-03-16 DIAGNOSIS — E1165 Type 2 diabetes mellitus with hyperglycemia: Secondary | ICD-10-CM | POA: Diagnosis present

## 2023-03-16 DIAGNOSIS — Z79890 Hormone replacement therapy (postmenopausal): Secondary | ICD-10-CM | POA: Diagnosis not present

## 2023-03-16 DIAGNOSIS — E1122 Type 2 diabetes mellitus with diabetic chronic kidney disease: Secondary | ICD-10-CM | POA: Diagnosis present

## 2023-03-16 DIAGNOSIS — Z8249 Family history of ischemic heart disease and other diseases of the circulatory system: Secondary | ICD-10-CM | POA: Diagnosis not present

## 2023-03-16 DIAGNOSIS — Z823 Family history of stroke: Secondary | ICD-10-CM | POA: Diagnosis not present

## 2023-03-16 DIAGNOSIS — I130 Hypertensive heart and chronic kidney disease with heart failure and stage 1 through stage 4 chronic kidney disease, or unspecified chronic kidney disease: Secondary | ICD-10-CM | POA: Diagnosis present

## 2023-03-16 DIAGNOSIS — S72009A Fracture of unspecified part of neck of unspecified femur, initial encounter for closed fracture: Secondary | ICD-10-CM | POA: Diagnosis present

## 2023-03-16 DIAGNOSIS — I5022 Chronic systolic (congestive) heart failure: Secondary | ICD-10-CM | POA: Diagnosis present

## 2023-03-16 DIAGNOSIS — W108XXA Fall (on) (from) other stairs and steps, initial encounter: Secondary | ICD-10-CM | POA: Diagnosis present

## 2023-03-16 DIAGNOSIS — Z833 Family history of diabetes mellitus: Secondary | ICD-10-CM

## 2023-03-16 DIAGNOSIS — Y9301 Activity, walking, marching and hiking: Secondary | ICD-10-CM | POA: Diagnosis present

## 2023-03-16 LAB — BASIC METABOLIC PANEL
Anion gap: 6 (ref 5–15)
BUN: 10 mg/dL (ref 8–23)
CO2: 26 mmol/L (ref 22–32)
Calcium: 8.7 mg/dL — ABNORMAL LOW (ref 8.9–10.3)
Chloride: 107 mmol/L (ref 98–111)
Creatinine, Ser: 1.22 mg/dL — ABNORMAL HIGH (ref 0.44–1.00)
GFR, Estimated: 50 mL/min — ABNORMAL LOW (ref >60)
Glucose, Bld: 167 mg/dL — ABNORMAL HIGH (ref 70–99)
Potassium: 4.1 mmol/L (ref 3.5–5.1)
Sodium: 139 mmol/L (ref 135–145)

## 2023-03-16 LAB — CBC
HCT: 35.7 % — ABNORMAL LOW (ref 36.0–46.0)
Hemoglobin: 11 g/dL — ABNORMAL LOW (ref 12.0–15.0)
MCH: 23.5 pg — ABNORMAL LOW (ref 26.0–34.0)
MCHC: 30.8 g/dL (ref 30.0–36.0)
MCV: 76.1 fL — ABNORMAL LOW (ref 80.0–100.0)
Platelets: 369 10*3/uL (ref 150–400)
RBC: 4.69 MIL/uL (ref 3.87–5.11)
RDW: 18.7 % — ABNORMAL HIGH (ref 11.5–15.5)
WBC: 16.2 10*3/uL — ABNORMAL HIGH (ref 4.0–10.5)
nRBC: 0 % (ref 0.0–0.2)

## 2023-03-16 LAB — HEMOGLOBIN A1C
Hgb A1c MFr Bld: 10.4 % — ABNORMAL HIGH (ref 4.8–5.6)
Mean Plasma Glucose: 251.78 mg/dL

## 2023-03-16 MED ORDER — NITROGLYCERIN 0.4 MG SL SUBL: 0.4000 mg | SUBLINGUAL_TABLET | SUBLINGUAL | Status: DC | PRN

## 2023-03-16 MED ORDER — LOSARTAN POTASSIUM 50 MG PO TABS
25.0000 mg | ORAL_TABLET | Freq: Every day | ORAL | Status: DC
  Administered 2023-03-16 – 2023-03-19 (×3): 25 mg via ORAL
  Filled 2023-03-16 (×3): qty 1

## 2023-03-16 MED ORDER — ACETAMINOPHEN 650 MG RE SUPP: 650.0000 mg | Freq: Four times a day (QID) | RECTAL | Status: DC | PRN

## 2023-03-16 MED ORDER — HYDROCODONE-ACETAMINOPHEN 5-325 MG PO TABS
1.0000 | ORAL_TABLET | ORAL | Status: DC | PRN
  Administered 2023-03-16 – 2023-03-17 (×3): 1 via ORAL
  Filled 2023-03-16: qty 1
  Filled 2023-03-16: qty 2
  Filled 2023-03-16 (×2): qty 1

## 2023-03-16 MED ORDER — ENOXAPARIN SODIUM 40 MG/0.4ML IJ SOSY
40.0000 mg | PREFILLED_SYRINGE | INTRAMUSCULAR | Status: DC
  Administered 2023-03-16: 40 mg via SUBCUTANEOUS
  Filled 2023-03-16: qty 0.4

## 2023-03-16 MED ORDER — BISACODYL 5 MG PO TBEC: 5.0000 mg | DELAYED_RELEASE_TABLET | Freq: Every day | ORAL | Status: DC | PRN

## 2023-03-16 MED ORDER — NICOTINE 14 MG/24HR TD PT24: 14.0000 mg | MEDICATED_PATCH | Freq: Every day | TRANSDERMAL | Status: DC | PRN

## 2023-03-16 MED ORDER — ACETAMINOPHEN 325 MG PO TABS
650.0000 mg | ORAL_TABLET | Freq: Four times a day (QID) | ORAL | Status: DC | PRN
  Administered 2023-03-18 (×2): 650 mg via ORAL
  Filled 2023-03-16 (×2): qty 2

## 2023-03-16 MED ORDER — ASPIRIN 81 MG PO TBEC
81.0000 mg | DELAYED_RELEASE_TABLET | Freq: Every day | ORAL | Status: DC
  Administered 2023-03-16 – 2023-03-17 (×2): 81 mg via ORAL
  Filled 2023-03-16 (×2): qty 1

## 2023-03-16 MED ORDER — INSULIN ASPART 100 UNIT/ML IJ SOLN: 15.0000 [IU] | Freq: Three times a day (TID) | INTRAMUSCULAR | Status: DC

## 2023-03-16 MED ORDER — ONDANSETRON HCL 4 MG/2ML IJ SOLN
4.0000 mg | Freq: Once | INTRAMUSCULAR | Status: AC
  Administered 2023-03-16: 4 mg via INTRAVENOUS
  Filled 2023-03-16: qty 2

## 2023-03-16 MED ORDER — LEVOTHYROXINE SODIUM 125 MCG PO TABS
125.0000 ug | ORAL_TABLET | Freq: Every morning | ORAL | Status: DC
  Administered 2023-03-17 – 2023-03-19 (×2): 125 ug via ORAL
  Filled 2023-03-16 (×2): qty 1

## 2023-03-16 MED ORDER — INSULIN ASPART 100 UNIT/ML IJ SOLN
0.0000 [IU] | Freq: Three times a day (TID) | INTRAMUSCULAR | Status: DC
  Administered 2023-03-17: 8 [IU] via SUBCUTANEOUS
  Administered 2023-03-17: 3 [IU] via SUBCUTANEOUS
  Administered 2023-03-18: 5 [IU] via SUBCUTANEOUS

## 2023-03-16 MED ORDER — CARVEDILOL 3.125 MG PO TABS
6.2500 mg | ORAL_TABLET | Freq: Two times a day (BID) | ORAL | Status: DC
  Administered 2023-03-17 – 2023-03-19 (×4): 6.25 mg via ORAL
  Filled 2023-03-16 (×4): qty 2

## 2023-03-16 MED ORDER — POLYETHYLENE GLYCOL 3350 17 G PO PACK
17.0000 g | PACK | Freq: Every day | ORAL | Status: DC
  Administered 2023-03-16 – 2023-03-19 (×3): 17 g via ORAL
  Filled 2023-03-16 (×3): qty 1

## 2023-03-16 MED ORDER — KETOROLAC TROMETHAMINE 15 MG/ML IJ SOLN
15.0000 mg | Freq: Once | INTRAMUSCULAR | Status: AC
Start: 2023-03-16 — End: 2023-03-16
  Administered 2023-03-16: 15 mg via INTRAMUSCULAR
  Filled 2023-03-16: qty 1

## 2023-03-16 MED ORDER — MORPHINE SULFATE (PF) 4 MG/ML IV SOLN
4.0000 mg | Freq: Once | INTRAVENOUS | Status: AC
  Administered 2023-03-16: 4 mg via INTRAVENOUS
  Filled 2023-03-16: qty 1

## 2023-03-16 NOTE — ED Provider Notes (Signed)
[place] EMERGENCY DEPARTMENT AT [facility] Provider Note   CSN: [id number] Arrival date & time: 03/16/23  1914     History {Add pertinent medical, surgical, social history, OB history to HPI:1} Chief Complaint  Patient presents with   Monica Bell is a 62 y.o. female.  62 year old female with past medical history of diabetes, hypertension, and hypothyroidism presenting to the emergency department today with back pain and right hip pain.  The patient states that she had a mechanical fall at home this morning.  She states that she missed the bottom step going down her basement when she went to check on a leak.  She states that she fell on her right hip.  She is having right hip pain as well as pain in her lower back.  She reports that the pain is there when she tries to move.  She denies any weakness, numbness, or tingling.  Denies any bowel or bladder dysfunction or saddle anesthesia.  She denies hitting her head or losing consciousness.  The patient is not on any blood thinners.  She came to the ER today for further evaluation due to ongoing symptoms.   Fall       Home Medications Prior to Admission medications   Medication Sig Start Date End Date Taking? Authorizing Provider  amLODipine (NORVASC) 5 MG tablet Take 1 tablet (5 mg total) by mouth daily. 11/16/22   Patwardhan, Anabel Bene, MD  Calcium Carbonate-Vitamin D (CALTRATE 600+D PO) Take 1 tablet by mouth daily.    [provider]  carvedilol (COREG) 6.25 MG tablet Take 1 tablet (6.25 mg total) by mouth 2 (two) times daily with a meal. 09/16/22   Patwardhan, Manish J, MD  cetirizine (ZYRTEC) 10 MG tablet Take 10 mg by mouth daily.    [provider]  Evolocumab (REPATHA SURECLICK) 140 MG/ML SOAJ Inject 140 mg into the skin every 14 (fourteen) days. 11/16/22   Patwardhan, Anabel Bene, MD  glipiZIDE (GLUCOTROL) 10 MG tablet Take 10 mg by mouth daily before breakfast. 02/24/22   [provider]  GNP ASPIRIN LOW DOSE 81 MG EC tablet TAKE 1 TABLET BY MOUTH ONCE DAILY. SWALLOW WHOLE. Patient taking differently: Take 81 mg by mouth daily. 03/14/21   Patwardhan, Anabel Bene, MD  guaiFENesin (MUCINEX) 600 MG 12 hr tablet Take 2 tablets (1,200 mg total) by mouth 2 (two) times daily. 03/07/23   Carmel Sacramento A, PA-C  insulin aspart (NOVOLOG) 100 UNIT/ML injection Inject 15 Units into the skin 3 (three) times daily before meals.    [provider]  levothyroxine (SYNTHROID) 125 MCG tablet Take 125 mcg by mouth every morning. 05/14/22   [provider]  losartan (COZAAR) 25 MG tablet Take 1 tablet (25 mg total) by mouth daily. 01/02/22   Patwardhan, Anabel Bene, MD  magnesium oxide (MAG-OX) 400 MG tablet Take 400 mg by mouth daily.    [provider]  nicotine (NICODERM CQ - DOSED IN MG/24 HR) 7 mg/24hr patch Place 1 patch (7 mg total) onto the skin daily. 07/23/22   Patwardhan, Anabel Bene, MD  nitroGLYCERIN (NITROSTAT) 0.4 MG SL tablet Place 1 tablet (0.4 mg total) under the tongue every 5 (five) minutes as needed for chest pain. 07/23/22 11/16/22  Patwardhan, Anabel Bene, MD  omeprazole (PRILOSEC) 20 MG capsule Take 20 mg by mouth daily. 04/24/22   [provider]  rosuvastatin (CRESTOR) 40 MG tablet Take 1 tablet (40 mg total) by  mouth daily. Patient not taking: Reported on 11/16/2022 12/08/20   Arrien, York Ram, MD  TRESIBA FLEXTOUCH 200 UNIT/ML FlexTouch Pen Inject 55 Units into the skin daily. 10/18/20   [provider]  vitamin B-12 (CYANOCOBALAMIN) 1000 MCG tablet Take 1,000 mcg by mouth daily.    [provider]      Allergies    Patient has no known allergies.    Review of Systems   Review of Systems  Musculoskeletal:  Positive for back pain.       Right hip pain  All other systems reviewed and are negative.   Physical Exam Updated Vital Signs BP (!) 159/69   Pulse 63   Temp 98.2 F (36.8 C) (Oral)   Ht 5\' 11"  (1.803  m)   Wt 89.4 kg   SpO2 98%   BMI 27.48 kg/m  Physical Exam Vitals and nursing note reviewed.   Gen: NAD Eyes: PERRL, EOMI HEENT: no oropharyngeal swelling Neck: trachea midline, no cervical spinal tenderness noted Resp: clear to auscultation bilaterally Card: RRR, no murmurs, rubs, or gallops Abd: nontender, nondistended Extremities: no calf tenderness, no edema, the patient is tender over the right proximal femur with normal range of motion although moving this does cause pain.  No gross deformity. MSK: The patient is tender over the mid lumbar spine with no step-offs or deformities Vascular: 2+ radial pulses bilaterally, 2+ DP pulses bilaterally Neuro: Equal strength and sensation throughout bilateral lower extremities Skin: no rashes Psyc: acting appropriately   ED Results / Procedures / Treatments   Labs (all labs ordered are listed, but only abnormal results are displayed) Labs Reviewed - No data to display  EKG None  Radiology No results found.  Procedures Procedures  {Document cardiac monitor, telemetry assessment procedure when appropriate:1}  Medications Ordered in ED Medications  ketorolac (TORADOL) 15 MG/ML injection 15 mg (has no administration in time range)    ED Course/ Medical Decision Making/ A&P   {   Click here for ABCD2, HEART and other calculatorsREFRESH Note before signing :1}                              Medical Decision Making 62 year old female with past medical history of diabetes and hypertension presenting to the emergency department today with back and right hip pain after a mechanical fall at home.  She does not have any other injuries here today.  No further evaluate patient here with x-rays of her lumbar spine and right hip for further evaluation for acute fracture.  Will give her a dose of Toradol here.  I have reviewed her most recent GFR and I think that a one-time dose should be okay.  I will reevaluate after her x-rays for ultimate  disposition.  Amount and/or Complexity of Data Reviewed Radiology: ordered.  Risk Prescription drug management.   ***  {Document critical care time when appropriate:1} {Document review of labs and clinical decision tools ie heart score, Chads2Vasc2 etc:1}  {Document your independent review of radiology images, and any outside records:1} {Document your discussion with family members, caretakers, and with consultants:1} {Document social determinants of health affecting pt's care:1} {Document your decision making why or why not admission, treatments were needed:1} Final Clinical Impression(s) / ED Diagnoses Final diagnoses:  None    Rx / DC Orders ED Discharge Orders     None

## 2023-03-16 NOTE — ED Provider Notes (Signed)
  Physical Exam  BP (!) 176/70   Pulse 62   Temp 98.2 F (36.8 C) (Oral)   Resp 16   Ht 5\' 11"  (1.803 m)   Wt 89.4 kg   SpO2 99%   BMI 27.48 kg/m   Physical Exam Vitals and nursing note reviewed.  HENT:     Head: Normocephalic and atraumatic.  Eyes:     Pupils: Pupils are equal, round, and reactive to light.  Cardiovascular:     Rate and Rhythm: Normal rate and regular rhythm.  Pulmonary:     Effort: Pulmonary effort is normal.     Breath sounds: Normal breath sounds.  Abdominal:     Palpations: Abdomen is soft.     Tenderness: There is no abdominal tenderness.  Musculoskeletal:     Comments: Right hip pain to palpation  Skin:    General: Skin is warm and dry.  Neurological:     Mental Status: She is alert.  Psychiatric:        Mood and Affect: Mood normal.     Procedures  Procedures  ED Course / MDM   Clinical Course as of 03/16/23 1712  Tue Mar 16, 2023  1646 Dr.Cairns (orthopedics) has reviewed CT.  Plan for surgery on Thursday morning and admission to [facility].  Paged admitting hospitalist [MP]  1710 Discussed with admitting hospitalist who accepts patient for admission to [facility] [MP]    Clinical Course User Index [MP] Royanne Foots, DO   Medical Decision Making I, Estelle June DO, have assumed care of this patient from the previous provider pending discussion with orthopedics regarding plan for OR and asked facility to admit the patient to and admission  Amount and/or Complexity of Data Reviewed Labs: ordered. Radiology: ordered.  Risk Prescription drug management.   Final diagnosis Right femoral neck fracture Fall, initial encounter       Royanne Foots, DO 03/16/23 1713

## 2023-03-16 NOTE — ED Triage Notes (Signed)
Pt in from home fell on the 3rd of 4th stair on her stairs while walking to the basement, pt fell from standing position, pt c/o R hip pain, pt denies hitting head & LOC, pt does not take blood thinners, no shortening or rotation noted by EMS, A&O x4

## 2023-03-16 NOTE — H&P (Addendum)
History and Physical  Monica Bell:[id number] DOB: 08/24/1960 DOA: 03/16/2023 PCP: [name], NP  Chief Complaint: fall, hip pain Historian: patient, daughter  HPI:  Monica Bell is a 62 y.o. female with a PMH significant for diabetes, hypertension, and hypothyroidism. At baseline, they live at home alone and are completely independent with ADLs.  They presented from home to the ED on 03/16/2023 with fall from 3-4 level stairs to a concrete floor this morning resulting in acute right hip pain.  Immediately following the accident, she was alert and oriented without any loss of consciousness and was able to pull herself to a standing position.  For a short period of time she was able to ambulate and even walked up and down the stairs again after her fall.  She then laid down in her bed and was unable to get back up again.  This is when she called her daughter and EMS.  Denies any prior bone fractures.  She did not take any of her home medications this morning and has not eaten or drank anything today.  In the ED, it was found that they had stable vital signs with the exception of moderate hypertension.  Significant findings included: WBC 16.2, hemoglobin 11, platelets 369.  Na+ 139, K+ 4.1, glucose 167, creatinine 1.22 (at baseline) Lumbar x-ray showing mild degenerative changes but no acute abnormalities Right hip and pelvis x-ray: Mildly displaced proximal right femoral neck fracture CT pelvis completed and read is pending at this time  They were initially treated with analgesia, Zofran. Orthopedic surgery has been consulted and plans to take to the OR Thursday morning.  Patient was admitted to medicine service for further workup and management of right hip fracture as outlined in detail below.  Assessment/Plan Principal Problem:   Hip fracture [facility]) Active Problems:   Closed displaced fracture of right femoral neck (HCC)   Fall   Fall  right closed, mildly  displaced fracture of femoral neck-CT scan pending -Ortho surgery consulted by EDP.  OR planned 11/21 -Analgesia as needed, as well as bowel regiment for constipation prevention -Nonweightbearing to right leg -PT postop  Type 2 diabetes mellitus-most recent hemoglobin A1c was 11.1 in 2023.  EF 55 to 60% 07/2022 -Sliding scale insulin, and meal coverage -Hemoglobin A1c  HTN-poorly controlled in setting of acute pain and has not taken home medications today -Restart home medications: Losartan  Hypothyroidism -Continue home levothyroxine  CAD  history of STEMI  HFrEF-denies current chest pain -Nitroglycerin as needed -statin intolerance -Continue carvedilol, aspirin  Nicotine dependence -Nicotine replacement as needed  Past Medical History:  Diagnosis Date   Abnormal mammogram    Allergic rhinitis due to pollen 08/01/2019   Arthritis 01/06/2019   osteoarthritis of both knees   BMI 28.0-28.9,adult 08/01/2019   Cystocele, unspecified (CODE)    Diabetes mellitus    GERD (gastroesophageal reflux disease)    Heart murmur    Hyperlipemia, mixed 12/14/2018   Hypertension    Hypothyroidism    Stress incontinence, female    Thyroid disease    Type 2 diabetes mellitus, without long-term current use of insulin (HCC) 01/06/2019   Vitamin D deficiency 01/06/2019    Past Surgical History:  Procedure Laterality Date   COLONOSCOPY WITH PROPOFOL N/A 03/18/2020   Procedure: COLONOSCOPY WITH PROPOFOL;  Surgeon: Regis Bill, MD;  Location: [facility] ENDOSCOPY;  Service: Endoscopy;  Laterality: N/A;   CORONARY BALLOON ANGIOPLASTY N/A 07/15/2021   Procedure: CORONARY BALLOON ANGIOPLASTY;  Surgeon:  Patwardhan, Anabel Bene, MD;  Location: [facility];  Service: Cardiovascular;  Laterality: N/A;   CORONARY STENT INTERVENTION N/A 12/06/2020   Procedure: CORONARY STENT INTERVENTION;  Surgeon: Elder Negus, MD;  Location: [facility];  Service: Cardiovascular;  Laterality:  N/A;   CORONARY STENT INTERVENTION N/A 07/15/2021   Procedure: CORONARY STENT INTERVENTION;  Surgeon: Elder Negus, MD;  Location: [facility];  Service: Cardiovascular;  Laterality: N/A;   CORONARY ULTRASOUND/IVUS N/A 12/06/2020   Procedure: Intravascular Ultrasound/IVUS;  Surgeon: Elder Negus, MD;  Location: [facility];  Service: Cardiovascular;  Laterality: N/A;   CORONARY ULTRASOUND/IVUS N/A 07/15/2021   Procedure: Intravascular Ultrasound/IVUS;  Surgeon: Elder Negus, MD;  Location: [facility];  Service: Cardiovascular;  Laterality: N/A;   LEFT HEART CATH AND CORONARY ANGIOGRAPHY N/A 12/06/2020   Procedure: LEFT HEART CATH AND CORONARY ANGIOGRAPHY;  Surgeon: Elder Negus, MD;  [facility]: [facility];  Service: Cardiovascular;  Laterality: N/A;   LEFT HEART CATH AND CORONARY ANGIOGRAPHY N/A 07/15/2021   Procedure: LEFT HEART CATH AND CORONARY ANGIOGRAPHY;  Surgeon: Elder Negus, MD;  Location: [facility];  Service: Cardiovascular;  Laterality: N/A;   ROTATOR CUFF REPAIR     THYROIDECTOMY       reports that she has been smoking cigarettes. She started smoking about 17 years ago. She has a 8.7 pack-year smoking history. She uses smokeless tobacco. She reports that she does not drink alcohol and does not use drugs.  No Known Allergies  Family History  Problem Relation Age of Onset   Diabetes Mother    Cancer Mother    COPD Father    Hypertension Father    Hypertension Sister    Hypertension Sister    Hypertension Sister    Hypertension Brother    Hypertension Brother    Hypertension Brother    Hypertension Brother    Hypertension Brother    Stroke Brother    Cancer Brother     Prior to Admission medications   Medication Sig Start Date End Date Taking? Authorizing Provider  amLODipine (NORVASC) 5 MG tablet Take 1 tablet (5 mg total) by mouth daily. 11/16/22   Patwardhan, Anabel Bene, MD  Calcium  Carbonate-Vitamin D (CALTRATE 600+D PO) Take 1 tablet by mouth daily.    [provider]  carvedilol (COREG) 6.25 MG tablet Take 1 tablet (6.25 mg total) by mouth 2 (two) times daily with a meal. 09/16/22   Patwardhan, Manish J, MD  cetirizine (ZYRTEC) 10 MG tablet Take 10 mg by mouth daily.    [provider]  Evolocumab (REPATHA SURECLICK) 140 MG/ML SOAJ Inject 140 mg into the skin every 14 (fourteen) days. 11/16/22   Patwardhan, Anabel Bene, MD  glipiZIDE (GLUCOTROL) 10 MG tablet Take 10 mg by mouth daily before breakfast. 02/24/22   [provider]  GNP ASPIRIN LOW DOSE 81 MG EC tablet TAKE 1 TABLET BY MOUTH ONCE DAILY. SWALLOW WHOLE. Patient taking differently: Take 81 mg by mouth daily. 03/14/21   Patwardhan, Anabel Bene, MD  guaiFENesin (MUCINEX) 600 MG 12 hr tablet Take 2 tablets (1,200 mg total) by mouth 2 (two) times daily. 03/07/23   Carmel Sacramento A, PA-C  insulin aspart (NOVOLOG) 100 UNIT/ML injection Inject 15 Units into the skin 3 (three) times daily before meals.    [provider]  levothyroxine (SYNTHROID) 125 MCG tablet Take 125 mcg by mouth every morning. 05/14/22  [provider]  losartan (COZAAR) 25 MG tablet Take 1 tablet (25 mg total) by mouth daily. 01/02/22   Patwardhan, Anabel Bene, MD  magnesium oxide (MAG-OX) 400 MG tablet Take 400 mg by mouth daily.    [provider]  nicotine (NICODERM CQ - DOSED IN MG/24 HR) 7 mg/24hr patch Place 1 patch (7 mg total) onto the skin daily. 07/23/22   Patwardhan, Anabel Bene, MD  nitroGLYCERIN (NITROSTAT) 0.4 MG SL tablet Place 1 tablet (0.4 mg total) under the tongue every 5 (five) minutes as needed for chest pain. 07/23/22 11/16/22  Patwardhan, Anabel Bene, MD  omeprazole (PRILOSEC) 20 MG capsule Take 20 mg by mouth daily. 04/24/22   [provider]  rosuvastatin (CRESTOR) 40 MG tablet Take 1 tablet (40 mg total) by mouth daily. Patient not taking: Reported on 11/16/2022 12/08/20   Arrien,  York Ram, MD  TRESIBA FLEXTOUCH 200 UNIT/ML FlexTouch Pen Inject 55 Units into the skin daily. 10/18/20   [provider]  vitamin B-12 (CYANOCOBALAMIN) 1000 MCG tablet Take 1,000 mcg by mouth daily.    [provider]   I have personally, briefly reviewed patient's prior medical records in [place] [facility]  Objective: Blood pressure (!) 176/70, pulse 62, temperature 98.2 F (36.8 C), temperature source Oral, resp. rate 16, height 5\' 11"  (1.803 m), weight 89.4 kg, SpO2 99%.   Constitutional: NAD, calm, comfortable HEENT: lids and conjunctivae normal. MMM.  Neck: normal, supple, no masses, no thyromegaly Respiratory: CTAB, no wheezing, no crackles. Normal respiratory effort. No accessory muscle use.  Cardiovascular: RRR, no murmurs / rubs / gallops. No extremity edema. 2+ pedal pulses. no clubbing / cyanosis.  Musculoskeletal: No joint deformity upper and lower extremities.  Tenderness to right hip, groin.  No edema, ecchymosis appreciated Skin: dry, intact, normal color, normal temperature on exposed skin Neurologic: Alert and oriented x 3. Normal speech. Grossly non-focal exam. PERRL Psychiatric: Normal mood. Congruent affect.  Labs on Admission: I have personally reviewed admission labs and imaging studies  CBC    Component Value Date/Time   WBC 16.2 (H) 03/16/2023 1315   RBC 4.69 03/16/2023 1315   HGB 11.0 (L) 03/16/2023 1315   HCT 35.7 (L) 03/16/2023 1315   PLT 369 03/16/2023 1315   MCV 76.1 (L) 03/16/2023 1315   MCH 23.5 (L) 03/16/2023 1315   MCHC 30.8 03/16/2023 1315   RDW 18.7 (H) 03/16/2023 1315   LYMPHSABS 3.8 03/07/2023 1104   MONOABS 1.2 (H) 03/07/2023 1104   EOSABS 0.4 03/07/2023 1104   BASOSABS 0.1 03/07/2023 1104   CMP     Component Value Date/Time   NA 139 03/16/2023 1315   NA 139 01/02/2021 1346   K 4.1 03/16/2023 1315   CL 107 03/16/2023 1315   CO2 26 03/16/2023 1315   GLUCOSE 167 (H) 03/16/2023 1315   BUN 10 03/16/2023 1315    BUN 9 01/02/2021 1346   CREATININE 1.22 (H) 03/16/2023 1315   CALCIUM 8.7 (L) 03/16/2023 1315   GFRNONAA 50 (L) 03/16/2023 1315   GFRAA >90 07/22/2013 2322    Radiological Exams on Admission: DG Lumbar Spine Complete  Result Date: 03/16/2023 CLINICAL DATA:  Lower back pain after fall today. EXAM: LUMBAR SPINE - COMPLETE 4+ VIEW COMPARISON:  None Available. FINDINGS: There is no evidence of lumbar spine fracture. Alignment is normal. Intervertebral disc spaces are maintained. Mild anterior osteophyte formation is noted at L2-3 and L4-5. IMPRESSION: Mild multilevel degenerative changes.  No acute abnormality seen. Electronically  Signed   By: Lupita Raider M.D.   On: 03/16/2023 12:36   DG Hip Unilat W or Wo Pelvis 2-3 Views Right  Result Date: 03/16/2023 CLINICAL DATA:  Right hip pain after fall today. EXAM: DG HIP (WITH OR WITHOUT PELVIS) 2-3V RIGHT COMPARISON:  None Available. FINDINGS: Mildly displaced right proximal femoral neck fracture is noted. IMPRESSION: Mildly displaced proximal right femoral neck fracture. Electronically Signed   By: Lupita Raider M.D.   On: 03/16/2023 12:34    EKG: Independently reviewed. NSR  DVT prophylaxis: enoxaparin (LOVENOX) injection 40 mg Start: 03/16/23 1745  Code Status: full  Family Communication: daughter and grand children at bedside  Disposition Plan: med-surge pending surgical fixation 11/21  Consults called: ortho surgery    Leeroy Bock, DO [facility]  03/16/2023, 7:11 PM    To contact the appropriate [facility] Attending or Consulting provider: Check [email or web address] for coverage from 7pm-7am

## 2023-03-17 DIAGNOSIS — I10 Essential (primary) hypertension: Secondary | ICD-10-CM

## 2023-03-17 DIAGNOSIS — S72001A Fracture of unspecified part of neck of right femur, initial encounter for closed fracture: Secondary | ICD-10-CM | POA: Diagnosis not present

## 2023-03-17 DIAGNOSIS — W108XXA Fall (on) (from) other stairs and steps, initial encounter: Secondary | ICD-10-CM

## 2023-03-17 LAB — CBC
HCT: 35.6 % — ABNORMAL LOW (ref 36.0–46.0)
Hemoglobin: 10.8 g/dL — ABNORMAL LOW (ref 12.0–15.0)
MCH: 22.9 pg — ABNORMAL LOW (ref 26.0–34.0)
MCHC: 30.3 g/dL (ref 30.0–36.0)
MCV: 75.6 fL — ABNORMAL LOW (ref 80.0–100.0)
Platelets: 373 10*3/uL (ref 150–400)
RBC: 4.71 MIL/uL (ref 3.87–5.11)
RDW: 18.5 % — ABNORMAL HIGH (ref 11.5–15.5)
WBC: 16.3 10*3/uL — ABNORMAL HIGH (ref 4.0–10.5)
nRBC: 0 % (ref 0.0–0.2)

## 2023-03-17 LAB — BASIC METABOLIC PANEL
Anion gap: 9 (ref 5–15)
BUN: 13 mg/dL (ref 8–23)
CO2: 24 mmol/L (ref 22–32)
Calcium: 8.6 mg/dL — ABNORMAL LOW (ref 8.9–10.3)
Chloride: 103 mmol/L (ref 98–111)
Creatinine, Ser: 1.19 mg/dL — ABNORMAL HIGH (ref 0.44–1.00)
GFR, Estimated: 52 mL/min — ABNORMAL LOW (ref >60)
Glucose, Bld: 153 mg/dL — ABNORMAL HIGH (ref 70–99)
Potassium: 4.1 mmol/L (ref 3.5–5.1)
Sodium: 136 mmol/L (ref 135–145)

## 2023-03-17 LAB — GLUCOSE, CAPILLARY
Glucose-Capillary: 174 mg/dL — ABNORMAL HIGH (ref 70–99)
Glucose-Capillary: 174 mg/dL — ABNORMAL HIGH (ref 70–99)
Glucose-Capillary: 91 mg/dL (ref 70–99)
Glucose-Capillary: 191 mg/dL — ABNORMAL HIGH (ref 70–99)

## 2023-03-17 LAB — HIV ANTIBODY (ROUTINE TESTING W REFLEX): HIV Screen 4th Generation wRfx: NONREACTIVE

## 2023-03-17 MED ORDER — HYDRALAZINE HCL 20 MG/ML IJ SOLN: 10.0000 mg | INTRAMUSCULAR | Status: DC | PRN

## 2023-03-17 MED ORDER — ONDANSETRON HCL 4 MG/2ML IJ SOLN
4.0000 mg | Freq: Four times a day (QID) | INTRAMUSCULAR | Status: DC | PRN
  Administered 2023-03-17: 4 mg via INTRAVENOUS
  Filled 2023-03-17: qty 2

## 2023-03-17 MED ORDER — CEFAZOLIN SODIUM-DEXTROSE 2-4 GM/100ML-% IV SOLN
2.0000 g | INTRAVENOUS | Status: AC
  Administered 2023-03-18: 2 g via INTRAVENOUS
  Filled 2023-03-17: qty 100

## 2023-03-17 MED ORDER — TRANEXAMIC ACID-NACL 1000-0.7 MG/100ML-% IV SOLN
1000.0000 mg | INTRAVENOUS | Status: AC
  Administered 2023-03-18: 1000 mg via INTRAVENOUS
  Filled 2023-03-17: qty 100

## 2023-03-17 NOTE — Inpatient Diabetes Management (Addendum)
Inpatient Diabetes Program Recommendations  AACE/ADA: New Consensus Statement on Inpatient Glycemic Control (2015)  Target Ranges:  Prepandial:   less than 140 mg/dL      Peak postprandial:   less than 180 mg/dL (1-2 hours)      Critically ill patients:  140 - 180 mg/dL    Review of Glycemic Control  Latest Reference Range & Units 03/17/23 07:42  Glucose-Capillary 70 - 99 mg/dL 213 (H)  (H): Data is abnormally high Diabetes history: Type 2 DM Outpatient Diabetes medications: Novolog 15 units TID, Glipizide 10 mg every day, Tresiba 55 units QD Current orders for Inpatient glycemic control: Novolog 0-15 units TID, Novolog 15 units TID  Inpatient Diabetes Program Recommendations:    Consider: -Discontinuing Novolog 15 units TID -Adding Semglee 12 units every day  Reviewed patient's current A1c of 10.4%. Explained what a A1c is and what it measures. Also reviewed goal A1c with patient, importance of good glucose control @ home, and blood sugar goals. Reviewed patho of DM, need for improvement, risk of hypoglycemia, survival skills, interventions, vascular changes, impact of wound healing, risk for infection and commorbidities. Patient has a CGM but cannot remember the name of it nor has she "hooked it up yet". Plans to do it when she gets home.  Has a meter and other testing supplies.  Feels that she experiences lows frequently around noon. Encouraged to place sensor in order to capture trends and inform PCP. Patient does have appointment scheduled.  Denies drinking sugary beverages. No further questions at this time.    Thanks, Lujean Rave, MSN, RNC-OB Diabetes Coordinator [phone] (8a-5p)

## 2023-03-17 NOTE — Hospital Course (Addendum)
62 y.o. female with a PMH significant for diabetes, hypertension, and hypothyroidism.  At baseline, they live at home alone and are completely independent with ADLs.   They presented from home to the ED on 03/16/2023 with fall from 3-4 level stairs to a concrete floor this morning resulting in acute right hip pain.  Immediately following the accident, she was alert and oriented without any loss of consciousness and was able to pull herself to a standing position.  For a short period of time she was able to ambulate and even walked up and down the stairs again after her fall.  She then laid down in her bed and was unable to get back up again.  This is when she called her daughter and EMS.  Denies any prior bone fractures.  She did not take any of her home medications this morning and has not eaten or drank anything today.   In the ED, it was found that they had stable vital signs with the exception of moderate hypertension.  Significant findings included: WBC 16.2, hemoglobin 11, platelets 369.  Na+ 139, K+ 4.1, glucose 167, creatinine 1.22 (at baseline).  Lumbar x-ray showing mild degenerative changes but no acute abnormalities Right hip and pelvis x-ray: Mildly displaced proximal right femoral neck fracture.  CT pelvis completed and read is pending at this time   They were initially treated with analgesia, Zofran.  Orthopedic surgeon [name] has been consulted and plans to take to the OR 03/18/23.    Patient was admitted to medicine service for further workup and management of right hip fracture as outlined in detail below.

## 2023-03-17 NOTE — Progress Notes (Signed)
PROGRESS NOTE   Monica Bell  CZY:[id number] DOB: [birth year] DOA: 03/16/2023 PCP: [name], NP   Chief Complaint  Patient presents with   Fall   Level of care: Med-Surg  Brief Admission History:  62 y.o. female with a PMH significant for diabetes, hypertension, and hypothyroidism.  At baseline, they live at home alone and are completely independent with ADLs.   They presented from home to the ED on 03/16/2023 with fall from 3-4 level stairs to a concrete floor this morning resulting in acute right hip pain.  Immediately following the accident, she was alert and oriented without any loss of consciousness and was able to pull herself to a standing position.  For a short period of time she was able to ambulate and even walked up and down the stairs again after her fall.  She then laid down in her bed and was unable to get back up again.  This is when she called her daughter and EMS.  Denies any prior bone fractures.  She did not take any of her home medications this morning and has not eaten or drank anything today.   In the ED, it was found that they had stable vital signs with the exception of moderate hypertension.  Significant findings included: WBC 16.2, hemoglobin 11, platelets 369.  Na+ 139, K+ 4.1, glucose 167, creatinine 1.22 (at baseline).  Lumbar x-ray showing mild degenerative changes but no acute abnormalities Right hip and pelvis x-ray: Mildly displaced proximal right femoral neck fracture.  CT pelvis completed and read is pending at this time   They were initially treated with analgesia, Zofran.  Orthopedic surgeon [name] has been consulted and plans to take to the OR 03/18/23.    Patient was admitted to medicine service for further workup and management of right hip fracture as outlined in detail below.     Assessment and Plan:  Fall  right closed, mildly displaced fracture of femoral neck-CT scan pending -Ortho surgery consulted by EDP.  OR planned  11/21 -Analgesia as needed, as well as bowel regiment for constipation prevention -Nonweightbearing to right leg -PT postop -NPO after midnight   Uncontrolled Type 2 diabetes mellitus-most recent hemoglobin A1c was 11.1 in 2023.  EF 55 to 60% 07/2022 -Sliding scale insulin for now due to unpredictable eating  -Hemoglobin A1c - 10.4%    HTN-poorly controlled in setting of acute pain and has not taken home medications today -Restart home medications: Losartan -added hydralazine IV PRN SBP>150   Hypothyroidism -Continue home levothyroxine   CAD  history of STEMI  HFrEF-denies current chest pain -Nitroglycerin as needed -statin intolerance -Continue carvedilol, aspirin   Nicotine dependence -Nicotine replacement as needed  DVT prophylaxis: (hold chemical DVT prophylaxis for surgery 11/21) Code Status: Full  Family Communication:  Disposition: TBD    Consultants:  Dr. Dallas Schimke orthopedics  Procedures:  Planned hip repair for 11/21  Antimicrobials:    Subjective: Pt reports pain better controlled.   Objective: Vitals:   03/16/23 1800 03/16/23 1837 03/17/23 0734 03/17/23 1343  BP: (!) 147/59 (!) 166/64 (!) 145/57 (!) 142/61  Pulse: 70 69 65 72  Resp: 17 19 16 18   Temp:  98.6 F (37 C) 98.1 F (36.7 C) 98.4 F (36.9 C)  TempSrc:  Oral Oral Oral  SpO2: 97% 100% 98% 100%  Weight:      Height:        Intake/Output Summary (Last 24 hours) at 03/17/2023 1751 Last data filed at 03/17/2023  1253 Gross per 24 hour  Intake 720 ml  Output --  Net 720 ml   Filed Weights   03/16/23 0759  Weight: 89.4 kg   Examination:  General exam: Appears calm and comfortable  Respiratory system: Clear to auscultation. Respiratory effort normal. Cardiovascular system: normal S1 & S2 heard. No JVD, murmurs, rubs, gallops or clicks. No pedal edema. Gastrointestinal system: Abdomen is nondistended, soft and nontender. No organomegaly or masses felt. Normal bowel sounds heard. Central  nervous system: Alert and oriented. No focal neurological deficits. Extremities: right leg externally rotated, warm extremities bilateral with pedal pulses palpated bilateral. Skin: No rashes, lesions or ulcers. Psychiatry: Judgement and insight appear normal. Mood & affect appropriate.   Data Reviewed: I have personally reviewed following labs and imaging studies  CBC: Recent Labs  Lab 03/16/23 1315 03/17/23 0416  WBC 16.2* 16.3*  HGB 11.0* 10.8*  HCT 35.7* 35.6*  MCV 76.1* 75.6*  PLT 369 373    Basic Metabolic Panel: Recent Labs  Lab 03/16/23 1315 03/17/23 0416  NA 139 136  K 4.1 4.1  CL 107 103  CO2 26 24  GLUCOSE 167* 153*  BUN 10 13  CREATININE 1.22* 1.19*  CALCIUM 8.7* 8.6*    CBG: Recent Labs  Lab 03/17/23 0742 03/17/23 1110 03/17/23 1705  GLUCAP 174* 174* 91    No results found for this or any previous visit (from the past 240 hour(s)).   Radiology Studies: CT PELVIS WO CONTRAST  Result Date: 03/16/2023 CLINICAL DATA:  Hip trauma EXAM: CT PELVIS WITHOUT CONTRAST TECHNIQUE: Multidetector CT imaging of the pelvis was performed following the standard protocol without intravenous contrast. RADIATION DOSE REDUCTION: This exam was performed according to the departmental dose-optimization program which includes automated exposure control, adjustment of the mA and/or kV according to patient size and/or use of iterative reconstruction technique. COMPARISON:  None Available. FINDINGS: Urinary Tract:  No abnormality visualized. Bowel:  Unremarkable visualized pelvic bowel loops. Vascular/Lymphatic: There are atherosclerotic calcifications present. No acute abnormality. No enlarged lymph nodes. Reproductive:  Ovaries and uterus are within normal limits. Other: There is a small fat containing umbilical hernia. There is no ascites. Musculoskeletal: There is a mildly impacted right femoral neck fracture. There is no dislocation. Mild degenerative changes affect the hips.  There is mild soft tissue swelling lateral to the right hip. IMPRESSION: Mildly impacted right femoral neck fracture. Electronically Signed   By: Darliss Cheney M.D.   On: 03/16/2023 19:25   DG Lumbar Spine Complete  Result Date: 03/16/2023 CLINICAL DATA:  Lower back pain after fall today. EXAM: LUMBAR SPINE - COMPLETE 4+ VIEW COMPARISON:  None Available. FINDINGS: There is no evidence of lumbar spine fracture. Alignment is normal. Intervertebral disc spaces are maintained. Mild anterior osteophyte formation is noted at L2-3 and L4-5. IMPRESSION: Mild multilevel degenerative changes.  No acute abnormality seen. Electronically Signed   By: Lupita Raider M.D.   On: 03/16/2023 12:36   DG Hip Unilat W or Wo Pelvis 2-3 Views Right  Result Date: 03/16/2023 CLINICAL DATA:  Right hip pain after fall today. EXAM: DG HIP (WITH OR WITHOUT PELVIS) 2-3V RIGHT COMPARISON:  None Available. FINDINGS: Mildly displaced right proximal femoral neck fracture is noted. IMPRESSION: Mildly displaced proximal right femoral neck fracture. Electronically Signed   By: Lupita Raider M.D.   On: 03/16/2023 12:34    Scheduled Meds:  aspirin EC  81 mg Oral Daily   carvedilol  6.25 mg Oral BID WC  insulin aspart  0-15 Units Subcutaneous TID WC   levothyroxine  125 mcg Oral q morning   losartan  25 mg Oral Daily   polyethylene glycol  17 g Oral Daily   Continuous Infusions:  [START ON 03/18/2023]  ceFAZolin (ANCEF) IV     [START ON 03/18/2023] tranexamic acid       LOS: 1 day   Time spent: 50 mins  Karalee Hauter Laural Benes, MD How to contact the [facility] Attending or Consulting provider 7A - 7P or covering provider during after hours 7P -7A, for this patient?  Check the care team in [facility] and look for a) attending/consulting [facility] provider listed and b) the [facility] team listed Log into [email or web address] to find provider on call.  Locate the [facility] provider you are looking for under [facility] and page to a number that you can be  directly reached. If you still have difficulty reaching the provider, please page the [facility] (Director on Call) for the Hospitalists listed on amion for assistance.  03/17/2023, 5:51 PM

## 2023-03-17 NOTE — Progress Notes (Deleted)
Transition of Care Department [facility]) has reviewed patient and no TOC needs have been identified at this time. We will continue to monitor patient advancement through interdisciplinary progression rounds. If new patient transition needs arise, please place a TOC consult.   03/17/23 0811  TOC Brief Assessment  Insurance and Status Reviewed  Patient has primary care physician Yes  Home environment has been reviewed Lives alone.  Prior level of function: Independent.  Prior/Current Home Services No current home services  Social Determinants of Health Reivew SDOH reviewed no interventions necessary  Readmission risk has been reviewed Yes  Transition of care needs no transition of care needs at this time

## 2023-03-17 NOTE — Plan of Care (Signed)
  Problem: Education: Goal: Knowledge of General Education information will improve Description: Including pain rating scale, medication(s)/side effects and non-pharmacologic comfort measures Outcome: Progressing   Problem: Health Behavior/Discharge Planning: Goal: Ability to manage health-related needs will improve Outcome: Progressing   Problem: Clinical Measurements: Goal: Ability to maintain clinical measurements within normal limits will improve Outcome: Progressing   Problem: Activity: Goal: Risk for activity intolerance will decrease Outcome: Not Met (add Reason)   Problem: Nutrition: Goal: Adequate nutrition will be maintained Outcome: Progressing

## 2023-03-17 NOTE — TOC Initial Note (Signed)
Transition of Care [facility]) - Initial/Assessment Note    Patient Details  Name: Monica Bell MRN: [id number] Date of Birth: [birth year]  Transition of Care [facility]) CM/SW Contact:    Karn Cassis, LCSW Phone Number: 03/17/2023, 10:01 AM  Clinical Narrative:  Pt admitted due to hip fracture. Pt reports she lives alone and is independent with ADLs at baseline. Surgery scheduled for tomorrow. TOC will discuss d/c plan with pt following PT recommendations.                  Expected Discharge Plan:  (to be determined after surgery) Barriers to Discharge: Continued Medical Work up   Patient Goals and CMS Choice Patient states their goals for this hospitalization and ongoing recovery are:: will discuss after surgery          Expected Discharge Plan and Services In-house Referral: Clinical Social Work     Living arrangements for the past 2 months: Single Family Home                                      Prior Living Arrangements/Services Living arrangements for the past 2 months: Single Family Home Lives with:: Self Patient language and need for interpreter reviewed:: Yes Do you feel safe going back to the place where you live?: Yes      Need for Family Participation in Patient Care: No (Comment)     Criminal Activity/Legal Involvement Pertinent to Current Situation/Hospitalization: No - Comment as needed  Activities of Daily Living   ADL Screening (condition at time of admission) Independently performs ADLs?: No Does the patient have a NEW difficulty with bathing/dressing/toileting/self-feeding that is expected to last >3 days?: Yes (Initiates electronic notice to provider for possible OT consult) Does the patient have a NEW difficulty with getting in/out of bed, walking, or climbing stairs that is expected to last >3 days?: Yes (Initiates electronic notice to provider for possible PT consult) Does the patient have a NEW difficulty with communication that  is expected to last >3 days?: No Is the patient deaf or have difficulty hearing?: No Does the patient have difficulty seeing, even when wearing glasses/contacts?: No Does the patient have difficulty concentrating, remembering, or making decisions?: No  Permission Sought/Granted                  Emotional Assessment     Affect (typically observed): Appropriate Orientation: : Oriented to Self, Oriented to Place, Oriented to  Time, Oriented to Situation Alcohol / Substance Use: Not Applicable Psych Involvement: No (comment)  Admission diagnosis:  Hip fracture (HCC) [S72.009A] Fall, initial encounter [W19.XXXA] Closed displaced fracture of right femoral neck (HCC) [S72.001A] Patient Active Problem List   Diagnosis Date Noted   Hip fracture (HCC) 03/16/2023   Closed displaced fracture of right femoral neck (HCC) 03/16/2023   Fall 03/16/2023   Statin myopathy 11/16/2022   Pain in lower limb 10/12/2022   Plantar fasciitis 10/12/2022   Posterior calcaneal exostosis 10/12/2022   Cigarette nicotine dependence without complication 07/23/2022   Post PTCA 07/15/2021   Refractory angina (HCC)    Research subject EMPACT AMI Study: EMPAgliflozin in CHF patients post acute MI 07/03/2021   Primary hypertension 01/23/2021   Uncontrolled type 2 diabetes mellitus with hyperglycemia (HCC) 01/23/2021   Coronary artery disease of native artery of native heart with stable angina pectoris (HCC) 12/10/2020   HFrEF (heart failure with reduced ejection  fraction) (HCC) 12/10/2020   Chest pain 12/06/2020   Mild renal insufficiency 12/06/2020   Hypertensive crisis 12/06/2020   NSTEMI (non-ST elevated myocardial infarction) (HCC) 12/06/2020   Abnormal mammogram 05/17/2019   Diabetes mellitus type 2, insulin dependent (HCC) 05/17/2019   GERD (gastroesophageal reflux disease) 05/17/2019   Heart murmur 05/17/2019   Mixed hyperlipidemia 05/17/2019   Chronic right shoulder pain 02/26/2012   Cystocele  02/26/2012   Stress incontinence, female 02/26/2012   Hypothyroidism 02/26/2012   Tobacco use disorder 02/26/2012   PCP:  [name], NP Pharmacy:   [facility] - [place], [place] - [address] [place] [address] Phone: [phone] Fax: [phone]  [facility] [address] [place] [address] Phone: [phone] Fax: [phone]  [facility] [address], [place] - [address] [place] [address] [place] [address] [place] [address] Phone: [phone] Fax: [phone]  [id number] - [place] - [address] AT [facility] [address]  [place] [address] Phone: [phone] Fax: [phone]     Social Determinants of Health (SDOH) Social History: SDOH Screenings   Food Insecurity: No Food Insecurity (03/16/2023)  Housing: Low Risk  (03/16/2023)  Transportation Needs: No Transportation Needs (03/16/2023)  Utilities: Not At Risk (03/16/2023)  Depression (PHQ2-9): Low Risk  (09/03/2021)  Financial Resource Strain: Low Risk  (03/11/2023)   Received from [facility]  Tobacco Use: High Risk (03/16/2023)   SDOH Interventions:     Readmission Risk Interventions     No data to display

## 2023-03-17 NOTE — Consult Note (Signed)
ORTHOPAEDIC CONSULTATION  REQUESTING PHYSICIAN: Cleora Fleet, MD  ASSESSMENT AND PLAN: 62 y.o. female with the following: Right Hip Valgus impacted femoral neck fracture, minimally displaced  This patient requires inpatient admission to the hospitalist, to include preoperative clearance and perioperative medical management  - Weight Bearing Status/Activity: NWB Right lower extremity  - Additional recommended labs/tests: Preop Labs: CBC, BMP, PT/INR, Chest XR, and EKG  -VTE Prophylaxis: Please hold prior to OR; to resume POD#1 at the discretion of the primary team  - Pain control: Recommend PO pain medications PRN; judicious use of narcotics  - Follow-up plan: F/u 10-14 days postop  -Procedures: Plan for OR once patient has been medically optimized  -Careful review of available radiographs, as well as a CT scan demonstrates a minimally displaced right femoral neck fracture.  There is some impaction.  After review of the imaging, and discussion with colleagues, this fracture pattern is amenable to percutaneous pinning.  This was discussed with the patient.  There are inherent risks with this surgery, and she is aware that this may another surgery, if this does not heal as expected.  Plan for Right hip CRPP  Plan for surgery 03/18/2023.  N.p.o. at midnight.     Chief Complaint: Right hip pain  HPI: Monica Bell is a 62 y.o. female who presented to the ED for evaluation after sustaining a mechanical fall.  She states she was going down to her basement yesterday, as there was a water leak.  She slipped and fell down 3-4 stairs.  She landed directly onto her right hip.  She had immediate pain.  She was able to get herself up.  She was able to ambulate in order to turn off her water.  She returned upstairs, while ambulating and realized that she had to turn off the circuit breaker.  She then went back downstairs, and was able to turn off the circuit breaker.  She then laid  down.  When she attempted to get up later, she had pain, and was unable to walk.  She called her daughter, and EMS brought her to the hospital.  No pain elsewhere.  No prior issues with the right hip.  She did not hit her head.  Past Medical History:  Diagnosis Date   Abnormal mammogram    Allergic rhinitis due to pollen 08/01/2019   Arthritis 01/06/2019   osteoarthritis of both knees   BMI 28.0-28.9,adult 08/01/2019   Cystocele, unspecified (CODE)    Diabetes mellitus    GERD (gastroesophageal reflux disease)    Heart murmur    Hyperlipemia, mixed 12/14/2018   Hypertension    Hypothyroidism    Stress incontinence, female    Thyroid disease    Type 2 diabetes mellitus, without long-term current use of insulin (HCC) 01/06/2019   Vitamin D deficiency 01/06/2019   Past Surgical History:  Procedure Laterality Date   COLONOSCOPY WITH PROPOFOL N/A 03/18/2020   Procedure: COLONOSCOPY WITH PROPOFOL;  Surgeon: Regis Bill, MD;  Location: [facility] ENDOSCOPY;  Service: Endoscopy;  Laterality: N/A;   CORONARY BALLOON ANGIOPLASTY N/A 07/15/2021   Procedure: CORONARY BALLOON ANGIOPLASTY;  Surgeon: Elder Negus, MD;  Location: [facility]VAS[facility] CV [facility];  Service: Cardiovascular;  Laterality: N/A;   CORONARY STENT INTERVENTION N/A 12/06/2020   Procedure: CORONARY STENT INTERVENTION;  Surgeon: Elder Negus, MD;  Location: [facility] CV [facility];  Service: Cardiovascular;  Laterality: N/A;   CORONARY STENT INTERVENTION N/A 07/15/2021   Procedure: CORONARY STENT INTERVENTION;  Surgeon:  Patwardhan, Anabel Bene, MD;  Location: [facility];  Service: Cardiovascular;  Laterality: N/A;   CORONARY ULTRASOUND/IVUS N/A 12/06/2020   Procedure: Intravascular Ultrasound/IVUS;  Surgeon: Elder Negus, MD;  Location: [facility];  Service: Cardiovascular;  Laterality: N/A;   CORONARY ULTRASOUND/IVUS N/A 07/15/2021   Procedure: Intravascular Ultrasound/IVUS;  Surgeon: Elder Negus, MD;   Location: [facility];  Service: Cardiovascular;  Laterality: N/A;   LEFT HEART CATH AND CORONARY ANGIOGRAPHY N/A 12/06/2020   Procedure: LEFT HEART CATH AND CORONARY ANGIOGRAPHY;  Surgeon: Elder Negus, MD;  [facility]: [facility];  Service: Cardiovascular;  Laterality: N/A;   LEFT HEART CATH AND CORONARY ANGIOGRAPHY N/A 07/15/2021   Procedure: LEFT HEART CATH AND CORONARY ANGIOGRAPHY;  Surgeon: Elder Negus, MD;  Location: [facility];  Service: Cardiovascular;  Laterality: N/A;   ROTATOR CUFF REPAIR     THYROIDECTOMY     Social History   Socioeconomic History   Marital status: Single    Spouse name: Not on file   Number of children: 1   Years of education: Not on file   Highest education level: Not on file  Occupational History   Not on file  Tobacco Use   Smoking status: Every Day    Current packs/day: 0.50    Average packs/day: 0.5 packs/day for 17.3 years (8.7 ttl pk-yrs)    Types: Cigarettes    Start date: 11/2005   Smokeless tobacco: Current   Tobacco comments:    Quite: 11/2020 and restarted: 04/2022  Vaping Use   Vaping status: Never Used  Substance and Sexual Activity   Alcohol use: No   Drug use: No   Sexual activity: Never  Other Topics Concern   Not on file  Social History Narrative   Not on file   Social Determinants of Health   Financial Resource Strain: Low Risk  (03/11/2023)   Received from [facility]   Overall Financial Resource Strain (CARDIA)    Difficulty of Paying Living Expenses: Not very hard  Food Insecurity: No Food Insecurity (03/16/2023)   Hunger Vital Sign    Worried About Running Out of Food in the Last Year: Never true    Ran Out of Food in the Last Year: Never true  Transportation Needs: No Transportation Needs (03/16/2023)   PRAPARE - Administrator, Civil Service (Medical): No    Lack of Transportation (Non-Medical): No  Physical Activity: Not on file  Stress: Not  on file  Social Connections: Not on file   Family History  Problem Relation Age of Onset   Diabetes Mother    Cancer Mother    COPD Father    Hypertension Father    Hypertension Sister    Hypertension Sister    Hypertension Sister    Hypertension Brother    Hypertension Brother    Hypertension Brother    Hypertension Brother    Hypertension Brother    Stroke Brother    Cancer Brother    No Known Allergies Prior to Admission medications   Medication Sig Start Date End Date Taking? Authorizing Provider  Calcium Carbonate-Vitamin D (CALTRATE 600+D PO) Take 1 tablet by mouth daily.   Yes [provider]  carvedilol (COREG) 6.25 MG tablet Take 1 tablet (6.25 mg total) by mouth 2 (two) times daily with a meal. 09/16/22  Yes Patwardhan, Manish J, MD  cetirizine (ZYRTEC) 10 MG tablet Take 10 mg by mouth  daily.   Yes [provider]  fluconazole (DIFLUCAN) 200 MG tablet Take 200 mg by mouth daily. 03/03/23  Yes [provider]  glipiZIDE (GLUCOTROL) 10 MG tablet Take 10 mg by mouth daily before breakfast. 02/24/22  Yes [provider]  GNP ASPIRIN LOW DOSE 81 MG EC tablet TAKE 1 TABLET BY MOUTH ONCE DAILY. SWALLOW WHOLE. Patient taking differently: Take 81 mg by mouth daily. 03/14/21  Yes Patwardhan, Manish J, MD  guaiFENesin (MUCINEX) 600 MG 12 hr tablet Take 2 tablets (1,200 mg total) by mouth 2 (two) times daily. 03/07/23  Yes Beatty, Celeste A, PA-C  insulin aspart (NOVOLOG) 100 UNIT/ML injection Inject 15 Units into the skin 3 (three) times daily with meals.   Yes [provider]  levothyroxine (SYNTHROID) 125 MCG tablet Take 125 mcg by mouth every morning. 05/14/22  Yes [provider]  losartan (COZAAR) 25 MG tablet Take 1 tablet (25 mg total) by mouth daily. Patient taking differently: Take 25 mg by mouth daily as needed (high bp). 01/02/22  Yes Patwardhan, Manish J, MD  magnesium oxide (MAG-OX) 400 MG tablet Take 400 mg by mouth  daily.   Yes [provider]  nitroGLYCERIN (NITROSTAT) 0.4 MG SL tablet Place 1 tablet (0.4 mg total) under the tongue every 5 (five) minutes as needed for chest pain. 07/23/22 03/16/23 Yes Patwardhan, Manish J, MD  omeprazole (PRILOSEC) 20 MG capsule Take 20 mg by mouth daily. 04/24/22  Yes [provider]  TRESIBA FLEXTOUCH 200 UNIT/ML FlexTouch Pen Inject 55 Units into the skin daily. 10/18/20  Yes [provider]  vitamin B-12 (CYANOCOBALAMIN) 1000 MCG tablet Take 1,000 mcg by mouth daily.   Yes [provider]  Evolocumab (REPATHA SURECLICK) 140 MG/ML SOAJ Inject 140 mg into the skin every 14 (fourteen) days. Patient not taking: Reported on 03/16/2023 11/16/22   Elder Negus, MD   CT PELVIS WO CONTRAST  Result Date: 03/16/2023 CLINICAL DATA:  Hip trauma EXAM: CT PELVIS WITHOUT CONTRAST TECHNIQUE: Multidetector CT imaging of the pelvis was performed following the standard protocol without intravenous contrast. RADIATION DOSE REDUCTION: This exam was performed according to the departmental dose-optimization program which includes automated exposure control, adjustment of the mA and/or kV according to patient size and/or use of iterative reconstruction technique. COMPARISON:  None Available. FINDINGS: Urinary Tract:  No abnormality visualized. Bowel:  Unremarkable visualized pelvic bowel loops. Vascular/Lymphatic: There are atherosclerotic calcifications present. No acute abnormality. No enlarged lymph nodes. Reproductive:  Ovaries and uterus are within normal limits. Other: There is a small fat containing umbilical hernia. There is no ascites. Musculoskeletal: There is a mildly impacted right femoral neck fracture. There is no dislocation. Mild degenerative changes affect the hips. There is mild soft tissue swelling lateral to the right hip. IMPRESSION: Mildly impacted right femoral neck fracture. Electronically Signed   By: Darliss Cheney M.D.   On: 03/16/2023  19:25   DG Lumbar Spine Complete  Result Date: 03/16/2023 CLINICAL DATA:  Lower back pain after fall today. EXAM: LUMBAR SPINE - COMPLETE 4+ VIEW COMPARISON:  None Available. FINDINGS: There is no evidence of lumbar spine fracture. Alignment is normal. Intervertebral disc spaces are maintained. Mild anterior osteophyte formation is noted at L2-3 and L4-5. IMPRESSION: Mild multilevel degenerative changes.  No acute abnormality seen. Electronically Signed   By: Lupita Raider M.D.   On: 03/16/2023 12:36   DG Hip Unilat W or Wo Pelvis 2-3 Views Right  Result Date: 03/16/2023 CLINICAL DATA:  Right hip pain after fall today. EXAM: DG HIP (WITH OR WITHOUT PELVIS) 2-3V RIGHT COMPARISON:  None Available. FINDINGS: Mildly displaced right proximal femoral neck fracture is noted. IMPRESSION: Mildly displaced proximal right femoral neck fracture. Electronically Signed   By: Lupita Raider M.D.   On: 03/16/2023 12:34    Family History Reviewed and non-contributory, no pertinent history of problems with bleeding or anesthesia    Review of Systems No fevers or chills No numbness or tingling No chest pain No shortness of breath No bowel or bladder dysfunction No GI distress No headaches    OBJECTIVE  Vitals:Patient Vitals for the past 8 hrs:  BP Temp Temp src Pulse Resp SpO2  03/17/23 0734 (!) 145/57 98.1 F (36.7 C) Oral 65 16 98 %   General: Alert, no acute distress Cardiovascular: Extremities are warm Respiratory: No cyanosis, no use of accessory musculature Skin: No lesions in the area of chief complaint  Neurologic: Sensation intact distally  Psychiatric: Patient is competent for consent with normal mood and affect Lymphatic: No swelling obvious and reported other than the area involved in the exam below Extremities  RLE: Extremity held in a fixed position.  ROM deferred due to known fracture.  Sensation is intact distally in the sural, saphenous, DP, SP, and plantar nerve  distribution. 2+ DP pulse.  Toes are WWP.  Active motion intact in the TA/EHL/GS. LLE: Sensation is intact distally in the sural, saphenous, DP, SP, and plantar nerve distribution. 2+ DP pulse.  Toes are WWP.  Active motion intact in the TA/EHL/GS. Tolerates gentle ROM of the hip.  No pain with axial loading.     Test Results Imaging XR and CT scan of the Right hip demonstrates a minimally displaced right femoral neck fracture, with impaction .  Labs cbc Recent Labs    03/16/23 1315 03/17/23 0416  WBC 16.2* 16.3*  HGB 11.0* 10.8*  HCT 35.7* 35.6*  PLT 369 373      Recent Labs    03/16/23 1315 03/17/23 0416  NA 139 136  K 4.1 4.1  CL 107 103  CO2 26 24  GLUCOSE 167* 153*  BUN 10 13  CREATININE 1.22* 1.19*  CALCIUM 8.7* 8.6*

## 2023-03-18 ENCOUNTER — Inpatient Hospital Stay (HOSPITAL_COMMUNITY): Payer: BC Managed Care – PPO

## 2023-03-18 ENCOUNTER — Encounter (HOSPITAL_COMMUNITY)
Admission: EM | Disposition: A | Discharge status: Home Health | Payer: Self-pay | Source: Home / Self Care | Attending: Family Medicine

## 2023-03-18 ENCOUNTER — Other Ambulatory Visit: Payer: Self-pay

## 2023-03-18 ENCOUNTER — Encounter (HOSPITAL_COMMUNITY): Payer: Self-pay | Admitting: Student in an Organized Health Care Education/Training Program

## 2023-03-18 ENCOUNTER — Inpatient Hospital Stay (HOSPITAL_COMMUNITY): Payer: BC Managed Care – PPO | Admitting: Certified Registered"

## 2023-03-18 DIAGNOSIS — S72001A Fracture of unspecified part of neck of right femur, initial encounter for closed fracture: Secondary | ICD-10-CM | POA: Diagnosis not present

## 2023-03-18 DIAGNOSIS — I10 Essential (primary) hypertension: Secondary | ICD-10-CM | POA: Diagnosis not present

## 2023-03-18 HISTORY — PX: HIP PINNING,CANNULATED: SHX1758

## 2023-03-18 LAB — CBC WITH DIFFERENTIAL/PLATELET
Abs Immature Granulocytes: 0.05 10*3/uL (ref 0.00–0.07)
Basophils Absolute: 0.1 10*3/uL (ref 0.0–0.1)
Basophils Relative: 1 %
Eosinophils Absolute: 0.8 10*3/uL — ABNORMAL HIGH (ref 0.0–0.5)
Eosinophils Relative: 5 %
HCT: 33.2 % — ABNORMAL LOW (ref 36.0–46.0)
Hemoglobin: 10.5 g/dL — ABNORMAL LOW (ref 12.0–15.0)
Immature Granulocytes: 0 %
Lymphocytes Relative: 17 %
Lymphs Abs: 2.6 10*3/uL (ref 0.7–4.0)
MCH: 23.9 pg — ABNORMAL LOW (ref 26.0–34.0)
MCHC: 31.6 g/dL (ref 30.0–36.0)
MCV: 75.5 fL — ABNORMAL LOW (ref 80.0–100.0)
Monocytes Absolute: 1.3 10*3/uL — ABNORMAL HIGH (ref 0.1–1.0)
Monocytes Relative: 8 %
Neutro Abs: 10.1 10*3/uL — ABNORMAL HIGH (ref 1.7–7.7)
Neutrophils Relative %: 69 %
Platelets: 352 10*3/uL (ref 150–400)
RBC: 4.4 MIL/uL (ref 3.87–5.11)
RDW: 18.8 % — ABNORMAL HIGH (ref 11.5–15.5)
WBC: 14.9 10*3/uL — ABNORMAL HIGH (ref 4.0–10.5)
nRBC: 0 % (ref 0.0–0.2)

## 2023-03-18 LAB — GLUCOSE, CAPILLARY
Glucose-Capillary: 155 mg/dL — ABNORMAL HIGH (ref 70–99)
Glucose-Capillary: 144 mg/dL — ABNORMAL HIGH (ref 70–99)
Glucose-Capillary: 166 mg/dL — ABNORMAL HIGH (ref 70–99)
Glucose-Capillary: 211 mg/dL — ABNORMAL HIGH (ref 70–99)
Glucose-Capillary: 502 mg/dL (ref 70–99)
Glucose-Capillary: 226 mg/dL — ABNORMAL HIGH (ref 70–99)

## 2023-03-18 LAB — GLUCOSE, RANDOM: Glucose, Bld: 487 mg/dL — ABNORMAL HIGH (ref 70–99)

## 2023-03-18 LAB — SURGICAL PCR SCREEN
MRSA, PCR: NEGATIVE
Staphylococcus aureus: NEGATIVE

## 2023-03-18 SURGERY — FIXATION, FEMUR, NECK, PERCUTANEOUS, USING SCREW
Anesthesia: General | Site: Hip | Laterality: Right

## 2023-03-18 MED ORDER — IPRATROPIUM-ALBUTEROL 0.5-2.5 (3) MG/3ML IN SOLN
3.0000 mL | Freq: Once | RESPIRATORY_TRACT | Status: AC
  Administered 2023-03-18: 3 mL via RESPIRATORY_TRACT

## 2023-03-18 MED ORDER — IPRATROPIUM-ALBUTEROL 0.5-2.5 (3) MG/3ML IN SOLN
3.0000 mL | Freq: Four times a day (QID) | RESPIRATORY_TRACT | Status: DC | PRN
  Administered 2023-03-18: 3 mL via RESPIRATORY_TRACT
  Filled 2023-03-18: qty 3

## 2023-03-18 MED ORDER — PHENYLEPHRINE 80 MCG/ML (10ML) SYRINGE FOR IV PUSH (FOR BLOOD PRESSURE SUPPORT)
PREFILLED_SYRINGE | INTRAVENOUS | Status: AC
  Filled 2023-03-18: qty 10

## 2023-03-18 MED ORDER — IPRATROPIUM-ALBUTEROL 0.5-2.5 (3) MG/3ML IN SOLN: 3.0000 mL | Freq: Four times a day (QID) | RESPIRATORY_TRACT | Status: DC | PRN

## 2023-03-18 MED ORDER — MIDAZOLAM HCL 2 MG/2ML IJ SOLN
INTRAMUSCULAR | Status: DC | PRN
  Administered 2023-03-18: 2 mg via INTRAVENOUS

## 2023-03-18 MED ORDER — CEFAZOLIN SODIUM-DEXTROSE 2-4 GM/100ML-% IV SOLN
2.0000 g | Freq: Three times a day (TID) | INTRAVENOUS | Status: AC
  Administered 2023-03-18 – 2023-03-19 (×3): 2 g via INTRAVENOUS
  Filled 2023-03-18 (×3): qty 100

## 2023-03-18 MED ORDER — ORAL CARE MOUTH RINSE: 15.0000 mL | Freq: Once | OROMUCOSAL | Status: AC

## 2023-03-18 MED ORDER — DIPHENHYDRAMINE HCL 50 MG/ML IJ SOLN
INTRAMUSCULAR | Status: AC
  Filled 2023-03-18: qty 1

## 2023-03-18 MED ORDER — FENTANYL CITRATE PF 50 MCG/ML IJ SOSY: 25.0000 ug | PREFILLED_SYRINGE | INTRAMUSCULAR | Status: DC | PRN

## 2023-03-18 MED ORDER — ONDANSETRON HCL 4 MG/2ML IJ SOLN
INTRAMUSCULAR | Status: DC | PRN
  Administered 2023-03-18: 4 mg via INTRAVENOUS

## 2023-03-18 MED ORDER — BUPIVACAINE HCL (PF) 0.5 % IJ SOLN
INTRAMUSCULAR | Status: AC
  Filled 2023-03-18: qty 30

## 2023-03-18 MED ORDER — DIPHENHYDRAMINE HCL 50 MG/ML IJ SOLN
INTRAMUSCULAR | Status: DC | PRN
Start: 2023-03-18 — End: 2023-03-18
  Administered 2023-03-18: 12.5 mg via INTRAVENOUS

## 2023-03-18 MED ORDER — IPRATROPIUM-ALBUTEROL 0.5-2.5 (3) MG/3ML IN SOLN
RESPIRATORY_TRACT | Status: AC
  Filled 2023-03-18: qty 3

## 2023-03-18 MED ORDER — ONDANSETRON HCL 4 MG/2ML IJ SOLN: 4.0000 mg | Freq: Once | INTRAMUSCULAR | Status: DC | PRN

## 2023-03-18 MED ORDER — INSULIN GLARGINE-YFGN 100 UNIT/ML ~~LOC~~ SOLN
30.0000 [IU] | Freq: Every day | SUBCUTANEOUS | Status: DC
  Administered 2023-03-18: 30 [IU] via SUBCUTANEOUS
  Filled 2023-03-18 (×2): qty 0.3

## 2023-03-18 MED ORDER — 0.9 % SODIUM CHLORIDE (POUR BTL) OPTIME
TOPICAL | Status: DC | PRN
  Administered 2023-03-18: 1000 mL

## 2023-03-18 MED ORDER — OXYCODONE HCL 5 MG PO TABS: 5.0000 mg | ORAL_TABLET | Freq: Once | ORAL | Status: DC | PRN

## 2023-03-18 MED ORDER — LACTATED RINGERS IV SOLN: INTRAVENOUS | Status: DC

## 2023-03-18 MED ORDER — LIDOCAINE HCL (PF) 2 % IJ SOLN
INTRAMUSCULAR | Status: AC
  Filled 2023-03-18: qty 5

## 2023-03-18 MED ORDER — FENTANYL CITRATE (PF) 250 MCG/5ML IJ SOLN
INTRAMUSCULAR | Status: DC | PRN
  Administered 2023-03-18 (×2): 50 ug via INTRAVENOUS

## 2023-03-18 MED ORDER — FENTANYL CITRATE (PF) 100 MCG/2ML IJ SOLN
INTRAMUSCULAR | Status: AC
  Filled 2023-03-18: qty 2

## 2023-03-18 MED ORDER — MIDAZOLAM HCL 2 MG/2ML IJ SOLN
INTRAMUSCULAR | Status: AC
  Filled 2023-03-18: qty 2

## 2023-03-18 MED ORDER — HYDROMORPHONE HCL 1 MG/ML IJ SOLN
INTRAMUSCULAR | Status: DC | PRN
Start: 2023-03-18 — End: 2023-03-18
  Administered 2023-03-18: .5 mg via INTRAVENOUS

## 2023-03-18 MED ORDER — PROPOFOL 500 MG/50ML IV EMUL
INTRAVENOUS | Status: DC | PRN
  Administered 2023-03-18: 150 ug/kg/min via INTRAVENOUS

## 2023-03-18 MED ORDER — INSULIN ASPART 100 UNIT/ML IJ SOLN
35.0000 [IU] | Freq: Once | INTRAMUSCULAR | Status: AC
  Administered 2023-03-18: 35 [IU] via SUBCUTANEOUS

## 2023-03-18 MED ORDER — LIDOCAINE HCL (CARDIAC) PF 100 MG/5ML IV SOSY
PREFILLED_SYRINGE | INTRAVENOUS | Status: DC | PRN
  Administered 2023-03-18: 80 mg via INTRAVENOUS

## 2023-03-18 MED ORDER — OXYCODONE HCL 5 MG/5ML PO SOLN: 5.0000 mg | Freq: Once | ORAL | Status: DC | PRN

## 2023-03-18 MED ORDER — INSULIN GLARGINE-YFGN 100 UNIT/ML ~~LOC~~ SOLN
12.0000 [IU] | Freq: Every day | SUBCUTANEOUS | Status: DC
  Filled 2023-03-18: qty 0.12

## 2023-03-18 MED ORDER — HYDROMORPHONE HCL 1 MG/ML IJ SOLN
INTRAMUSCULAR | Status: AC
  Filled 2023-03-18: qty 1

## 2023-03-18 MED ORDER — DEXAMETHASONE SODIUM PHOSPHATE 10 MG/ML IJ SOLN
INTRAMUSCULAR | Status: DC | PRN
  Administered 2023-03-18: 6 mg via INTRAVENOUS

## 2023-03-18 MED ORDER — PROPOFOL 500 MG/50ML IV EMUL
INTRAVENOUS | Status: AC
  Filled 2023-03-18: qty 50

## 2023-03-18 MED ORDER — DEXAMETHASONE SODIUM PHOSPHATE 10 MG/ML IJ SOLN
INTRAMUSCULAR | Status: AC
  Filled 2023-03-18: qty 1

## 2023-03-18 MED ORDER — SCOPOLAMINE 1 MG/3DAYS TD PT72: 1.0000 | MEDICATED_PATCH | TRANSDERMAL | Status: DC

## 2023-03-18 MED ORDER — PROPOFOL 10 MG/ML IV BOLUS
INTRAVENOUS | Status: AC
  Filled 2023-03-18: qty 20

## 2023-03-18 MED ORDER — SCOPOLAMINE 1 MG/3DAYS TD PT72
MEDICATED_PATCH | TRANSDERMAL | Status: AC
  Administered 2023-03-18: 1.5 mg via TRANSDERMAL
  Filled 2023-03-18: qty 1

## 2023-03-18 MED ORDER — CHLORHEXIDINE GLUCONATE 0.12 % MT SOLN
15.0000 mL | Freq: Once | OROMUCOSAL | Status: AC
  Administered 2023-03-18: 15 mL via OROMUCOSAL

## 2023-03-18 MED ORDER — PHENYLEPHRINE HCL-NACL 20-0.9 MG/250ML-% IV SOLN
INTRAVENOUS | Status: AC
  Filled 2023-03-18: qty 250

## 2023-03-18 MED ORDER — STERILE WATER FOR IRRIGATION IR SOLN
Status: DC | PRN
  Administered 2023-03-18: 500 mL

## 2023-03-18 MED ORDER — ONDANSETRON HCL 4 MG/2ML IJ SOLN
INTRAMUSCULAR | Status: AC
  Filled 2023-03-18: qty 2

## 2023-03-18 MED ORDER — INSULIN ASPART 100 UNIT/ML IJ SOLN
10.0000 [IU] | Freq: Three times a day (TID) | INTRAMUSCULAR | Status: DC
  Administered 2023-03-19: 10 [IU] via SUBCUTANEOUS

## 2023-03-18 MED ORDER — INSULIN ASPART 100 UNIT/ML IJ SOLN: 5.0000 [IU] | Freq: Three times a day (TID) | INTRAMUSCULAR | Status: DC

## 2023-03-18 MED ORDER — PROPOFOL 10 MG/ML IV BOLUS
INTRAVENOUS | Status: DC | PRN
Start: 2023-03-18 — End: 2023-03-18
  Administered 2023-03-18: 180 mg via INTRAVENOUS

## 2023-03-18 MED ORDER — PROPOFOL 10 MG/ML IV BOLUS
INTRAVENOUS | Status: AC
Start: 2023-03-18 — End: ?
  Filled 2023-03-18: qty 20

## 2023-03-18 MED ORDER — ASPIRIN 81 MG PO TBEC
81.0000 mg | DELAYED_RELEASE_TABLET | Freq: Two times a day (BID) | ORAL | Status: DC
  Administered 2023-03-19: 81 mg via ORAL
  Filled 2023-03-18 (×2): qty 1

## 2023-03-18 MED ORDER — BUPIVACAINE HCL (PF) 0.5 % IJ SOLN
INTRAMUSCULAR | Status: DC | PRN
  Administered 2023-03-18: 30 mL

## 2023-03-18 MED ORDER — PROPOFOL 500 MG/50ML IV EMUL
INTRAVENOUS | Status: AC
Start: 2023-03-18 — End: ?
  Filled 2023-03-18: qty 50

## 2023-03-18 SURGICAL SUPPLY — 43 items
BAG HAMPER (MISCELLANEOUS) ×1 IMPLANT
BIT DRILL CANN 5.0 (BIT) IMPLANT
BLADE SURG SZ10 CARB STEEL (BLADE) ×2 IMPLANT
BNDG GAUZE ELAST 4 BULKY (GAUZE/BANDAGES/DRESSINGS) ×1 IMPLANT
CHLORAPREP W/TINT 26 (MISCELLANEOUS) ×1 IMPLANT
CLOTH BEACON ORANGE TIMEOUT ST (SAFETY) ×1 IMPLANT
CLSR STERI-STRIP ANTIMIC 1/2X4 (GAUZE/BANDAGES/DRESSINGS) ×1 IMPLANT
COVER LIGHT HANDLE STERIS (MISCELLANEOUS) ×2 IMPLANT
COVER PERINEAL POST (MISCELLANEOUS) ×1 IMPLANT
DECANTER SPIKE VIAL GLASS SM (MISCELLANEOUS) ×2 IMPLANT
DRAPE STERI IOBAN 125X83 (DRAPES) ×1 IMPLANT
DRSG MEPILEX SACRM 8.7X9.8 (GAUZE/BANDAGES/DRESSINGS) ×1 IMPLANT
DRSG TEGADERM 4X4.75 (GAUZE/BANDAGES/DRESSINGS) ×2 IMPLANT
GAUZE SPONGE 4X4 12PLY STRL (GAUZE/BANDAGES/DRESSINGS) ×1 IMPLANT
GLOVE BIO SURGEON STRL SZ8 (GLOVE) ×2 IMPLANT
GLOVE BIOGEL PI IND STRL 7.0 (GLOVE) ×2 IMPLANT
GLOVE SRG 8 PF TXTR STRL LF DI (GLOVE) ×1 IMPLANT
GOWN STRL REUS W/TWL LRG LVL3 (GOWN DISPOSABLE) ×3 IMPLANT
INST SET MAJOR BONE (KITS) ×1 IMPLANT
KIT BLADEGUARD II DBL (SET/KITS/TRAYS/PACK) ×1 IMPLANT
KIT TURNOVER CYSTO (KITS) ×1 IMPLANT
MANIFOLD NEPTUNE II (INSTRUMENTS) ×1 IMPLANT
MARKER SKIN DUAL TIP RULER LAB (MISCELLANEOUS) ×1 IMPLANT
NDL HYPO 21X1.5 SAFETY (NEEDLE) ×1 IMPLANT
NDL SPNL 18GX3.5 QUINCKE PK (NEEDLE) IMPLANT
NEEDLE HYPO 21X1.5 SAFETY (NEEDLE) ×1
NEEDLE SPNL 18GX3.5 QUINCKE PK (NEEDLE) ×1
NS IRRIG 1000ML POUR BTL (IV SOLUTION) ×1 IMPLANT
PACK SRG BSC III STRL LF ECLPS (CUSTOM PROCEDURE TRAY) ×1 IMPLANT
PAD ABD 5X9 TENDERSORB (GAUZE/BANDAGES/DRESSINGS) ×1 IMPLANT
PENCIL SMOKE EVACUATOR COATED (MISCELLANEOUS) ×1 IMPLANT
PIN GUIDE THRD AR 3.2X330 (PIN) IMPLANT
SCREW CANN 7X80 (Screw) IMPLANT
SET BASIN LINEN APH (SET/KITS/TRAYS/PACK) ×1 IMPLANT
SPONGE T-LAP 18X18 ~~LOC~~+RFID (SPONGE) ×2 IMPLANT
STRIP CLOSURE SKIN 1/2X4 (GAUZE/BANDAGES/DRESSINGS) IMPLANT
SUT MNCRL AB 4-0 PS2 18 (SUTURE) ×1 IMPLANT
SUT MON AB 2-0 CT1 36 (SUTURE) ×1 IMPLANT
SUT VIC AB 0 CT1 27XBRD ANTBC (SUTURE) ×1 IMPLANT
SYR 30ML LL (SYRINGE) ×1 IMPLANT
SYR BULB IRRIG 60ML STRL (SYRINGE) ×2 IMPLANT
WASHER FLAT 7.0 (Washer) IMPLANT
YANKAUER SUCT BULB TIP 10FT TU (MISCELLANEOUS) ×1 IMPLANT

## 2023-03-18 NOTE — Progress Notes (Signed)
   ORTHOPAEDIC PROGRESS NOTE  Scheduled for Right Hip CRPP  DOS: 03/18/2023  SUBJECTIVE: No issues over night.  Poor appetite.  Pain medications cause nausea.  No numbness or tingling.  NPO since midnight  OBJECTIVE: PE:  Alert and oriented, no acute distress  Right hip without deformity Minimal swelling Toes are warm and well perfused Sensation intact on the dorsum of the foot Active motion of the EHL and TA intact  Vitals:   03/18/23 0318 03/18/23 0644  BP: (!) 140/68 (!) 150/66  Pulse: 73 75  Resp: 20   Temp: 98.7 F (37.1 C) 99.1 F (37.3 C)  SpO2: 94% 99%       Latest Ref Rng & Units 03/18/2023    4:22 AM 03/17/2023    4:16 AM 03/16/2023    1:15 PM  CBC  WBC 4.0 - 10.5 K/uL 14.9  16.3  16.2   Hemoglobin 12.0 - 15.0 g/dL 16.1  09.6  04.5   Hematocrit 36.0 - 46.0 % 33.2  35.6  35.7   Platelets 150 - 400 K/uL 352  373  369     ASSESSMENT: Monica Bell is a 62 y.o. female doing well.  Ready for OR.  NPO since midnight.   PLAN: Weightbearing: NWB RLE Incisional and dressing care: Reinforce dressings as needed; none currently Orthopedic device(s): None VTE prophylaxis: Recommend Aspirin 81mg  BID; to begin POD#1.  Early mobilization.  Bilateral SCDs Pain control: PO pain medications as needed; judicious use of narcotics Follow - up plan: 2 weeks   Contact information:     Proctor Carriker A. Dallas Schimke, MD MS [facility] [address] [place],  [place]  [address] Phone: [phone] Fax: [phone]

## 2023-03-18 NOTE — Progress Notes (Signed)
PROGRESS NOTE   Monica Bell  UVO:[id number] DOB: [birth year] DOA: 03/16/2023 PCP: [name], NP   Chief Complaint  Patient presents with   Fall   Level of care: Med-Surg  Brief Admission History:  62 y.o. female with a PMH significant for diabetes, hypertension, and hypothyroidism.  At baseline, they live at home alone and are completely independent with ADLs.   They presented from home to the ED on 03/16/2023 with fall from 3-4 level stairs to a concrete floor this morning resulting in acute right hip pain.  Immediately following the accident, she was alert and oriented without any loss of consciousness and was able to pull herself to a standing position.  For a short period of time she was able to ambulate and even walked up and down the stairs again after her fall.  She then laid down in her bed and was unable to get back up again.  This is when she called her daughter and EMS.  Denies any prior bone fractures.  She did not take any of her home medications this morning and has not eaten or drank anything today.   In the ED, it was found that they had stable vital signs with the exception of moderate hypertension.  Significant findings included: WBC 16.2, hemoglobin 11, platelets 369.  Na+ 139, K+ 4.1, glucose 167, creatinine 1.22 (at baseline).  Lumbar x-ray showing mild degenerative changes but no acute abnormalities Right hip and pelvis x-ray: Mildly displaced proximal right femoral neck fracture.  CT pelvis completed and read is pending at this time   They were initially treated with analgesia, Zofran.  Orthopedic surgeon [name] has been consulted and plans to take to the OR 03/18/23.    Patient was admitted to medicine service for further workup and management of right hip fracture as outlined in detail below.     Assessment and Plan:  Fall  right closed, mildly displaced fracture of femoral neck-CT scan pending -Ortho surgery consulted by EDP.  OR planned  11/21 -Analgesia as needed, as well as bowel regiment for constipation prevention -postop s/p CRPP of the right femoral neck fracture on 03/18/23 -toe touch weight bearing on the right leg, using a walker at all times -dressing to remain in place until POD#2/3 -follow up with orthopedics in 10-14 days for incision check and XR of right hip -ASA 81 mg BID to begin on POD#1 per ortho for DVT prophylaxis    Uncontrolled Type 2 diabetes mellitus-most recent hemoglobin A1c was 11.1 in 2023.  EF 55 to 60% 07/2022 -Sliding scale insulin and prandial novolog 5 units TIDAC -semglee 12 units daily  -CBG 5 times per day -Hemoglobin A1c - 10.4%  CBG (last 3)  Recent Labs    03/18/23 0629 03/18/23 0920 03/18/23 1112  GLUCAP 144* 166* 211*    HTN-poorly controlled in setting of acute pain and has not taken home medications today -Restart home medications: Losartan -added hydralazine IV PRN SBP>150   Hypothyroidism -Continue home levothyroxine   CAD  history of STEMI  HFrEF-denies current chest pain -Nitroglycerin as needed -statin intolerance -Continue carvedilol, aspirin   Nicotine dependence -Nicotine replacement as needed  DVT prophylaxis: aspirin 81 mg BID to start 11/22 (POD#1)  Code Status: Full  Family Communication:  Disposition: anticipate home with [facility]    Consultants:  Dr. Dallas Schimke orthopedics  Procedures:  Planned hip repair for 11/21  Antimicrobials:    Subjective: Pt eager to get back home, reports pain seems  well controlled at this time. Pt tolerated diet well post surgery.   Objective: Vitals:   03/18/23 1003 03/18/23 1015 03/18/23 1053 03/18/23 1504  BP: (!) 170/74 (!) 158/71 137/68 (!) 151/68  Pulse: (!) 55 64 64 73  Resp: 14 14 16 18   Temp:  97.7 F (36.5 C) 98.3 F (36.8 C) 98.6 F (37 C)  TempSrc:   Oral Oral  SpO2: 100% 99% 95% 96%  Weight:      Height:        Intake/Output Summary (Last 24 hours) at 03/18/2023 1632 Last data filed at  03/18/2023 1624 Gross per 24 hour  Intake 1680 ml  Output 750 ml  Net 930 ml   Filed Weights   03/16/23 0759  Weight: 89.4 kg   Examination:  General exam: Appears calm and comfortable  Respiratory system: Clear to auscultation. Respiratory effort normal. Cardiovascular system: normal S1 & S2 heard. No JVD, murmurs, rubs, gallops or clicks. No pedal edema. Gastrointestinal system: Abdomen is nondistended, soft and nontender. No organomegaly or masses felt. Normal bowel sounds heard. Central nervous system: Alert and oriented. No focal neurological deficits. Extremities: right leg in bandages from surgery.  Skin: No rashes, lesions or ulcers. Psychiatry: Judgement and insight appear normal. Mood & affect appropriate.   Data Reviewed: I have personally reviewed following labs and imaging studies  CBC: Recent Labs  Lab 03/16/23 1315 03/17/23 0416 03/18/23 0422  WBC 16.2* 16.3* 14.9*  NEUTROABS  --   --  10.1*  HGB 11.0* 10.8* 10.5*  HCT 35.7* 35.6* 33.2*  MCV 76.1* 75.6* 75.5*  PLT 369 373 352    Basic Metabolic Panel: Recent Labs  Lab 03/16/23 1315 03/17/23 0416  NA 139 136  K 4.1 4.1  CL 107 103  CO2 26 24  GLUCOSE 167* 153*  BUN 10 13  CREATININE 1.22* 1.19*  CALCIUM 8.7* 8.6*    CBG: Recent Labs  Lab 03/17/23 2143 03/18/23 0320 03/18/23 0629 03/18/23 0920 03/18/23 1112  GLUCAP 191* 155* 144* 166* 211*    Recent Results (from the past 240 hour(s))  Surgical PCR screen     Status: None   Collection Time: 03/18/23 12:05 AM   Specimen: Nasal Mucosa; Nasal Swab  Result Value Ref Range Status   MRSA, PCR NEGATIVE NEGATIVE Final   Staphylococcus aureus NEGATIVE NEGATIVE Final    Comment: (NOTE) The Xpert SA Assay (FDA approved for NASAL specimens in patients 62 years of age and older), is one component of a comprehensive surveillance program. It is not intended to diagnose infection nor to guide or monitor treatment. Performed at [facility], [address], [place], [place] [address]      Radiology Studies: DG HIP UNILAT WITH PELVIS 2-3 VIEWS RIGHT  Result Date: 03/18/2023 CLINICAL DATA:  62 year old female with right femoral neck fracture. EXAM: DG HIP (WITH OR WITHOUT PELVIS) 2-3V RIGHT COMPARISON:  Pelvis CT 03/16/2023. FINDINGS: Eight intraoperative fluoroscopic spot views of the right femur beginning at 0909 hours. 3 cannulated screws are placed across the right femoral neck fracture. No adverse features are identified. Alignment appears near anatomic on final images. Fluoroscopy time: 1 minute 3 seconds.  26.02 mGy. IMPRESSION: Right femoral neck fracture ORIF with no adverse features. Electronically Signed   By: Odessa Fleming M.D.   On: 03/18/2023 10:01   DG HIP UNILAT WITH PELVIS 2-3 VIEWS RIGHT  Result Date: 03/18/2023 CLINICAL DATA:  62 year old female with right femoral neck fracture status post ORIF. EXAM: DG  HIP (WITH OR WITHOUT PELVIS) 2-3V RIGHT COMPARISON:  Intraoperative images 0 946 hours today. Pelvis CT 03/16/2023. FINDINGS: AP and cross-table lateral views of the pelvis and right hip. Three cannulated screws traverse the proximal right femur now. No adverse hardware features. Near anatomic alignment. No new osseous abnormality identified. Mild postoperative soft tissue gas in the right thigh. IMPRESSION: ORIF of the proximal right femur fracture. No adverse hardware features. Electronically Signed   By: Odessa Fleming M.D.   On: 03/18/2023 10:00   DG C-Arm 1-60 Min-No Report  Result Date: 03/18/2023 Fluoroscopy was utilized by the requesting physician.  No radiographic interpretation.    Scheduled Meds:  [START ON 03/19/2023] aspirin EC  81 mg Oral BID   carvedilol  6.25 mg Oral BID WC   insulin aspart  0-15 Units Subcutaneous TID WC   levothyroxine  125 mcg Oral q morning   losartan  25 mg Oral Daily   polyethylene glycol  17 g Oral Daily   Continuous Infusions:   ceFAZolin (ANCEF) IV Stopped (03/18/23 1532)      LOS: 2 days   Time spent: 40 mins  Ekansh Sherk Laural Benes, MD How to contact the [facility] Attending or Consulting provider 7A - 7P or covering provider during after hours 7P -7A, for this patient?  Check the care team in [facility] and look for a) attending/consulting [facility] provider listed and b) the [facility] team listed Log into [email or web address] to find provider on call.  Locate the [facility] provider you are looking for under [facility] and page to a number that you can be directly reached. If you still have difficulty reaching the provider, please page the [facility] (Director on Call) for the Hospitalists listed on amion for assistance.  03/18/2023, 4:32 PM

## 2023-03-18 NOTE — Progress Notes (Signed)
Care turned over for remainder of shift.    03/18/23 1630  Hand-off documentation  Hand-off Given Given to shift RN/LPN  Report given to (Full Name) Roanna Epley RN

## 2023-03-18 NOTE — Plan of Care (Signed)
  Problem: Education: Goal: Knowledge of General Education information will improve Description: Including pain rating scale, medication(s)/side effects and non-pharmacologic comfort measures Outcome: Progressing   Problem: Health Behavior/Discharge Planning: Goal: Ability to manage health-related needs will improve Outcome: Progressing   Problem: Pain Management: Goal: General experience of comfort will improve Outcome: Progressing   Problem: Activity: Goal: Risk for activity intolerance will decrease Outcome: Not Met (add Reason)

## 2023-03-18 NOTE — Anesthesia Procedure Notes (Signed)
Procedure Name: LMA Insertion Date/Time: 03/18/2023 7:38 AM  Performed by: Oletha Cruel, CRNAPre-anesthesia Checklist: Patient identified, Emergency Drugs available, Suction available and Patient being monitored Patient Re-evaluated:Patient Re-evaluated prior to induction Oxygen Delivery Method: Circle system utilized Induction Type: IV induction Ventilation: Mask ventilation without difficulty LMA: LMA inserted LMA Size: 4.0 Number of attempts: 1 Placement Confirmation: positive ETCO2, CO2 detector and breath sounds checked- equal and bilateral ETT to lip (cm): Secured with pink tape. No markings on LMA. Tube secured with: Tape Dental Injury: Teeth and Oropharynx as per pre-operative assessment  Comments: Atraumatic insertion of LMA size 4. Lips and teeth remain in preoperative condition.

## 2023-03-18 NOTE — Transfer of Care (Signed)
Immediate Anesthesia Transfer of Care Note  Patient: SPRING WALCZYK  Procedure(s) Performed: PERCUTANEOUS FIXATION OF FEMORAL NECK (Right: Hip)  Patient Location: PACU  Anesthesia Type:General  Level of Consciousness: drowsy and patient cooperative  Airway & Oxygen Therapy: Patient Spontanous Breathing and Patient connected to nasal cannula oxygen  Post-op Assessment: Report given to RN and Post -op Vital signs reviewed and stable  Post vital signs: Reviewed and stable  Last Vitals:  Vitals Value Taken Time  BP 181/71 03/18/23 0921  Temp 36.6 C 03/18/23 0921  Pulse 91 03/18/23 0924  Resp 26 03/18/23 0924  SpO2 95 % 03/18/23 0924  Vitals shown include unfiled device data.  Last Pain:  Vitals:   03/18/23 0921  TempSrc:   PainSc: Asleep      Patients Stated Pain Goal: 5 (03/18/23 1610)  Complications: No notable events documented.

## 2023-03-18 NOTE — Plan of Care (Signed)
Alert and oriented. OR today for hip surgery.  Able to transfer out of bed to [facility] for bathroom.  Pain medicated with PRN, see MAR.   Problem: Education: Goal: Knowledge of General Education information will improve Description: Including pain rating scale, medication(s)/side effects and non-pharmacologic comfort measures 03/18/2023 1625 by Kallie Locks, RN Outcome: Progressing 03/18/2023 1625 by Kallie Locks, RN Outcome: Progressing   Problem: Health Behavior/Discharge Planning: Goal: Ability to manage health-related needs will improve 03/18/2023 1625 by Kallie Locks, RN Outcome: Progressing 03/18/2023 1625 by Kallie Locks, RN Outcome: Progressing   Problem: Clinical Measurements: Goal: Ability to maintain clinical measurements within normal limits will improve 03/18/2023 1625 by Kallie Locks, RN Outcome: Progressing 03/18/2023 1625 by Kallie Locks, RN Outcome: Progressing Goal: Will remain free from infection 03/18/2023 1625 by Kallie Locks, RN Outcome: Progressing 03/18/2023 1625 by Kallie Locks, RN Outcome: Progressing Goal: Diagnostic test results will improve 03/18/2023 1625 by Kallie Locks, RN Outcome: Progressing 03/18/2023 1625 by Kallie Locks, RN Outcome: Progressing Goal: Respiratory complications will improve Outcome: Progressing   Problem: Education: Goal: Verbalization of understanding the information provided (i.e., activity precautions, restrictions, etc) will improve Outcome: Progressing   Problem: Activity: Goal: Ability to ambulate and perform ADLs will improve Outcome: Progressing   Problem: Clinical Measurements: Goal: Postoperative complications will be avoided or minimized Outcome: Progressing

## 2023-03-18 NOTE — Anesthesia Preprocedure Evaluation (Signed)
Anesthesia Evaluation  Patient identified by MRN, date of birth, ID band Patient awake    Reviewed: Allergy & Precautions, H&P , NPO status , Patient's Chart, lab work & pertinent test results, reviewed documented beta blocker date and time   Airway Mallampati: II  TM Distance: >3 FB Neck ROM: full    Dental no notable dental hx.    Pulmonary neg pulmonary ROS, Current Smoker   Pulmonary exam normal breath sounds clear to auscultation       Cardiovascular Exercise Tolerance: Good hypertension, + angina  + CAD and + Past MI  negative cardio ROS + Valvular Problems/Murmurs  Rhythm:regular Rate:Normal     Neuro/Psych  Neuromuscular disease negative neurological ROS  negative psych ROS   GI/Hepatic negative GI ROS, Neg liver ROS,GERD  ,,  Endo/Other  negative endocrine ROSdiabetesHypothyroidism    Renal/GU Renal diseasenegative Renal ROS  negative genitourinary   Musculoskeletal   Abdominal   Peds  Hematology negative hematology ROS (+)   Anesthesia Other Findings   Reproductive/Obstetrics negative OB ROS                             Anesthesia Physical Anesthesia Plan  ASA: 3 and emergent  Anesthesia Plan: General and General LMA   Post-op Pain Management:    Induction:   PONV Risk Score and Plan: Ondansetron  Airway Management Planned:   Additional Equipment:   Intra-op Plan:   Post-operative Plan:   Informed Consent: I have reviewed the patients History and Physical, chart, labs and discussed the procedure including the risks, benefits and alternatives for the proposed anesthesia with the patient or authorized representative who has indicated his/her understanding and acceptance.     Dental Advisory Given  Plan Discussed with: CRNA  Anesthesia Plan Comments:        Anesthesia Quick Evaluation

## 2023-03-18 NOTE — Op Note (Signed)
Orthopaedic Surgery Operative Note (CSN: [id number])  Monica Bell  1961-03-16 Date of Surgery: 03/18/2023   Diagnoses:  Right femoral neck fracture  Procedure: CRPP of the Right femoral neck fracture   Operative Finding Successful completion of the planned procedure.  Placement of 3 x 7.0 mm cannulated screws with washers (superior x 2) to stabilize the Non displaced femoral neck fracture   Post-Op Diagnosis: Same Surgeons:Primary: Oliver Barre, MD Assistants:  None Location: [facility] OR ROOM 4 Anesthesia: General with local anesthesia Antibiotics: Ancef 2 g Tourniquet time: N/A Estimated Blood Loss: 50 cc Complications: None Specimens: None  Implants: Implant Name Type Inv. Item Serial No. Manufacturer Lot No. LRB No. Used Action  WASHER FLAT 7.0 - SSTERILE ON SET Washer WASHER FLAT 7.0 STERILE ON SET [employer]  Right 2 Implanted  7.0 CANNULATED SCREW   STERILE ON SET [employer]  Right 3 Implanted    Indications for Surgery:   Monica Bell is a 62 y.o. female who fell and sustained a minimally displaced right femoral neck fracture.  CT scan confirmed minimal displacement.  In order to restore form and function, I recommended operative fixation.   Benefits and risks of operative and nonoperative management were discussed prior to surgery with the patient and informed consent form was completed.  Specific risks including infection, need for additional surgery, bleeding, nonunion, malunion, AVN, hardware failure, blood clots and more severe complications associated with anesthesia.  She elected to proceed.  Surgical consent was finalized.    Procedure:   The patient was identified properly. Informed consent was obtained and the surgical site was marked. The patient was taken up to suite where general anesthesia was induced.  The patient was positioned supine.  The right leg was prepped and draped in the usual sterile fashion.  Timeout was performed before the beginning  of the case.  We started by making a lateral incision over the hip, in line with the starting point for the screws.  We incised sharply through the IT band to expose the lateral border of the femur.  Under fluoroscopic guidance, we placed the first guide wire along the inferior aspect of the femoral neck.  We ensured that the starting point was superior to the lesser trochanter so as to reduce the risk of a stress riser.  Orthogonal images confirmed the placement of the first guidewire to be inferior and posterior within the femoral neck.  We then used the targeting device and placed 2 additional guidewires in the superior and anterior and then superior and posterior femoral neck. We used fluoroscopy to confirm placement and we were satisfied with the positioning of the guidewires.  Using the measuring device, we determined the length of screw needed.  We then proceeded to drill the lateral cortex.  The inferior screw was inserted first, followed by the superior screws with a washer, inserted under fluoroscopic guidance.  Orthogonal imaging confirmed the location of the screws of sufficient length and the washers were flush with the lateral cortex.  We took multiple fluoroscopic images at multiple angles to confirm reduction and placement of the screws.   We irrigated the wound copiously and then closed the incision in a multilayer fashion with absorbable suture.  Sterile dressing was placed.  Patient was awoken taken to PACU in stable condition.   Post-operative plan:  The patient will be returned to the floor.   Weightbearing status:  Toe touch weight bearing on the right leg, using a walker at  all times.  Dressing to remain in place until POD#2/3 DVT prophylaxis per primary team, no orthopedic contraindications.    Pain control with PRN pain medication preferring oral medicines.   Follow up plan will be scheduled in approximately 10-14 days days for incision check and XR of the Right hip.

## 2023-03-18 NOTE — Anesthesia Postprocedure Evaluation (Signed)
Anesthesia Post Note  Patient: Monica Bell  Procedure(s) Performed: PERCUTANEOUS FIXATION OF FEMORAL NECK (Right: Hip)  Patient location during evaluation: Phase II Anesthesia Type: General Level of consciousness: awake Pain management: pain level controlled Vital Signs Assessment: post-procedure vital signs reviewed and stable Respiratory status: spontaneous breathing and respiratory function stable Cardiovascular status: blood pressure returned to baseline and stable Postop Assessment: no headache and no apparent nausea or vomiting Anesthetic complications: no Comments: Late entry   No notable events documented.   Last Vitals:  Vitals:   03/18/23 1003 03/18/23 1015  BP: (!) 170/74 (!) 158/71  Pulse: (!) 55 64  Resp: 14 14  Temp:  36.5 C  SpO2: 100% 99%    Last Pain:  Vitals:   03/18/23 1015  TempSrc:   PainSc: 0-No pain                 Windell Norfolk

## 2023-03-18 NOTE — Progress Notes (Signed)
Arrived back from PACU. Handoff completed with Merry Proud, RN.  Dressing on right thigh is clean, dry, and intact.     03/18/23 1053  Vitals  Temp 98.3 F (36.8 C)  Temp Source Oral  BP 137/68  MAP (mmHg) 85  BP Location Right Arm  BP Method Automatic  Patient Position (if appropriate) Lying  Pulse Rate 64  Pulse Rate Source Dinamap  Resp 16  Level of Consciousness  Level of Consciousness Alert  MEWS COLOR  MEWS Score Color Green  Oxygen Therapy  SpO2 95 %  O2 Device Room Air  Pain Assessment  Pain Scale 0-10  Pain Score 5  Pain Type Acute pain  Pain Location Hip  Pain Orientation Right  Pain Intervention(s) Refused  MEWS Score  MEWS Temp 0  MEWS Systolic 0  MEWS Pulse 0  MEWS RR 0  MEWS LOC 0  MEWS Score 0

## 2023-03-18 NOTE — Progress Notes (Signed)
 Patient leaving floor for OR

## 2023-03-19 DIAGNOSIS — S72001A Fracture of unspecified part of neck of right femur, initial encounter for closed fracture: Secondary | ICD-10-CM | POA: Diagnosis not present

## 2023-03-19 DIAGNOSIS — S72001D Fracture of unspecified part of neck of right femur, subsequent encounter for closed fracture with routine healing: Secondary | ICD-10-CM

## 2023-03-19 LAB — BASIC METABOLIC PANEL
Anion gap: 8 (ref 5–15)
BUN: 17 mg/dL (ref 8–23)
CO2: 24 mmol/L (ref 22–32)
Calcium: 8.4 mg/dL — ABNORMAL LOW (ref 8.9–10.3)
Chloride: 102 mmol/L (ref 98–111)
Creatinine, Ser: 1.3 mg/dL — ABNORMAL HIGH (ref 0.44–1.00)
GFR, Estimated: 47 mL/min — ABNORMAL LOW (ref >60)
Glucose, Bld: 97 mg/dL (ref 70–99)
Potassium: 3.7 mmol/L (ref 3.5–5.1)
Sodium: 134 mmol/L — ABNORMAL LOW (ref 135–145)

## 2023-03-19 LAB — CBC WITH DIFFERENTIAL/PLATELET
Abs Immature Granulocytes: 0.08 10*3/uL — ABNORMAL HIGH (ref 0.00–0.07)
Basophils Absolute: 0.1 10*3/uL (ref 0.0–0.1)
Basophils Relative: 1 %
Eosinophils Absolute: 0.4 10*3/uL (ref 0.0–0.5)
Eosinophils Relative: 2 %
HCT: 31.8 % — ABNORMAL LOW (ref 36.0–46.0)
Hemoglobin: 9.7 g/dL — ABNORMAL LOW (ref 12.0–15.0)
Immature Granulocytes: 1 %
Lymphocytes Relative: 18 %
Lymphs Abs: 3.1 10*3/uL (ref 0.7–4.0)
MCH: 23.1 pg — ABNORMAL LOW (ref 26.0–34.0)
MCHC: 30.5 g/dL (ref 30.0–36.0)
MCV: 75.7 fL — ABNORMAL LOW (ref 80.0–100.0)
Monocytes Absolute: 1.7 10*3/uL — ABNORMAL HIGH (ref 0.1–1.0)
Monocytes Relative: 10 %
Neutro Abs: 11.8 10*3/uL — ABNORMAL HIGH (ref 1.7–7.7)
Neutrophils Relative %: 68 %
Platelets: 329 10*3/uL (ref 150–400)
RBC: 4.2 MIL/uL (ref 3.87–5.11)
RDW: 18.2 % — ABNORMAL HIGH (ref 11.5–15.5)
WBC: 17.2 10*3/uL — ABNORMAL HIGH (ref 4.0–10.5)
nRBC: 0 % (ref 0.0–0.2)

## 2023-03-19 LAB — MAGNESIUM: Magnesium: 1.9 mg/dL (ref 1.7–2.4)

## 2023-03-19 LAB — GLUCOSE, CAPILLARY
Glucose-Capillary: 67 mg/dL — ABNORMAL LOW (ref 70–99)
Glucose-Capillary: 95 mg/dL (ref 70–99)
Glucose-Capillary: 104 mg/dL — ABNORMAL HIGH (ref 70–99)
Glucose-Capillary: 79 mg/dL (ref 70–99)

## 2023-03-19 MED ORDER — ACETAMINOPHEN 325 MG PO TABS: 650.0000 mg | ORAL_TABLET | Freq: Four times a day (QID) | ORAL | Status: AC | PRN

## 2023-03-19 MED ORDER — POLYETHYLENE GLYCOL 3350 17 G PO PACK: 17.0000 g | PACK | Freq: Every day | ORAL | 0 refills | Status: AC

## 2023-03-19 MED ORDER — LOSARTAN POTASSIUM 25 MG PO TABS: 25.0000 mg | ORAL_TABLET | Freq: Every day | ORAL | Status: DC | PRN

## 2023-03-19 MED ORDER — OMEPRAZOLE 20 MG PO CPDR: 20.0000 mg | DELAYED_RELEASE_CAPSULE | Freq: Every day | ORAL | 2 refills | Status: AC

## 2023-03-19 MED ORDER — INSULIN GLARGINE-YFGN 100 UNIT/ML ~~LOC~~ SOLN
20.0000 [IU] | Freq: Every day | SUBCUTANEOUS | Status: DC
  Filled 2023-03-19: qty 0.2

## 2023-03-19 MED ORDER — ASPIRIN 81 MG PO TBEC: 81.0000 mg | DELAYED_RELEASE_TABLET | Freq: Two times a day (BID) | ORAL | Status: AC

## 2023-03-19 MED ORDER — HYDROCODONE-ACETAMINOPHEN 5-325 MG PO TABS: 1.0000 | ORAL_TABLET | Freq: Four times a day (QID) | ORAL | 0 refills | Status: AC | PRN

## 2023-03-19 NOTE — Progress Notes (Signed)
Pt requires a Bed Side Commode due to hip fracture repair and limitations to hip mobility.

## 2023-03-19 NOTE — Discharge Summary (Signed)
Physician Discharge Summary  Monica Bell:[id number] DOB: December 26, 1960 DOA: 03/16/2023  PCP: [name], NP  Admit date: 03/16/2023 Discharge date: 03/19/2023  Admitted From:  Home  Disposition: Home with Home Health   Recommendations for Outpatient Follow-up:  Follow up with PCP regarding diabetes management Follow up with Dr. Dallas Schimke orthopedics in 10-14 days for postop  Orthopedics Recommendations from Dr. Dallas Schimke -toe touch weight bearing on the right leg, using a walker at all times -dressing to remain in place until POD#2/3 -follow up with orthopedics in 10-14 days for incision check and XR of right hip -ASA 81 mg BID to begin on POD#1 per ortho for DVT prophylaxis   Home Health: PT/OT, DME rolling walker and 3 in 1  Discharge Condition: STABLE   CODE STATUS: FULL DIET: carb modified    Brief Hospitalization Summary: Please see all hospital notes, images, labs for full details of the hospitalization. Admission provider HPI:  62 y.o. female with a PMH significant for diabetes, hypertension, and hypothyroidism.  At baseline, they live at home alone and are completely independent with ADLs.   They presented from home to the ED on 03/16/2023 with fall from 3-4 level stairs to a concrete floor this morning resulting in acute right hip pain.  Immediately following the accident, she was alert and oriented without any loss of consciousness and was able to pull herself to a standing position.  For a short period of time she was able to ambulate and even walked up and down the stairs again after her fall.  She then laid down in her bed and was unable to get back up again.  This is when she called her daughter and EMS.  Denies any prior bone fractures.  She did not take any of her home medications this morning and has not eaten or drank anything today.   In the ED, it was found that they had stable vital signs with the exception of moderate hypertension.  Significant findings  included: WBC 16.2, hemoglobin 11, platelets 369.  Na+ 139, K+ 4.1, glucose 167, creatinine 1.22 (at baseline).  Lumbar x-ray showing mild degenerative changes but no acute abnormalities Right hip and pelvis x-ray: Mildly displaced proximal right femoral neck fracture.  CT pelvis completed and read is pending at this time   They were initially treated with analgesia, Zofran.  Orthopedic surgeon [name] has been consulted and plans to take to the OR 03/18/23.    Patient was admitted to medicine service for further workup and management of right hip fracture as outlined in detail below.   Hospital Course by problem list  Fall  right closed, mildly displaced fracture of femoral neck-CT scan pending -Ortho surgery consulted by EDP.  OR planned 11/21 -Analgesia as needed, as well as bowel regiment for constipation prevention -postop s/p CRPP of the right femoral neck fracture on 03/18/23 -toe touch weight bearing on the right leg, using a walker at all times -dressing to remain in place until POD#2/3 -follow up with orthopedics in 10-14 days for incision check and XR of right hip -ASA 81 mg BID to begin on POD#1 per ortho for DVT prophylaxis    Uncontrolled Type 2 diabetes mellitus-most recent hemoglobin A1c was 11.1 in 2023.  EF 55 to 60% 07/2022 -resume home DM treatments and follow up with PCP for ongoing DM mgmt.  -CBG 5 times per day recommended -hypoglycemia precautions and information on preventing hypoglycemia given -Hemoglobin A1c - 10.4%  CBG (last 3)  Recent Labs    03/19/23 0339 03/19/23 0413 03/19/23 0727  GLUCAP 67* 95 104*   HTN -Restart home medications: Losartan   Hypothyroidism -Continue home levothyroxine   CAD  history of STEMI  HFrEF-denies current chest pain -Nitroglycerin as needed -statin intolerance -Continue carvedilol, aspirin   Nicotine dependence -Nicotine replacement as needed -smoking cessation/tobacco cessation advised   Discharge  Diagnoses:  Principal Problem:   Hip fracture (HCC) Active Problems:   Closed displaced fracture of right femoral neck (HCC)   Fall  Discharge Instructions:  Allergies as of 03/19/2023   No Known Allergies      Medication List     TAKE these medications    acetaminophen 325 MG tablet Commonly known as: TYLENOL Take 2 tablets (650 mg total) by mouth every 6 (six) hours as needed for mild pain (pain score 1-3) or moderate pain (pain score 4-6) (or Fever >/= 100.4).   aspirin EC 81 MG tablet Commonly known as: GNP Aspirin Low Dose Take 1 tablet (81 mg total) by mouth 2 (two) times daily. Swallow whole. What changed: See the new instructions.   CALTRATE 600+D PO Take 1 tablet by mouth daily.   carvedilol 6.25 MG tablet Commonly known as: Coreg Take 1 tablet (6.25 mg total) by mouth 2 (two) times daily with a meal.   cetirizine 10 MG tablet Commonly known as: ZYRTEC Take 10 mg by mouth daily.   cyanocobalamin 1000 MCG tablet Commonly known as: VITAMIN B12 Take 1,000 mcg by mouth daily.   fluconazole 200 MG tablet Commonly known as: DIFLUCAN Take 200 mg by mouth daily.   glipiZIDE 10 MG tablet Commonly known as: GLUCOTROL Take 10 mg by mouth daily before breakfast.   guaiFENesin 600 MG 12 hr tablet Commonly known as: Mucinex Take 2 tablets (1,200 mg total) by mouth 2 (two) times daily.   HYDROcodone-acetaminophen 5-325 MG tablet Commonly known as: NORCO/VICODIN Take 1 tablet by mouth every 6 (six) hours as needed for up to 3 days for severe pain (pain score 7-10).   insulin aspart 100 UNIT/ML injection Commonly known as: novoLOG Inject 15 Units into the skin 3 (three) times daily with meals.   levothyroxine 125 MCG tablet Commonly known as: SYNTHROID Take 125 mcg by mouth every morning.   losartan 25 MG tablet Commonly known as: COZAAR Take 1 tablet (25 mg total) by mouth daily as needed (high bp).   magnesium oxide 400 MG tablet Commonly known as:  MAG-OX Take 400 mg by mouth daily.   nitroGLYCERIN 0.4 MG SL tablet Commonly known as: NITROSTAT Place 1 tablet (0.4 mg total) under the tongue every 5 (five) minutes as needed for chest pain.   omeprazole 20 MG capsule Commonly known as: PRILOSEC Take 1 capsule (20 mg total) by mouth daily.   polyethylene glycol 17 g packet Commonly known as: MIRALAX / GLYCOLAX Take 17 g by mouth daily. Start taking on: March 20, 2023   Repatha SureClick 140 MG/ML Soaj Generic drug: Evolocumab Inject 140 mg into the skin every 14 (fourteen) days.   Evaristo Bury FlexTouch 200 UNIT/ML FlexTouch Pen Generic drug: insulin degludec Inject 55 Units into the skin daily.               Durable Medical Equipment  (From admission, onward)           Start     Ordered   03/19/23 0955  For home use only DME 3 n 1  Once  03/19/23 0954   03/19/23 0932  For home use only DME Walker rolling  Once       Question Answer Comment  Walker: With 5 Inch Wheels   Patient needs a walker to treat with the following condition Abnormality of gait and mobility      03/19/23 0931            Follow-up Information     Care, [facility] Follow up.   Specialty: Home Health Services Why: [facility] staff will call you to schedule in home therapy visits Contact information: [address] [place] [address] [phone]         Oliver Barre, MD. Schedule an appointment as soon as possible for a visit in 10 day(s).   Specialties: Orthopedic Surgery, Sports Medicine Why: Hospital Follow Up Contact information: [address] [place] [address] [phone]         [name], NP. Schedule an appointment as soon as possible for a visit.   Specialty: Nurse Practitioner Why: Hospital Follow Up Contact information: [address] [place] [address] [phone]                No Known Allergies Allergies as of 03/19/2023   No Known  Allergies      Medication List     TAKE these medications    acetaminophen 325 MG tablet Commonly known as: TYLENOL Take 2 tablets (650 mg total) by mouth every 6 (six) hours as needed for mild pain (pain score 1-3) or moderate pain (pain score 4-6) (or Fever >/= 100.4).   aspirin EC 81 MG tablet Commonly known as: GNP Aspirin Low Dose Take 1 tablet (81 mg total) by mouth 2 (two) times daily. Swallow whole. What changed: See the new instructions.   CALTRATE 600+D PO Take 1 tablet by mouth daily.   carvedilol 6.25 MG tablet Commonly known as: Coreg Take 1 tablet (6.25 mg total) by mouth 2 (two) times daily with a meal.   cetirizine 10 MG tablet Commonly known as: ZYRTEC Take 10 mg by mouth daily.   cyanocobalamin 1000 MCG tablet Commonly known as: VITAMIN B12 Take 1,000 mcg by mouth daily.   fluconazole 200 MG tablet Commonly known as: DIFLUCAN Take 200 mg by mouth daily.   glipiZIDE 10 MG tablet Commonly known as: GLUCOTROL Take 10 mg by mouth daily before breakfast.   guaiFENesin 600 MG 12 hr tablet Commonly known as: Mucinex Take 2 tablets (1,200 mg total) by mouth 2 (two) times daily.   HYDROcodone-acetaminophen 5-325 MG tablet Commonly known as: NORCO/VICODIN Take 1 tablet by mouth every 6 (six) hours as needed for up to 3 days for severe pain (pain score 7-10).   insulin aspart 100 UNIT/ML injection Commonly known as: novoLOG Inject 15 Units into the skin 3 (three) times daily with meals.   levothyroxine 125 MCG tablet Commonly known as: SYNTHROID Take 125 mcg by mouth every morning.   losartan 25 MG tablet Commonly known as: COZAAR Take 1 tablet (25 mg total) by mouth daily as needed (high bp).   magnesium oxide 400 MG tablet Commonly known as: MAG-OX Take 400 mg by mouth daily.   nitroGLYCERIN 0.4 MG SL tablet Commonly known as: NITROSTAT Place 1 tablet (0.4 mg total) under the tongue every 5 (five) minutes as needed for chest pain.    omeprazole 20 MG capsule Commonly known as: PRILOSEC Take 1 capsule (20 mg total) by mouth daily.  polyethylene glycol 17 g packet Commonly known as: MIRALAX / GLYCOLAX Take 17 g by mouth daily. Start taking on: March 20, 2023   Repatha SureClick 140 MG/ML Soaj Generic drug: Evolocumab Inject 140 mg into the skin every 14 (fourteen) days.   Evaristo Bury FlexTouch 200 UNIT/ML FlexTouch Pen Generic drug: insulin degludec Inject 55 Units into the skin daily.               Durable Medical Equipment  (From admission, onward)           Start     Ordered   03/19/23 0955  For home use only DME 3 n 1  Once        03/19/23 0954   03/19/23 0932  For home use only DME Walker rolling  Once       Question Answer Comment  Walker: With 5 Inch Wheels   Patient needs a walker to treat with the following condition Abnormality of gait and mobility      03/19/23 0931            Procedures/Studies: DG HIP UNILAT WITH PELVIS 2-3 VIEWS RIGHT  Result Date: 03/18/2023 CLINICAL DATA:  62 year old female with right femoral neck fracture. EXAM: DG HIP (WITH OR WITHOUT PELVIS) 2-3V RIGHT COMPARISON:  Pelvis CT 03/16/2023. FINDINGS: Eight intraoperative fluoroscopic spot views of the right femur beginning at 0909 hours. 3 cannulated screws are placed across the right femoral neck fracture. No adverse features are identified. Alignment appears near anatomic on final images. Fluoroscopy time: 1 minute 3 seconds.  26.02 mGy. IMPRESSION: Right femoral neck fracture ORIF with no adverse features. Electronically Signed   By: Odessa Fleming M.D.   On: 03/18/2023 10:01   DG HIP UNILAT WITH PELVIS 2-3 VIEWS RIGHT  Result Date: 03/18/2023 CLINICAL DATA:  62 year old female with right femoral neck fracture status post ORIF. EXAM: DG HIP (WITH OR WITHOUT PELVIS) 2-3V RIGHT COMPARISON:  Intraoperative images 0 946 hours today. Pelvis CT 03/16/2023. FINDINGS: AP and cross-table lateral views of the pelvis and  right hip. Three cannulated screws traverse the proximal right femur now. No adverse hardware features. Near anatomic alignment. No new osseous abnormality identified. Mild postoperative soft tissue gas in the right thigh. IMPRESSION: ORIF of the proximal right femur fracture. No adverse hardware features. Electronically Signed   By: Odessa Fleming M.D.   On: 03/18/2023 10:00   DG C-Arm 1-60 Min-No Report  Result Date: 03/18/2023 Fluoroscopy was utilized by the requesting physician.  No radiographic interpretation.   CT PELVIS WO CONTRAST  Result Date: 03/16/2023 CLINICAL DATA:  Hip trauma EXAM: CT PELVIS WITHOUT CONTRAST TECHNIQUE: Multidetector CT imaging of the pelvis was performed following the standard protocol without intravenous contrast. RADIATION DOSE REDUCTION: This exam was performed according to the departmental dose-optimization program which includes automated exposure control, adjustment of the mA and/or kV according to patient size and/or use of iterative reconstruction technique. COMPARISON:  None Available. FINDINGS: Urinary Tract:  No abnormality visualized. Bowel:  Unremarkable visualized pelvic bowel loops. Vascular/Lymphatic: There are atherosclerotic calcifications present. No acute abnormality. No enlarged lymph nodes. Reproductive:  Ovaries and uterus are within normal limits. Other: There is a small fat containing umbilical hernia. There is no ascites. Musculoskeletal: There is a mildly impacted right femoral neck fracture. There is no dislocation. Mild degenerative changes affect the hips. There is mild soft tissue swelling lateral to the right hip. IMPRESSION: Mildly impacted right femoral neck fracture. Electronically Signed   By: Linton Rump  Malena Edman M.D.   On: 03/16/2023 19:25   DG Lumbar Spine Complete  Result Date: 03/16/2023 CLINICAL DATA:  Lower back pain after fall today. EXAM: LUMBAR SPINE - COMPLETE 4+ VIEW COMPARISON:  None Available. FINDINGS: There is no evidence of lumbar  spine fracture. Alignment is normal. Intervertebral disc spaces are maintained. Mild anterior osteophyte formation is noted at L2-3 and L4-5. IMPRESSION: Mild multilevel degenerative changes.  No acute abnormality seen. Electronically Signed   By: Lupita Raider M.D.   On: 03/16/2023 12:36   DG Hip Unilat W or Wo Pelvis 2-3 Views Right  Result Date: 03/16/2023 CLINICAL DATA:  Right hip pain after fall today. EXAM: DG HIP (WITH OR WITHOUT PELVIS) 2-3V RIGHT COMPARISON:  None Available. FINDINGS: Mildly displaced right proximal femoral neck fracture is noted. IMPRESSION: Mildly displaced proximal right femoral neck fracture. Electronically Signed   By: Lupita Raider M.D.   On: 03/16/2023 12:34   DG Chest Portable 1 View  Result Date: 03/07/2023 CLINICAL DATA:  Productive cough. EXAM: PORTABLE CHEST 1 VIEW COMPARISON:  May 30, 2022. FINDINGS: The heart size and mediastinal contours are within normal limits. Both lungs are clear. The visualized skeletal structures are unremarkable. IMPRESSION: No active disease. Electronically Signed   By: Lupita Raider M.D.   On: 03/07/2023 11:17     Subjective: Pt says she is eager to go home, tylenol has been controlling the pain. Pt says she has good support at home.    Discharge Exam: Vitals:   03/18/23 2234 03/19/23 0616  BP:  (!) 132/55  Pulse:  65  Resp:  16  Temp:  97.9 F (36.6 C)  SpO2: 97% 100%   Vitals:   03/18/23 1504 03/18/23 1929 03/18/23 2234 03/19/23 0616  BP: (!) 151/68 (!) 154/61  (!) 132/55  Pulse: 73 64  65  Resp: 18 16  16   Temp: 98.6 F (37 C) 98.5 F (36.9 C)  97.9 F (36.6 C)  TempSrc: Oral     SpO2: 96% 98% 97% 100%  Weight:      Height:       General: Pt is alert, awake, not in acute distress Cardiovascular: normal S1/S2 +, no rubs, no gallops Respiratory: CTA bilaterally, no wheezing, no rhonchi Abdominal: Soft, NT, ND, bowel sounds + Extremities: bandages clean dry intact    The results of significant  diagnostics from this hospitalization (including imaging, microbiology, ancillary and laboratory) are listed below for reference.     Microbiology: Recent Results (from the past 240 hour(s))  Surgical PCR screen     Status: None   Collection Time: 03/18/23 12:05 AM   Specimen: Nasal Mucosa; Nasal Swab  Result Value Ref Range Status   MRSA, PCR NEGATIVE NEGATIVE Final   Staphylococcus aureus NEGATIVE NEGATIVE Final    Comment: (NOTE) The Xpert SA Assay (FDA approved for NASAL specimens in patients 56 years of age and older), is one component of a comprehensive surveillance program. It is not intended to diagnose infection nor to guide or monitor treatment. Performed at [facility], [address], [place], [place] [address]      Labs: BNP (last 3 results) No results for input(s): "BNP" in the last 8760 hours. Basic Metabolic Panel: Recent Labs  Lab 03/16/23 1315 03/17/23 0416 03/18/23 1724 03/19/23 0430  NA 139 136  --  134*  K 4.1 4.1  --  3.7  CL 107 103  --  102  CO2 26 24  --  24  GLUCOSE 167* 153* 487* 97  BUN 10 13  --  17  CREATININE 1.22* 1.19*  --  1.30*  CALCIUM 8.7* 8.6*  --  8.4*  MG  --   --   --  1.9   Liver Function Tests: No results for input(s): "AST", "ALT", "ALKPHOS", "BILITOT", "PROT", "ALBUMIN" in the last 168 hours. No results for input(s): "LIPASE", "AMYLASE" in the last 168 hours. No results for input(s): "AMMONIA" in the last 168 hours. CBC: Recent Labs  Lab 03/16/23 1315 03/17/23 0416 03/18/23 0422 03/19/23 0430  WBC 16.2* 16.3* 14.9* 17.2*  NEUTROABS  --   --  10.1* 11.8*  HGB 11.0* 10.8* 10.5* 9.7*  HCT 35.7* 35.6* 33.2* 31.8*  MCV 76.1* 75.6* 75.5* 75.7*  PLT 369 373 352 329   Cardiac Enzymes: No results for input(s): "CKTOTAL", "CKMB", "CKMBINDEX", "TROPONINI" in the last 168 hours. BNP: Invalid input(s): "POCBNP" CBG: Recent Labs  Lab 03/18/23 1632 03/18/23 2128 03/19/23 0339 03/19/23 0413 03/19/23 0727  GLUCAP 502*  226* 67* 95 104*   D-Dimer No results for input(s): "DDIMER" in the last 72 hours. Hgb A1c Recent Labs    03/16/23 1314  HGBA1C 10.4*   Lipid Profile No results for input(s): "CHOL", "HDL", "LDLCALC", "TRIG", "CHOLHDL", "LDLDIRECT" in the last 72 hours. Thyroid function studies No results for input(s): "TSH", "T4TOTAL", "T3FREE", "THYROIDAB" in the last 72 hours.  Invalid input(s): "FREET3" Anemia work up No results for input(s): "VITAMINB12", "FOLATE", "FERRITIN", "TIBC", "IRON", "RETICCTPCT" in the last 72 hours. Urinalysis    Component Value Date/Time   COLORURINE YELLOW 12/06/2020 2053   APPEARANCEUR CLEAR 12/06/2020 2053   LABSPEC >1.046 (H) 12/06/2020 2053   PHURINE 7.0 12/06/2020 2053   GLUCOSEU NEGATIVE 12/06/2020 2053   HGBUR NEGATIVE 12/06/2020 2053   BILIRUBINUR NEGATIVE 12/06/2020 2053   KETONESUR NEGATIVE 12/06/2020 2053   PROTEINUR NEGATIVE 12/06/2020 2053   NITRITE NEGATIVE 12/06/2020 2053   LEUKOCYTESUR TRACE (A) 12/06/2020 2053   Sepsis Labs Recent Labs  Lab 03/16/23 1315 03/17/23 0416 03/18/23 0422 03/19/23 0430  WBC 16.2* 16.3* 14.9* 17.2*   Microbiology Recent Results (from the past 240 hour(s))  Surgical PCR screen     Status: None   Collection Time: 03/18/23 12:05 AM   Specimen: Nasal Mucosa; Nasal Swab  Result Value Ref Range Status   MRSA, PCR NEGATIVE NEGATIVE Final   Staphylococcus aureus NEGATIVE NEGATIVE Final    Comment: (NOTE) The Xpert SA Assay (FDA approved for NASAL specimens in patients 61 years of age and older), is one component of a comprehensive surveillance program. It is not intended to diagnose infection nor to guide or monitor treatment. Performed at [facility], [address] [place], [place] [address]    Time coordinating discharge: 38 mins  SIGNED:  Standley Dakins, MD  [facility] 03/19/2023, 10:27 AM How to contact the [facility] Attending or Consulting provider 7A - 7P or covering provider during after  hours 7P -7A, for this patient?  Check the care team in [facility] and look for a) attending/consulting [facility] provider listed and b) the [facility] team listed Log into [email or web address] and use [facility] universal password to access. If you do not have the password, please contact the hospital operator. Locate the [facility] provider you are looking for under [facility] and page to a number that you can be directly reached. If you still have difficulty reaching the provider, please page the [facility] (Director on Call) for the Hospitalists listed on amion for  assistance.

## 2023-03-19 NOTE — Evaluation (Signed)
Physical Therapy Evaluation Patient Details Name: Monica Bell MRN: [id number] DOB: 1960/05/09 Today's Date: 03/19/2023  History of Present Illness  Expand All Collapse All    untitled image  History and Physical   Monica Bell [id number]:[id number] DOB: [birth year] DOA: 03/16/2023  PCP: [name], NP   Chief Complaint: fall, hip pain  Historian: patient, daughter     HPI:   Monica Bell is a 62 y.o. female with a PMH significant for diabetes, hypertension, and hypothyroidism.  At baseline, they live at home alone and are completely independent with ADLs.     They presented from home to the ED on 03/16/2023 with fall from 3-4 level stairs to a concrete floor this morning resulting in acute right hip pain.  Immediately following the accident, she was alert and oriented without any loss of consciousness and was able to pull herself to a standing position.  For a short period of time she was able to ambulate and even walked up and down the stairs again after her fall.  She then laid down in her bed and was unable to get back up again.  This is when she called her daughter and EMS.  Denies any prior bone fractures.  She did not take any of her home medications this morning and has not eaten or drank anything today.     In the ED, it was found that they had stable vital signs with the exception of moderate hypertension.   Significant findings included: WBC 16.2, hemoglobin 11, platelets 369.  Na+ 139, K+ 4.1, glucose 167, creatinine 1.22 (at baseline)  Lumbar x-ray showing mild degenerative changes but no acute abnormalities  Right hip and pelvis x-ray: Mildly displaced proximal right femoral neck fracture  CT pelvis completed and read is pending at this time     They were initially treated with analgesia, Zofran.  Orthopedic surgery has been consulted and plans to take to the OR Thursday morning.     Patient was admitted to medicine service for further workup and management of right hip fracture  as outlined in detail below.    Clinical Impression  On therapist arrival; patient lying in bed; agreeable to PT and OT assessment.  Patient needs min to mod assist for supine to sit from flat bed to move right leg to the edge of the bed and to bring trunk fully upright due to right leg weakness and pain.  Patient able to sit on the edge of the bed with feet on the floor and hands on the mattress modified independence.  Sit to stand from edge of bed to RW with min assist with cues for toe touch weightbearing right lower extremity.  She is able to stand pivot to the chair and make a couple of small hops backwards using the RW ; stand to sit with cues for technique and reaching back for the chair and cues for maintaining TTWBing right lower extremity.  patient left in chair with call button in reach, chair alarm set and nursing notified of mobility status.  Patient will benefit from continued skilled therapy services during the remainder of her hospital stay and at the next recommended venue of care to address deficits and promote return to optimal function.           If plan is discharge home, recommend the following: A lot of help with walking and/or transfers;A lot of help with bathing/dressing/bathroom;Assist for transportation;Assistance with cooking/housework   Can travel by private vehicle  No    Equipment Recommendations Rolling walker (2 wheels);BSC/3in1  Recommendations for Other Services       Functional Status Assessment Patient has had a recent decline in their functional status and demonstrates the ability to make significant improvements in function in a reasonable and predictable amount of time.     Precautions / Restrictions Precautions Precautions:  (Toe touch weightbearing on the right leg) Restrictions Weight Bearing Restrictions: Yes Other Position/Activity Restrictions: TTWBing right lower extremity      Mobility  Bed Mobility Overal bed mobility: Needs  Assistance Bed Mobility: Supine to Sit     Supine to sit: Mod assist, Min assist     General bed mobility comments: min to mod assist for supine to sit; needed assist with right leg and to move trunk fully upright to sitting position Patient Response: Cooperative  Transfers Overall transfer level: Needs assistance Equipment used: Rolling walker (2 wheels) Transfers: Sit to/from Stand, Bed to chair/wheelchair/BSC Sit to Stand: Min assist Stand pivot transfers: Min assist         General transfer comment: needs cues; reminders for toe touch weight bearing only right leg;  pivots on left leg and hops a couple times to back up to the chair pushing arms down on RW.    Ambulation/Gait                  Stairs            Wheelchair Mobility     Tilt Bed Tilt Bed Patient Response: Cooperative  Modified Rankin (Stroke Patients Only)       Balance Overall balance assessment: Needs assistance Sitting-balance support: Feet supported, Bilateral upper extremity supported Sitting balance-Leahy Scale: Good Sitting balance - Comments: good sitting balance on the edge of the bed   Standing balance support: Reliant on assistive device for balance, During functional activity, Bilateral upper extremity supported Standing balance-Leahy Scale: Fair Standing balance comment: toe touch weight bearing on right leg                             Pertinent Vitals/Pain Pain Assessment Pain Assessment: 0-10 Pain Score: 4  Pain Location: right hip Pain Descriptors / Indicators: Aching Pain Intervention(s): Monitored during session, Repositioned    Home Living Family/patient expects to be discharged to:: Private residence Living Arrangements: Alone Available Help at Discharge: Family;Friend(s);Available 24 hours/day Type of Home: House Home Access: Stairs to enter Entrance Stairs-Rails: Left;Right;Can reach both Entrance Stairs-Number of Steps: 3   Home Layout:  Laundry or work area in basement;Able to live on main level with bedroom/bathroom Home Equipment: Grab bars - tub/shower      Prior Function Prior Level of Function : Independent/Modified Independent             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, museum/gallery Extremity Assessment Upper Extremity Assessment: Defer to OT evaluation    Lower Extremity Assessment Lower Extremity Assessment: RLE deficits/detail RLE Deficits / Details: Toe touch weightbearing right leg    Cervical / Trunk Assessment Cervical / Trunk Assessment: Normal  Communication   Communication Communication: No apparent difficulties  Cognition Arousal: Alert Behavior During Therapy: WFL for tasks assessed/performed Overall Cognitive Status: Within Functional Limits for tasks assessed  General Comments      Exercises     Assessment/Plan    PT Assessment Patient needs continued PT services  PT Problem List Decreased strength;Decreased range of motion;Decreased activity tolerance;Decreased balance;Decreased mobility;Pain       PT Treatment Interventions DME instruction;Gait training;Stair training;Functional mobility training;Therapeutic activities;Therapeutic exercise;Balance training;Patient/family education    PT Goals (Current goals can be found in the Care Plan section)  Acute Rehab PT Goals Patient Stated Goal: return home PT Goal Formulation: With patient Time For Goal Achievement: 04/01/23 Potential to Achieve Goals: Good    Frequency Min 3X/week     Co-evaluation PT/OT/SLP Co-Evaluation/Treatment: Yes Reason for Co-Treatment: To address functional/ADL transfers PT goals addressed during session: Mobility/safety with mobility;Balance;Proper use of DME         AM-PAC PT "6 Clicks" Mobility  Outcome Measure Help needed turning from your back to your side while in a flat  bed without using bedrails?: A Little Help needed moving from lying on your back to sitting on the side of a flat bed without using bedrails?: A Little Help needed moving to and from a bed to a chair (including a wheelchair)?: A Lot Help needed standing up from a chair using your arms (e.g., wheelchair or bedside chair)?: A Lot Help needed to walk in hospital room?: A Lot Help needed climbing 3-5 steps with a railing? : Total 6 Click Score: 13    End of Session Equipment Utilized During Treatment: Gait belt Activity Tolerance: Patient tolerated treatment well Patient left: in chair;with call bell/phone within reach;with chair alarm set Nurse Communication: Mobility status;Weight bearing status PT Visit Diagnosis: Unsteadiness on feet (R26.81);Other abnormalities of gait and mobility (R26.89);Pain Pain - Right/Left: Right Pain - part of body: Hip    Time: 6213-0865 PT Time Calculation (min) (ACUTE ONLY): 20 min   Charges:   PT Evaluation $PT Eval Low Complexity: 1 Low   PT General Charges $$ ACUTE PT VISIT: 1 Visit         9:25 AM, 03/19/23 Teyona Nichelson Small Rilyn Upshaw MPT [place] [facility] [place] [phone] [phone]

## 2023-03-19 NOTE — TOC Transition Note (Signed)
Transition of Care [facility]) - CM/SW Discharge Note   Patient Details  Name: Monica Bell MRN: [id number] Date of Birth: [birth year]  Transition of Care [facility]) CM/SW Contact:  Elliot Gault, LCSW Phone Number: 03/19/2023, 10:01 AM   Clinical Narrative:     Pt medically stable for dc per MD. Sherron Monday with pt who reports that she wants to return home with [facility] and DME. She is not interested in SNF rehab.   CMS provider options for [facility] and DME reviewed with pt and referred as requested. [facility] will contact pt to schedule visits. DME will be delivered to pt's room prior to dc.  No other TOC needs for dc.  Final next level of care: Home w Home Health Services Barriers to Discharge: Barriers Resolved   Patient Goals and CMS Choice CMS Medicare.gov Compare Post Acute Care list provided to:: Patient Choice offered to / list presented to : Patient  Discharge Placement                         Discharge Plan and Services Additional resources added to the After Visit Summary for   In-house Referral: Clinical Social Work   Post Acute Care Choice: Durable Medical Equipment, Home Health          DME Arranged: 3-N-1, Walker rolling DME Agency: [employer] Date DME Agency Contacted: 03/19/23   Representative spoke with at DME Agency: Ian Malkin HH Arranged: PT, OT HH Agency: [facility] Date [facility] Agency Contacted: 03/19/23   Representative spoke with at [facility] Agency: Kandee Keen  Social Determinants of Health (SDOH) Interventions SDOH Screenings   Food Insecurity: No Food Insecurity (03/16/2023)  Housing: Low Risk  (03/16/2023)  Transportation Needs: No Transportation Needs (03/16/2023)  Utilities: Not At Risk (03/16/2023)  Depression (PHQ2-9): Low Risk  (09/03/2021)  Financial Resource Strain: Low Risk  (03/11/2023)   Received from [facility]  Tobacco Use: High Risk (03/18/2023)     Readmission Risk Interventions     No data to display

## 2023-03-19 NOTE — Plan of Care (Signed)
  Problem: Acute Rehab OT Goals (only OT should resolve) Goal: Pt. Will Perform Grooming Flowsheets (Taken 03/19/2023 0953) Pt Will Perform Grooming:  standing  with min assist  with contact guard assist Goal: Pt. Will Perform Lower Body Bathing Flowsheets (Taken 03/19/2023 0953) Pt Will Perform Lower Body Bathing:  with modified independence  sitting/lateral leans Goal: Pt. Will Perform Lower Body Dressing Flowsheets (Taken 03/19/2023 0953) Pt Will Perform Lower Body Dressing:  with modified independence  sitting/lateral leans Goal: Pt. Will Transfer To Toilet Flowsheets (Taken 03/19/2023 [phone]) Pt Will Transfer to Toilet:  with modified independence  stand pivot transfer  Linsey Arteaga OT, MOT

## 2023-03-19 NOTE — Evaluation (Signed)
Occupational Therapy Evaluation Patient Details Name: Monica Bell MRN: [id number] DOB: 1961/03/21 Today's Date: 03/19/2023   History of Present Illness Expand All Collapse All    untitled image  History and Physical   Monica MICA [id number] DOB: [birth year] DOA: 03/16/2023  PCP: [name], NP   Chief Complaint: fall, hip pain  Historian: patient, daughter     HPI:   Monica Bell is a 62 y.o. female with a PMH significant for diabetes, hypertension, and hypothyroidism.  At baseline, they live at home alone and are completely independent with ADLs.     They presented from home to the ED on 03/16/2023 with fall from 3-4 level stairs to a concrete floor this morning resulting in acute right hip pain.  Immediately following the accident, she was alert and oriented without any loss of consciousness and was able to pull herself to a standing position.  For a short period of time she was able to ambulate and even walked up and down the stairs again after her fall.  She then laid down in her bed and was unable to get back up again.  This is when she called her daughter and EMS.  Denies any prior bone fractures.  She did not take any of her home medications this morning and has not eaten or drank anything today.     In the ED, it was found that they had stable vital signs with the exception of moderate hypertension.   Significant findings included: WBC 16.2, hemoglobin 11, platelets 369.  Na+ 139, K+ 4.1, glucose 167, creatinine 1.22 (at baseline)  Lumbar x-ray showing mild degenerative changes but no acute abnormalities  Right hip and pelvis x-ray: Mildly displaced proximal right femoral neck fracture  CT pelvis completed and read is pending at this time     They were initially treated with analgesia, Zofran.  Orthopedic surgery has been consulted and plans to take to the OR Thursday morning.     Patient was admitted to medicine service for further workup and management of right hip  fracture as outlined in detail below.   Clinical Impression   Pt agreeable to OT and PT co-evaluation. Pt is independent at baseline without use of AD. Pt required RW and min A for transfer to the chair today. Cuing needed to maintain toe touch weight bearing status. Min to mod A for bed mobility. Pt likely needing min to mod A for lower body dressing. Pt desires to return home. BSC recommended since pt reports no DME at home. Pt left in the chair with chair alarm set and call bell within reach. Pt will benefit from continued OT in the hospital and recommended venue below to increase strength, balance, and endurance for safe ADL's.         If plan is discharge home, recommend the following: A little help with walking and/or transfers;A little help with bathing/dressing/bathroom;Assistance with feeding;Assist for transportation;Help with stairs or ramp for entrance    Functional Status Assessment  Patient has had a recent decline in their functional status and demonstrates the ability to make significant improvements in function in a reasonable and predictable amount of time.  Equipment Recommendations  BSC/3in1    Recommendations for Other Services       Precautions / Restrictions Precautions Precautions: Fall Restrictions Weight Bearing Restrictions: Yes RLE Weight Bearing: Touchdown weight bearing Other Position/Activity Restrictions: TTWBing right lower extremity      Mobility Bed Mobility Overal bed mobility: Needs Assistance  Bed Mobility: Supine to Sit     Supine to sit: Min assist, Mod assist     General bed mobility comments: Assist to move R LE to EOB and single hand held assist to pull to sit.    Transfers Overall transfer level: Needs assistance Equipment used: Rolling walker (2 wheels) Transfers: Sit to/from Stand, Bed to chair/wheelchair/BSC Sit to Stand: Min assist Stand pivot transfers: Min assist         General transfer comment: Cuing for toe touch  weight bearing and proper use of RW.      Balance Overall balance assessment: Needs assistance Sitting-balance support: Feet supported, Bilateral upper extremity supported Sitting balance-Leahy Scale: Good Sitting balance - Comments: good sitting balance on the edge of the bed   Standing balance support: Reliant on assistive device for balance, During functional activity, Bilateral upper extremity supported Standing balance-Leahy Scale: Fair Standing balance comment: using RW                           ADL either performed or assessed with clinical judgement   ADL Overall ADL's : Needs assistance/impaired     Grooming: Set up;Sitting       Lower Body Bathing: Moderate assistance;Sitting/lateral leans       Lower Body Dressing: Minimal assistance;Moderate assistance;Sitting/lateral leans Lower Body Dressing Details (indicate cue type and reason): Able reach L LE while seated in the recliner. Likely assist for R LE. Toilet Transfer: Minimal assistance;Stand-pivot;Rolling walker (2 wheels) Toilet Transfer Details (indicate cue type and reason): Simulated via EOB to chair with RW Toileting- Clothing Manipulation and Hygiene: Set up;Sitting/lateral lean       Functional mobility during ADLs: Minimal assistance;Rolling walker (2 wheels)       Vision Baseline Vision/History: 1 Wears glasses Ability to See in Adequate Light: 0 Adequate Patient Visual Report: No change from baseline Vision Assessment?: Wears glasses for reading     Perception Perception: Not tested       Praxis Praxis: Not tested       Pertinent Vitals/Pain Pain Assessment Pain Assessment: 0-10 Pain Score: 4  Pain Location: right hip Pain Descriptors / Indicators: Aching Pain Intervention(s): Limited activity within patient's tolerance, Monitored during session, Repositioned     Extremity/Trunk Assessment Upper Extremity Assessment Upper Extremity Assessment: Overall WFL for tasks  assessed   Lower Extremity Assessment Lower Extremity Assessment: Defer to PT evaluation RLE Deficits / Details: Toe touch weightbearing right leg   Cervical / Trunk Assessment Cervical / Trunk Assessment: Normal   Communication Communication Communication: No apparent difficulties   Cognition Arousal: Alert Behavior During Therapy: WFL for tasks assessed/performed Overall Cognitive Status: Within Functional Limits for tasks assessed                                                        Home Living Family/patient expects to be discharged to:: Private residence Living Arrangements: Alone Available Help at Discharge: Family;Friend(s);Available 24 hours/day Type of Home: House Home Access: Stairs to enter [employer] of Steps: 3 Entrance Stairs-Rails: Left;Right;Can reach both Home Layout: Laundry or work area in basement;Able to live on main level with bedroom/bathroom     Bathroom Shower/Tub: Chief Strategy Officer: Handicapped height Bathroom Accessibility: Yes How Accessible: Accessible via wheelchair;Accessible via walker  Home Equipment: Grab bars - tub/shower          Prior Functioning/Environment Prior Level of Function : Independent/Modified Independent             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 Comments: independent        OT Problem List: Decreased strength;Decreased activity tolerance;Decreased range of motion;Impaired balance (sitting and/or standing)      OT Treatment/Interventions: Self-care/ADL training;Therapeutic exercise;DME and/or AE instruction;Therapeutic activities;Patient/family education;Balance training    OT Goals(Current goals can be found in the care plan section) Acute Rehab OT Goals Patient Stated Goal: return home OT Goal Formulation: With patient Time For Goal Achievement: 04/02/23 Potential to Achieve Goals: Good  OT Frequency: Min 2X/week     Co-evaluation PT/OT/SLP Co-Evaluation/Treatment: Yes Reason for Co-Treatment: To address functional/ADL transfers PT goals addressed during session: Mobility/safety with mobility;Balance;Proper use of DME OT goals addressed during session: ADL's and self-care                       End of Session Equipment Utilized During Treatment: Gait belt;Rolling walker (2 wheels)  Activity Tolerance: Patient tolerated treatment well Patient left: in bed;with call bell/phone within reach;with chair alarm set  OT Visit Diagnosis: Unsteadiness on feet (R26.81);Other abnormalities of gait and mobility (R26.89);Muscle weakness (generalized) (M62.81);History of falling (Z91.81)                Time: 1610-9604 OT Time Calculation (min): 16 min Charges:  OT General Charges $OT Visit: 1 Visit OT Evaluation $OT Eval Low Complexity: 1 Low  Damian Hofstra OT, MOT  Danie Chandler 03/19/2023, 9:49 AM

## 2023-03-19 NOTE — Discharge Instructions (Signed)
IMPORTANT INFORMATION: PAY CLOSE ATTENTION   PHYSICIAN DISCHARGE INSTRUCTIONS  Follow with Primary care provider  Monica Downer, NP  and other consultants as instructed by your Hospitalist Physician  SEEK MEDICAL CARE OR RETURN TO EMERGENCY ROOM IF SYMPTOMS COME BACK, WORSEN OR NEW PROBLEM DEVELOPS   Please note: You were cared for by a hospitalist during your hospital stay. Every effort will be made to forward records to your primary care provider.  You can request that your primary care provider send for your hospital records if they have not received them.  Once you are discharged, your primary care physician will handle any further medical issues. Please note that NO REFILLS for any discharge medications will be authorized once you are discharged, as it is imperative that you return to your primary care physician (or establish a relationship with a primary care physician if you do not have one) for your post hospital discharge needs so that they can reassess your need for medications and monitor your lab values.  Please get a complete blood count and chemistry panel checked by your Primary MD at your next visit, and again as instructed by your Primary MD.  Get Medicines reviewed and adjusted: Please take all your medications with you for your next visit with your Primary MD  Laboratory/radiological data: Please request your Primary MD to go over all hospital tests and procedure/radiological results at the follow up, please ask your primary care provider to get all Hospital records sent to his/her office.  In some cases, they will be blood work, cultures and biopsy results pending at the time of your discharge. Please request that your primary care provider follow up on these results.  If you are diabetic, please bring your blood sugar readings with you to your follow up appointment with primary care.    Please call and make your follow up appointments as soon as possible.    Also  Note the following: If you experience worsening of your admission symptoms, develop shortness of breath, life threatening emergency, suicidal or homicidal thoughts you must seek medical attention immediately by calling 911 or calling your MD immediately  if symptoms less severe.  You must read complete instructions/literature along with all the possible adverse reactions/side effects for all the Medicines you take and that have been prescribed to you. Take any new Medicines after you have completely understood and accpet all the possible adverse reactions/side effects.   Do not drive when taking Pain medications or sleeping medications (Benzodiazepines)  Do not take more than prescribed Pain, Sleep and Anxiety Medications. It is not advisable to combine anxiety,sleep and pain medications without talking with your primary care practitioner  Special Instructions: If you have smoked or chewed Tobacco  in the last 2 yrs please stop smoking, stop any regular Alcohol  and or any Recreational drug use.  Wear Seat belts while driving.  Do not drive if taking any narcotic, mind altering or controlled substances or recreational drugs or alcohol.

## 2023-03-19 NOTE — Progress Notes (Addendum)
   ORTHOPAEDIC PROGRESS NOTE  S/p Right Hip CRPP  DOS: 03/18/2023  SUBJECTIVE: She states she had a good night.  Pain is controlled with Tylenol.  She was able to get some rest.   She is looking forward to going home.  She is ready to work with PT/OT  OBJECTIVE: PE:  Alert and oriented, no acute distress  Right hip without deformity Dressing is clean, dry and intact.  No strike through Toes are warm and well perfused Sensation intact on the dorsum of the foot Active motion of the EHL and TA intact  Vitals:   03/18/23 2234 03/19/23 0616  BP:  (!) 132/55  Pulse:  65  Resp:  16  Temp:  97.9 F (36.6 C)  SpO2: 97% 100%       Latest Ref Rng & Units 03/19/2023    4:30 AM 03/18/2023    4:22 AM 03/17/2023    4:16 AM  CBC  WBC 4.0 - 10.5 K/uL 17.2  14.9  16.3   Hemoglobin 12.0 - 15.0 g/dL 9.7  34.7  42.5   Hematocrit 36.0 - 46.0 % 31.8  33.2  35.6   Platelets 150 - 400 K/uL 329  352  373     ASSESSMENT: Monica Bell is a 62 y.o. female doing well.  Pain is controlled without narcotics.  PT/OT eval pending  PLAN: Weightbearing: TTWB RLE, walker at all times Incisional and dressing care: Reinforce dressings as needed Orthopedic device(s): None VTE prophylaxis: Recommend Aspirin 81mg  BID; to begin POD#1.  Early mobilization.  Bilateral SCDs Pain control: PO pain medications as needed; judicious use of narcotics Follow - up plan: 2 weeks   Contact information:     Ugochukwu Chichester A. Dallas Schimke, MD MS [facility] [address] [place],  [place]  [address] Phone: [phone] Fax: [phone]

## 2023-03-19 NOTE — Plan of Care (Signed)
  Problem: Acute Rehab PT Goals(only PT should resolve) Goal: Pt Will Go Supine/Side To Sit Outcome: Progressing Flowsheets (Taken 03/19/2023 0925) Pt will go Supine/Side to Sit: with minimal assist Goal: Patient Will Transfer Sit To/From Stand Outcome: Progressing Flowsheets (Taken 03/19/2023 0925) Patient will transfer sit to/from stand: with contact guard assist Goal: Pt Will Transfer Bed To Chair/Chair To Bed Outcome: Progressing Flowsheets (Taken 03/19/2023 0925) Pt will Transfer Bed to Chair/Chair to Bed: with contact guard assist Goal: Pt Will Ambulate Outcome: Progressing Flowsheets (Taken 03/19/2023 0925) Pt will Ambulate:  15 feet  with moderate assist  with rolling walker

## 2023-03-23 ENCOUNTER — Encounter (HOSPITAL_COMMUNITY): Payer: Self-pay | Admitting: Orthopedic Surgery

## 2023-03-24 ENCOUNTER — Telehealth: Payer: Self-pay | Admitting: Orthopedic Surgery

## 2023-03-24 NOTE — Telephone Encounter (Signed)
Dr. Dallas Schimke pt - Monica Bell PT w/[facility] [phone] lvm stating she needs clarification on the pt's bandages, should be covered or not and to make sure she has a f/u appt, she does, it's 03/31/23 at 11:00am.

## 2023-03-31 ENCOUNTER — Ambulatory Visit: Payer: BC Managed Care – PPO | Admitting: Orthopedic Surgery

## 2023-03-31 ENCOUNTER — Encounter: Payer: Self-pay | Admitting: Orthopedic Surgery

## 2023-03-31 ENCOUNTER — Other Ambulatory Visit (INDEPENDENT_AMBULATORY_CARE_PROVIDER_SITE_OTHER): Payer: Self-pay

## 2023-03-31 VITALS — BP 149/69 | HR 80 | Ht 71.0 in | Wt 197.0 lb

## 2023-03-31 DIAGNOSIS — Z9889 Other specified postprocedural states: Secondary | ICD-10-CM

## 2023-03-31 DIAGNOSIS — S72001D Fracture of unspecified part of neck of right femur, subsequent encounter for closed fracture with routine healing: Secondary | ICD-10-CM

## 2023-03-31 NOTE — Progress Notes (Signed)
Orthopaedic Postop Note  Assessment: Monica Bell is a 62 y.o. female s/p CRPP of Right nondisplaced femoral neck fracture  DOS: 03/18/2023  Plan: Sutures trimmed, steri strips placed Continue with protective WBAT, can advance WB Continue with DVT prophylaxis for at least 6 weeks after surgery WBAT on the operative extremity Follow up in 4 weeks; call with any issues   Follow-up: No follow-ups on file. XR at next visit: AP pelvis and Right hip  Subjective:  Chief Complaint  Patient presents with   Routine Post Op    R hip DOS 03/18/23    History of Present Illness: Monica Bell is a 62 y.o. female who presents following the above stated procedure.  Surgery was approximately 2 weeks ago.  She is progressing appropriately.  She has been limiting her weightbearing.  She has occasional pain in the right groin.  She is working with home health physical therapy.  She uses a walker.  She is not taking any for pain.  She continues to take aspirin.  Review of Systems: No fevers or chills No numbness or tingling No Chest Pain No shortness of breath   Objective: BP (!) 149/69   Pulse 80   Ht 5\' 11"  (1.803 m)   Wt 197 lb (89.4 kg)   BMI 27.48 kg/m   Physical Exam:  Alert and oriented.  No acute distress.  Seated in wheelchair.  Surgical incisions are healing well.  No surrounding erythema or drainage.  Able to maintain a straight leg raise.  Active motion intact in the TA/EHL.  Toes are warm and well-perfused.  Tolerates gentle range of motion in the right hip with some pain in the groin.    IMAGING: I personally ordered and reviewed the following images:  XR of the Right femur and AP pelvis demonstrates 3 cannulated screws traversing a nondisplaced fracture of the femoral neck.   The fracture remains in stable position.  No interval displacement of the fracture.  No migration of the screws within the femoral head.  There is no evidence of implant subsidence.   No acute fractures are noted.  Impression: Right nondisplaced femoral fracture in stable position without further displacement of the fracture  Oliver Barre, MD 03/31/2023 11:09 AM

## 2023-03-31 NOTE — Patient Instructions (Signed)
Okay to advance her weightbearing with physical therapy.  Continue with weightbearing as tolerated, using a walker.  Okay to advance from a walker to a cane.   Continue to work with physical therapy.  Walking will provide excellent therapy, in order to strengthen and heal the bone.

## 2023-04-13 ENCOUNTER — Ambulatory Visit: Payer: BC Managed Care – PPO | Admitting: Orthopedic Surgery

## 2023-04-15 ENCOUNTER — Telehealth: Payer: Self-pay

## 2023-04-15 NOTE — Telephone Encounter (Signed)
Patient call and left her name and number for a call back. I called her back just now and was told that her mailbox was full. She didn't say what she was needing.

## 2023-04-30 ENCOUNTER — Encounter: Payer: Self-pay | Admitting: Orthopedic Surgery

## 2023-04-30 ENCOUNTER — Other Ambulatory Visit (INDEPENDENT_AMBULATORY_CARE_PROVIDER_SITE_OTHER): Payer: Self-pay

## 2023-04-30 ENCOUNTER — Ambulatory Visit (INDEPENDENT_AMBULATORY_CARE_PROVIDER_SITE_OTHER): Payer: 59 | Admitting: Orthopedic Surgery

## 2023-04-30 DIAGNOSIS — S72001D Fracture of unspecified part of neck of right femur, subsequent encounter for closed fracture with routine healing: Secondary | ICD-10-CM

## 2023-04-30 DIAGNOSIS — Z9889 Other specified postprocedural states: Secondary | ICD-10-CM | POA: Diagnosis not present

## 2023-04-30 IMAGING — MG MM DIGITAL SCREENING BILAT W/ TOMO AND CAD
8 series · 8 of 24 positions shown · non-contrast
Comparison: Previous exam(s).

CLINICAL DATA: Screening.

EXAM:
DIGITAL SCREENING BILATERAL MAMMOGRAM WITH TOMOSYNTHESIS AND CAD
TECHNIQUE: Bilateral screening digital craniocaudal and mediolateral oblique
mammograms were obtained. Bilateral screening digital breast
tomosynthesis was performed. The images were evaluated with
computer-aided detection.

[L CC synth-2D]
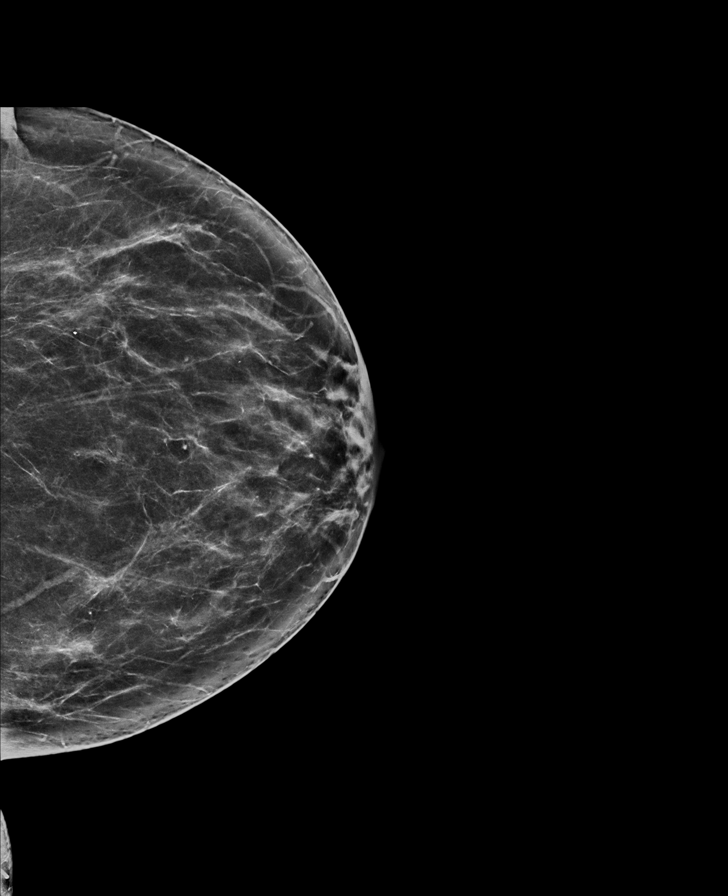

[R MLO synth-2D]
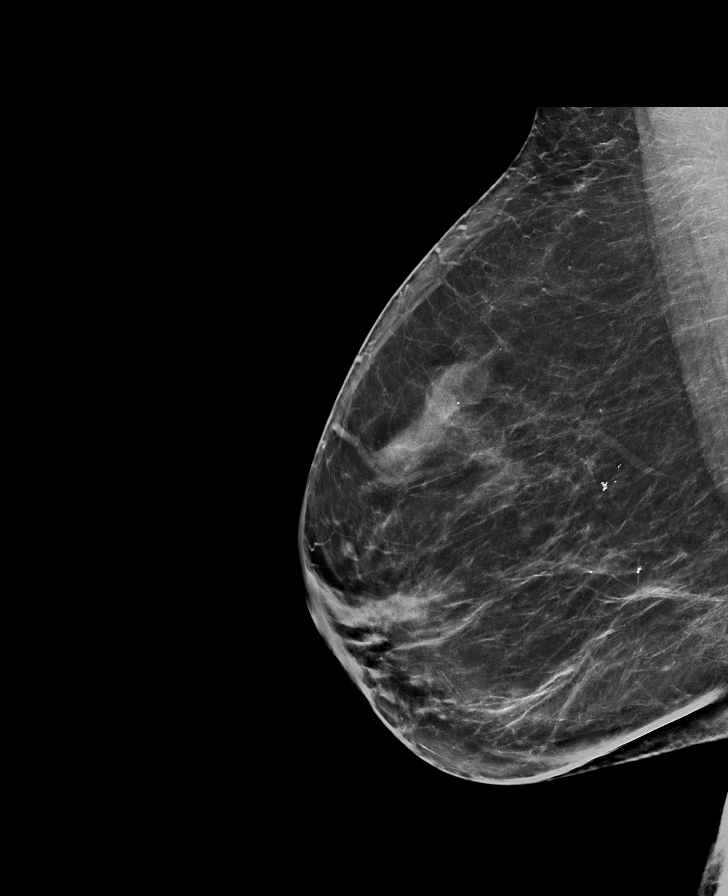

[L MLO synth-2D]
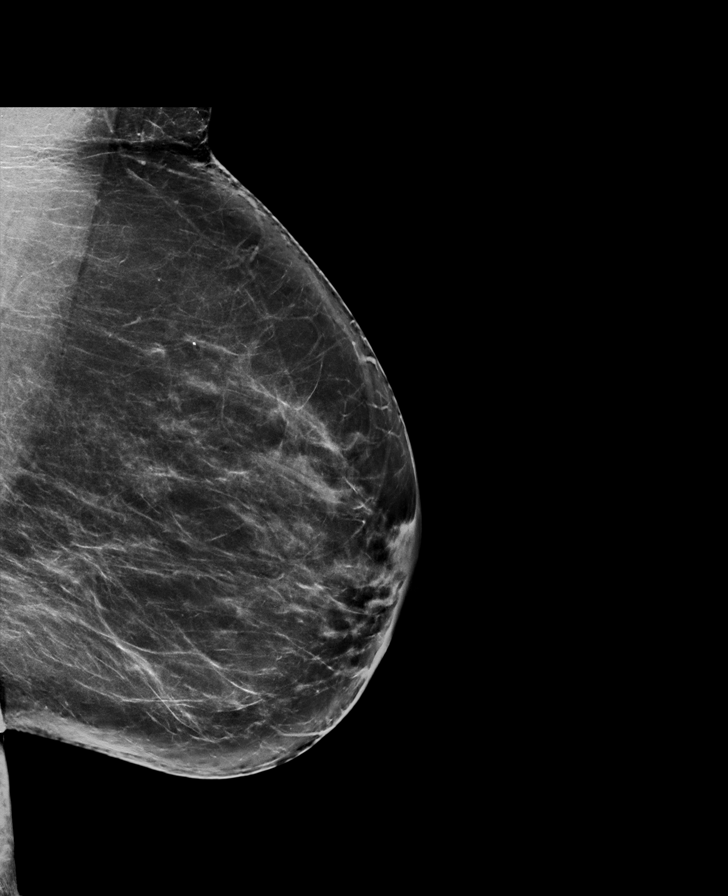

[R CC synth-2D]
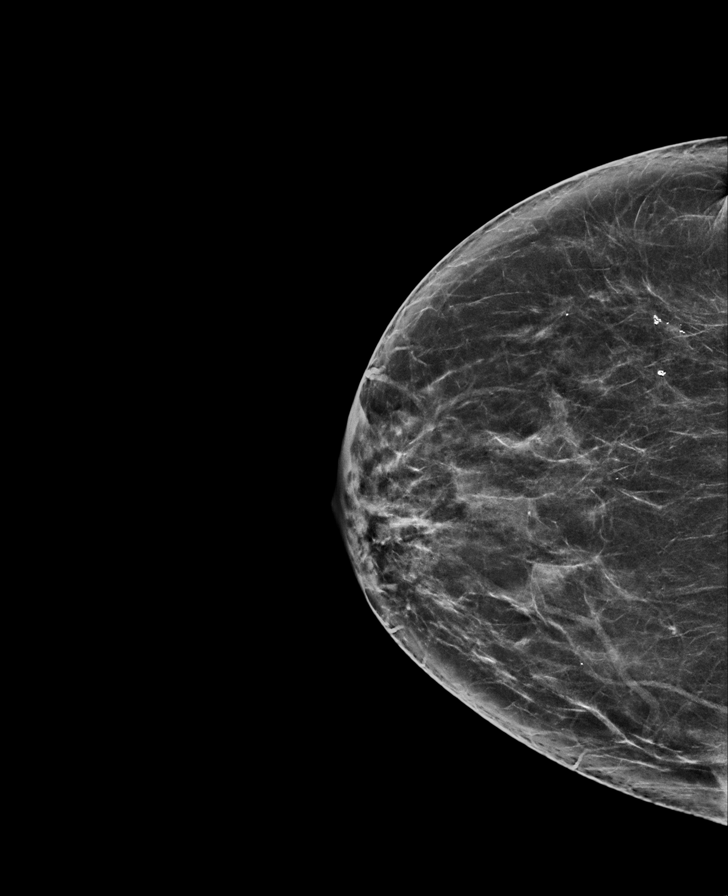

[R MLO tomo · tomo slice 40/79.0]
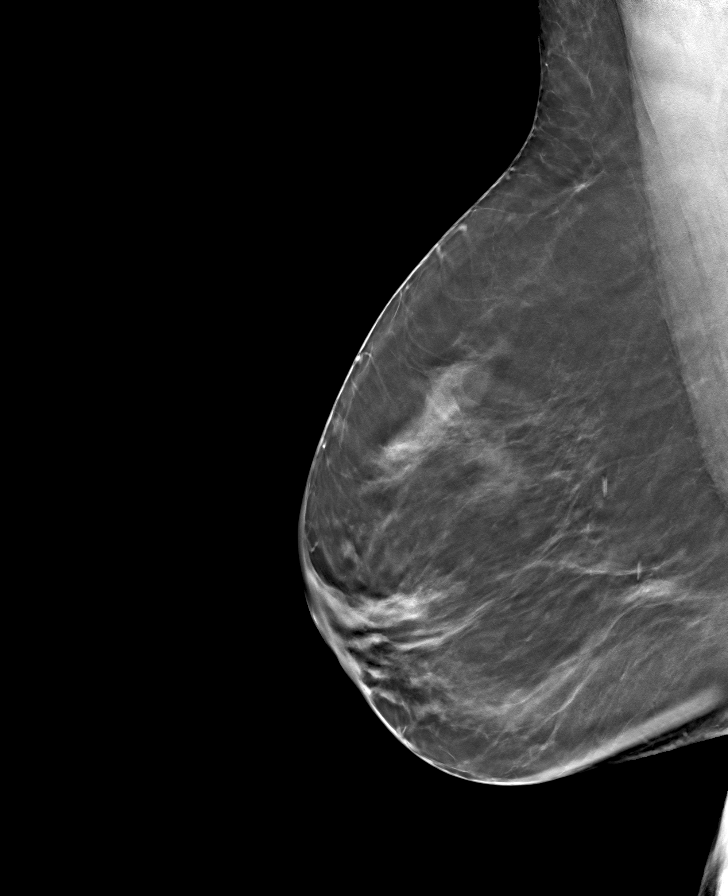

[R CC tomo · tomo slice 37/72.0]
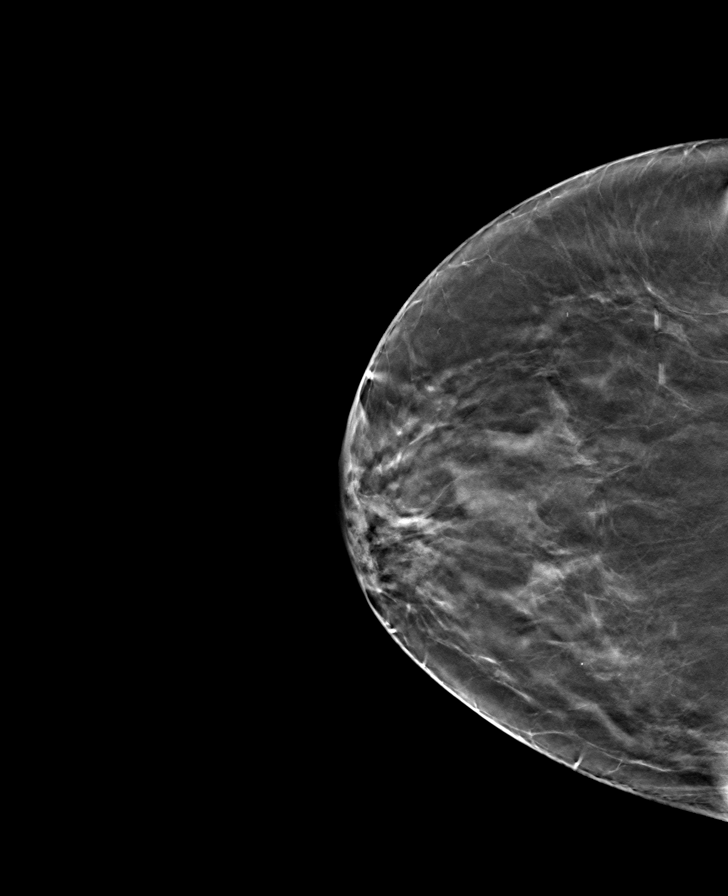

[L CC tomo · tomo slice 41/80.0]
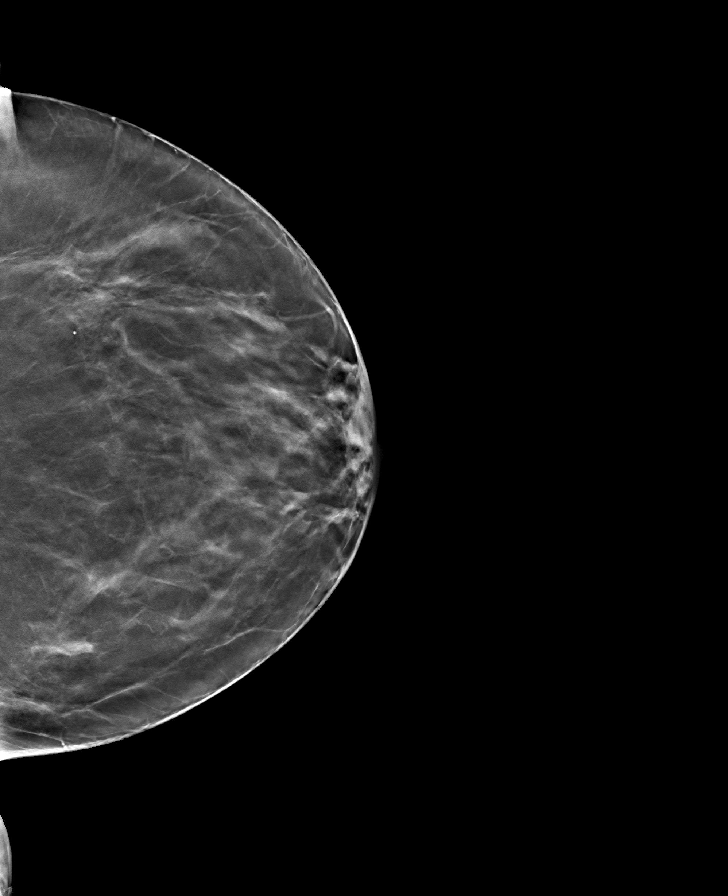

[L MLO tomo · tomo slice 43/85.0]
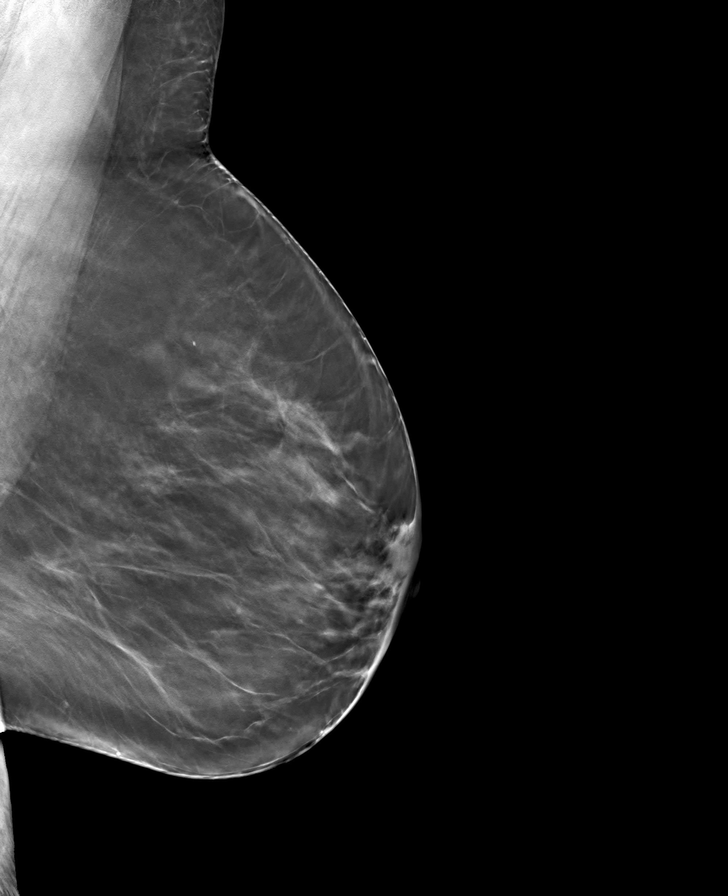

[8 of 24 positions shown; findings below may reference images not displayed]

ACR Breast Density Category b: There are scattered areas of
fibroglandular density.
FINDINGS: There are no findings suspicious for malignancy.
IMPRESSION: No mammographic evidence of malignancy. A result letter of this
screening mammogram will be mailed directly to the patient.

RECOMMENDATION:
Screening mammogram in one year. (Code:51-O-LD2)

BI-RADS CATEGORY  1: Negative.

## 2023-04-30 NOTE — Progress Notes (Signed)
 Orthopaedic Postop Note  Assessment: Monica Bell is a 63 y.o. female s/p CRPP of Right nondisplaced femoral neck fracture  DOS: 03/18/2023  Plan: Monica Bell is recovering well.  She is no longer taking medicines.  She does have some occasional pain in the groin.  She notes some stiffness when she starts to walk.  This is improving.  She is using a cane to assist with ambulation.  Urged her to continue working with therapy, continue walking to help strengthen the leg.  She states that she feels better on a daily basis.  She will follow-up in approximately 6 weeks.  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   Follow-up: Return in about 6 weeks (around 06/11/2023). XR at next visit: AP pelvis and Right hip  Subjective:  Chief Complaint  Patient presents with   Follow-up    Recheck on right hip, DOS 03/18/23.    History of Present Illness: Monica Bell is a 63 y.o. female who presents following the above stated procedure.  Surgery was approximately 6 weeks ago.  She has done well.  She is now ambulating with a cane.  Occasional pain in the groin.  She notes some stiffness when she gets up to walk.  She continues to work with home health therapy.  Review of Systems: No fevers or chills No numbness or tingling No Chest Pain No shortness of breath   Objective: There were no vitals taken for this visit.  Physical Exam:  Alert and oriented.  No acute distress.  Ambulates with a cane  Surgical incisions are healing well.  No surrounding erythema or drainage.  Able to maintain a straight leg raise.  Active motion intact in the TA/EHL.  Toes are warm and well-perfused.  Tolerates gentle range of motion in the right hip.   IMAGING: I personally ordered and reviewed the following images:  AP pelvis and right hip XR were obtained in clinic today.  These are compared to prior XR.  No change in alignment.  Screws are not backing out.  3 screws  traversing nondisplaced fracture of the femoral neck, without subsidence.  No bony lesions.  No AVN.  Impression: Stable right femoral neck fracture without hardware failure or subsidence  Oneil DELENA Horde, MD 04/30/2023 11:12 AM

## 2023-04-30 NOTE — Patient Instructions (Signed)
 Note for work.  Out of work until the next visit.

## 2023-05-03 IMAGING — DX DG CHEST 1V PORT
1 series · 1 of 1 positions shown · non-contrast
Comparison: July 23, 2013

CLINICAL DATA: Mid chest pain that radiates to right side in the
neck.

EXAM:
PORTABLE CHEST 1 VIEW

[chest ap]
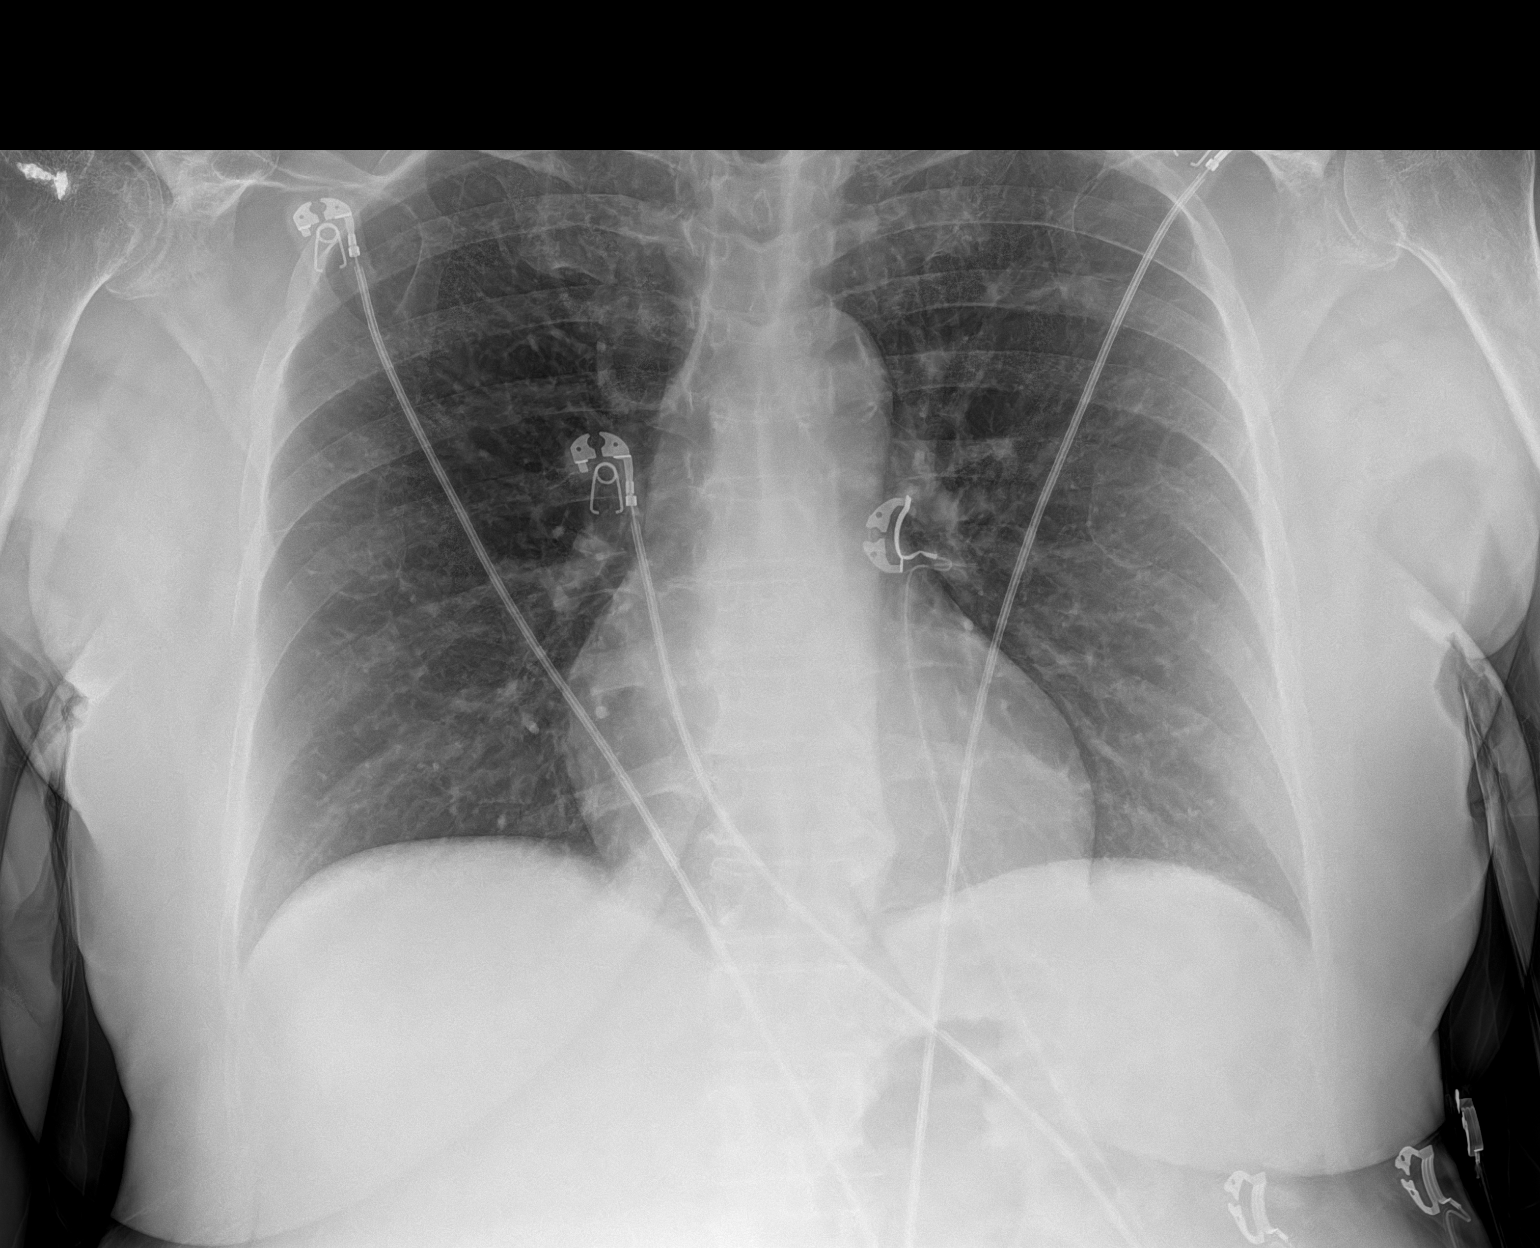

[1 of 1 positions shown; findings below may reference images not displayed]

FINDINGS: The heart size and mediastinal contours are within normal limits.
Aortic atherosclerosis. No focal consolidation. No pleural effusion.
No pneumothorax. Degenerative change of the shoulders. Surgical
anchors in the right humeral head.
IMPRESSION: 1. No acute cardiopulmonary findings.
2.  Aortic Atherosclerosis ([id number]).

## 2023-06-09 ENCOUNTER — Other Ambulatory Visit (INDEPENDENT_AMBULATORY_CARE_PROVIDER_SITE_OTHER): Payer: Self-pay

## 2023-06-09 ENCOUNTER — Encounter: Payer: Self-pay | Admitting: Orthopedic Surgery

## 2023-06-09 ENCOUNTER — Ambulatory Visit: Payer: 59 | Admitting: Orthopedic Surgery

## 2023-06-09 DIAGNOSIS — S72001D Fracture of unspecified part of neck of right femur, subsequent encounter for closed fracture with routine healing: Secondary | ICD-10-CM | POA: Diagnosis not present

## 2023-06-09 DIAGNOSIS — Z9889 Other specified postprocedural states: Secondary | ICD-10-CM | POA: Diagnosis not present

## 2023-06-09 NOTE — Progress Notes (Signed)
Orthopaedic Postop Note  Assessment: Monica Bell is a 63 y.o. female s/p CRPP of Right nondisplaced femoral neck fracture  DOS: 03/18/2023  Plan: [name] has done well.  Radiographs remained stable.  She states that she has occasional catching sensations in the groin, as well as the lateral hip.  These are not bad enough to require any medications.  She does not feel comfortable returning to work yet.  I have recommended physical therapy.  We will see her back in 6 weeks.  At that time, anticipate that she will be close to returning to work.   Follow-up: Return in about 6 weeks (around 07/21/2023). XR at next visit: AP pelvis and Right hip  Subjective:  Chief Complaint  Patient presents with   Routine Post Op    R hip DOS 03/18/23    History of Present Illness: Monica Bell is a 63 y.o. female who presents following the above stated procedure.  Surgery was approximately 3 months ago.  She continues to get better.  She is ambulating without assistive device.  She has occasional catching pains in the groin, as well as the lateral hip.  She does not take any medicines for this pain.  She also notes that she has been having some pain in the left arm.  She has stopped using a cane.  Review of Systems: No fevers or chills No numbness or tingling No Chest Pain No shortness of breath   Objective: There were no vitals taken for this visit.  Physical Exam:  Alert and oriented.  No acute distress.  Right sided antalgic gait without assistive device.  Lateral hip surgical incisions are healed.  No surrounding erythema or drainage.  No tenderness to palpation over the lateral hip.  She tolerates gentle range of motion of the right hip.  She has full range of motion of her knee.  She is able to maintain a straight leg raise.  Active motion intact in the EHL/TA.   IMAGING: I personally ordered and reviewed the following images:  AP pelvis and right hip x-rays were  obtained in clinic today.  These are compared to available x-rays.  No change in alignment overall.  Screws remain in good position.  No subsidence.  The screws are not backing out.  There is no screw cut out.  No bony lesions.  No callus formation.  Impression: Stable right femoral neck fracture, stabilized with 3 screws without hardware failure  Oliver Barre, MD 06/09/2023 2:51 PM

## 2023-06-09 NOTE — Patient Instructions (Signed)
Note for work - out until the next visit

## 2023-06-30 NOTE — Therapy (Signed)
 OUTPATIENT PHYSICAL THERAPY LOWER EXTREMITY EVALUATION   Patient Name: Monica Bell MRN: [id number] DOB:03-19-1961, 62 y.o., female Today's Date: 07/01/2023  END OF SESSION:  PT End of Session - 07/01/23 1017     Visit Number 1    Authorization Type [employer]    PT Start Time 1016    PT Stop Time 1100    PT Time Calculation (min) 44 min             Past Medical History:  Diagnosis Date   Abnormal mammogram    Allergic rhinitis due to pollen 08/01/2019   Arthritis 01/06/2019   osteoarthritis of both knees   BMI 28.0-28.9,adult 08/01/2019   Cystocele, unspecified (CODE)    Diabetes mellitus    GERD (gastroesophageal reflux disease)    Heart murmur    Hyperlipemia, mixed 12/14/2018   Hypertension    Hypothyroidism    Stress incontinence, female    Thyroid disease    Type 2 diabetes mellitus, without long-term current use of insulin (HCC) 01/06/2019   Vitamin D deficiency 01/06/2019   Past Surgical History:  Procedure Laterality Date   COLONOSCOPY WITH PROPOFOL N/A 03/18/2020   Procedure: COLONOSCOPY WITH PROPOFOL;  Surgeon: Regis Bill, MD;  Location: [facility];  Service: Endoscopy;  Laterality: N/A;   CORONARY BALLOON ANGIOPLASTY N/A 07/15/2021   Procedure: CORONARY BALLOON ANGIOPLASTY;  Surgeon: Elder Negus, MD;  Location: [facility];  Service: Cardiovascular;  Laterality: N/A;   CORONARY STENT INTERVENTION N/A 12/06/2020   Procedure: CORONARY STENT INTERVENTION;  Surgeon: Elder Negus, MD;  Location: [facility];  Service: Cardiovascular;  Laterality: N/A;   CORONARY STENT INTERVENTION N/A 07/15/2021   Procedure: CORONARY STENT INTERVENTION;  Surgeon: Elder Negus, MD;  Location: [facility];  Service: Cardiovascular;  Laterality: N/A;   CORONARY ULTRASOUND/IVUS N/A 12/06/2020   Procedure: Intravascular Ultrasound/IVUS;  Surgeon: Elder Negus, MD;  Location: [facility];  Service:  Cardiovascular;  Laterality: N/A;   CORONARY ULTRASOUND/IVUS N/A 07/15/2021   Procedure: Intravascular Ultrasound/IVUS;  Surgeon: Elder Negus, MD;  Location: [facility];  Service: Cardiovascular;  Laterality: N/A;   HIP PINNING,CANNULATED Right 03/18/2023   Procedure: PERCUTANEOUS FIXATION OF FEMORAL NECK;  Surgeon: Oliver Barre, MD;  Location: [facility];  Service: Orthopedics;  Laterality: Right;   LEFT HEART CATH AND CORONARY ANGIOGRAPHY N/A 12/06/2020   Procedure: LEFT HEART CATH AND CORONARY ANGIOGRAPHY;  Surgeon: Elder Negus, MD;  Location: [facility];  Service: Cardiovascular;  Laterality: N/A;   LEFT HEART CATH AND CORONARY ANGIOGRAPHY N/A 07/15/2021   Procedure: LEFT HEART CATH AND CORONARY ANGIOGRAPHY;  Surgeon: Elder Negus, MD;  Location: [facility];  Service: Cardiovascular;  Laterality: N/A;   ROTATOR CUFF REPAIR     THYROIDECTOMY     Patient Active Problem List   Diagnosis Date Noted   Hip fracture (HCC) 03/16/2023   Closed displaced fracture of right femoral neck (HCC) 03/16/2023   Fall 03/16/2023   Statin myopathy 11/16/2022   Pain in lower limb 10/12/2022   Plantar fasciitis 10/12/2022   Posterior calcaneal exostosis 10/12/2022   Cigarette nicotine dependence without complication 07/23/2022   Post PTCA 07/15/2021   Refractory angina (HCC)    Research subject EMPACT AMI Study: EMPAgliflozin in CHF patients post acute MI 07/03/2021   Primary hypertension 01/23/2021   Uncontrolled type 2 diabetes mellitus with hyperglycemia (HCC) 01/23/2021   Coronary artery  disease of native artery of native heart with stable angina pectoris (HCC) 12/10/2020   HFrEF (heart failure with reduced ejection fraction) (HCC) 12/10/2020   Chest pain 12/06/2020   Mild renal insufficiency 12/06/2020   Hypertensive crisis 12/06/2020   NSTEMI (non-ST elevated myocardial infarction) (HCC) 12/06/2020   Abnormal mammogram 05/17/2019   Diabetes mellitus  type 2, insulin dependent (HCC) 05/17/2019   GERD (gastroesophageal reflux disease) 05/17/2019   Heart murmur 05/17/2019   Mixed hyperlipidemia 05/17/2019   Chronic right shoulder pain 02/26/2012   Cystocele 02/26/2012   Stress incontinence, female 02/26/2012   Hypothyroidism 02/26/2012   Tobacco use disorder 02/26/2012    PCP: Cheron Every, NP  REFERRING PROVIDER: Oliver Barre, MD  REFERRING DIAG: [phone] (ICD-10-CM) - Status post hip surgery S72.001D (ICD-10-CM) - Closed fracture of neck of right femur with routine healing, subsequent encounter  THERAPY DIAG:  Pain in right hip  Difficulty in walking, not elsewhere classified  Rationale for Evaluation and Treatment: Rehabilitation  ONSET DATE: 03/16/23; s/p 03/18/23  SUBJECTIVE:   SUBJECTIVE STATEMENT: Had a fall down her basement steps 11/19; had to have surgery per Dr. Dallas Schimke 11/21; followed with Dallas Schimke latest visit referred to PT.   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 Yes: NPRS scale: 8/10 Pain location: right hip Pain description: stiffness Aggravating factors: sitting too long, laying on right side Relieving factors: moving  PRECAUTIONS: None  RED FLAGS: None   WEIGHT BEARING RESTRICTIONS: No  FALLS:  Has patient fallen in last 6 months? Yes. Number of falls 1  OCCUPATION: mail carrier  PLOF: Independent  PATIENT GOALS: return to work, walk without so much stiffness  NEXT MD VISIT: 07/21/23  OBJECTIVE:  Note: Objective measures were completed at Evaluation unless otherwise noted.  DIAGNOSTIC FINDINGS:   AP pelvis and right hip x-rays were obtained in clinic today.  These are compared to available x-rays.  No change in alignment overall.  Screws remain in good position.  No subsidence.  The screws are not backing out.  There is no screw cut out.  No bony lesions.  No callus formation.   Impression: Stable right femoral neck  fracture, stabilized with 3 screws without hardware failure  PATIENT SURVEYS:  LEFS 16/80 20%  COGNITION: Overall cognitive status: Within functional limits for tasks assessed     SENSATION: WFL  EDEMA:  No swelling recently   POSTURE: No Significant postural limitations  PALPATION: Mild tenderness right hip  LOWER EXTREMITY ROM:  Active ROM Right eval Left eval  Hip flexion    Hip extension    Hip abduction    Hip adduction    Hip internal rotation    Hip external rotation    Knee flexion    Knee extension    Ankle dorsiflexion    Ankle plantarflexion    Ankle inversion    Ankle eversion     (Blank rows = not tested)  LOWER EXTREMITY MMT:  MMT Right eval Left eval  Hip flexion 4 5  Hip extension    Hip abduction    Hip adduction    Hip internal rotation    Hip external rotation    Knee flexion    Knee extension 4 5  Ankle dorsiflexion 5 5  Ankle plantarflexion    Ankle inversion    Ankle eversion     (Blank rows = not tested) FUNCTIONAL TESTS:  5 times sit to stand:  53 sec using hands to assist Timed up and go (TUG): 21.09 sec  GAIT: Distance walked: 80 ft Assistive device utilized: None Level of assistance: Modified independence Comments: decreased stance right lower extremity                                                                                                                                TREATMENT DATE: 07/01/23 physical therapy evaluation and HEP instruction    PATIENT EDUCATION:  Education details: Patient educated on exam findings, POC, scope of PT, HEP, and what to expect next visit. Person educated: Patient Education method: Explanation, Demonstration, and Handouts Education comprehension: verbalized understanding, returned demonstration, verbal cues required, and tactile cues required    HOME EXERCISE PROGRAM: Access Code: [email or web address] URL: [email or web address] Date: 07/01/2023 Prepared by: [facility] -  Rehab  Exercises - Sit to Stand  - 2 x daily - 7 x weekly - 2 sets - 5 reps - Supine Quad Set  - 2 x daily - 7 x weekly - 2 sets - 10 reps - 5 sec hold - Hooklying Clamshell with Resistance  - 2 x daily - 7 x weekly - 2 sets - 10 reps - Supine Bridge  - 2 x daily - 7 x weekly - 1 sets - 10 reps  ASSESSMENT:  CLINICAL IMPRESSION: Patient is a 63 y.o. female who was seen today for physical therapy evaluation and treatment for Z98.890 (ICD-10-CM) - Status post hip surgery S72.001D (ICD-10-CM) - Closed fracture of neck of right femur with routine healing, subsequent encounter.  Patient demonstrates muscle weakness, reduced ROM, and fascial restrictions which are likely contributing to symptoms of pain and are negatively impacting patient ability to perform ADLs and functional mobility tasks. Patient will benefit from skilled physical therapy services to address these deficits to reduce pain and improve level of function with ADLs and functional mobility tasks.   OBJECTIVE IMPAIRMENTS: Abnormal gait, decreased activity tolerance, decreased endurance, decreased mobility, difficulty walking, decreased strength, impaired perceived functional ability, and pain.   ACTIVITY LIMITATIONS: carrying, lifting, bending, sitting, standing, squatting, sleeping, stairs, transfers, bed mobility, and locomotion level  PARTICIPATION LIMITATIONS: meal prep, cleaning, laundry, shopping, community activity, and occupation  REHAB POTENTIAL: Good  CLINICAL DECISION MAKING: Evolving/moderate complexity  EVALUATION COMPLEXITY: Moderate   GOALS: Goals reviewed with patient? No  SHORT TERM GOALS: Target date: 07/15/23 patient will be independent with initial HEP  Baseline: Goal status: INITIAL  2.  Patient will report 50% improvement overall  Baseline:  Goal status: INITIAL   LONG TERM GOALS: Target date: 07/29/23  Patient will be independent in self management strategies to improve quality of life and  functional outcomes.  Baseline:  Goal status: INITIAL  2.  Patient will report 75% improvement overall  Baseline:  Goal status: INITIAL  3.  Patient will improve LEFS score by 20 points to demonstrate improved perceived function  Baseline: 16/80 Goal status: INITIAL  4.  Patient will increase right leg MMT's to 5/5 to allow navigation of steps without gait deviation or loss of balance   Baseline: see above Goal status: INITIAL  5.   Patient will improve 5 times sit to stand score to 20 sec or less to demonstrate improved functional mobility and increased leg strength.    Baseline: 53 sec Goal status: INITIAL  6.  Patient will improve TUG score to 14 sec or less to demonstrate decreased fall risk and Improved functional mobility Baseline: 21.09 sec Goal status: INITIAL   PLAN:  PT FREQUENCY: 1x/week  PT DURATION: 4 weeks  PLANNED INTERVENTIONS: 97164- PT Re-evaluation, 97110-Therapeutic exercises, 97530- Therapeutic activity, 97112- Neuromuscular re-education, 97535- Self Care, 16109- Manual therapy, [phone]- Gait training, [phone]- Orthotic Fit/training, [phone]- Canalith repositioning, [id number]- Aquatic Therapy, [phone]- Splinting, Patient/Family education, Balance training, Stair training, Taping, Dry Needling, Joint mobilization, Joint manipulation, Spinal manipulation, Spinal mobilization, Scar mobilization, and DME instructions.   PLAN FOR NEXT SESSION: Review HEP and goals; patient coming 1 x a week per her request and [facility]   11:08 AM, 07/01/23 Santos Sollenberger Small Ashiya Kinkead MPT [facility] [phone] Ph:[phone]

## 2023-07-01 ENCOUNTER — Ambulatory Visit (HOSPITAL_COMMUNITY): Payer: 59 | Attending: Orthopedic Surgery

## 2023-07-01 ENCOUNTER — Other Ambulatory Visit: Payer: Self-pay

## 2023-07-01 DIAGNOSIS — Z9889 Other specified postprocedural states: Secondary | ICD-10-CM | POA: Insufficient documentation

## 2023-07-01 DIAGNOSIS — M25551 Pain in right hip: Secondary | ICD-10-CM | POA: Diagnosis present

## 2023-07-01 DIAGNOSIS — S72001D Fracture of unspecified part of neck of right femur, subsequent encounter for closed fracture with routine healing: Secondary | ICD-10-CM | POA: Insufficient documentation

## 2023-07-01 DIAGNOSIS — R262 Difficulty in walking, not elsewhere classified: Secondary | ICD-10-CM | POA: Diagnosis present

## 2023-07-06 ENCOUNTER — Encounter: Payer: Self-pay | Admitting: Orthopedic Surgery

## 2023-07-15 ENCOUNTER — Encounter (HOSPITAL_COMMUNITY)

## 2023-07-15 ENCOUNTER — Telehealth (HOSPITAL_COMMUNITY): Payer: Self-pay

## 2023-07-15 NOTE — Telephone Encounter (Signed)
No show #1, called and left message concerning missed apt today.  Reminded next apt date and time with contact number included if needs to cancel/reschedule in the future.    Ihor Austin, LPTA/CLT; Delana Meyer [phone]

## 2023-07-21 ENCOUNTER — Encounter: Payer: 59 | Admitting: Orthopedic Surgery

## 2023-07-21 ENCOUNTER — Encounter (HOSPITAL_COMMUNITY)

## 2023-07-21 ENCOUNTER — Telehealth (HOSPITAL_COMMUNITY): Payer: Self-pay

## 2023-07-21 NOTE — Telephone Encounter (Signed)
 No show #2, called and spoke to pt who stated she does not feel she needs therapy and wishes for DC. All future apts canceled, instructed to contact MD for a new referral if feels need for therapy.   Becky Sax, LPTA/CLT; Rowe Clack [phone]

## 2023-07-23 ENCOUNTER — Ambulatory Visit: Admitting: Orthopedic Surgery

## 2023-07-23 ENCOUNTER — Encounter: Payer: Self-pay | Admitting: Orthopedic Surgery

## 2023-07-23 ENCOUNTER — Other Ambulatory Visit (INDEPENDENT_AMBULATORY_CARE_PROVIDER_SITE_OTHER): Payer: Self-pay

## 2023-07-23 DIAGNOSIS — S72001D Fracture of unspecified part of neck of right femur, subsequent encounter for closed fracture with routine healing: Secondary | ICD-10-CM

## 2023-07-23 NOTE — Progress Notes (Signed)
 Orthopaedic Postop Note  Assessment: Monica Bell is a 63 y.o. female s/p CRPP of Right nondisplaced femoral neck fracture  DOS: 03/18/2023  Plan: Monica Bell is doing very well overall.  She has occasional aches in the right hip.  Otherwise, she has no complaints.  She has been doing exercises on her own.  She feels as though she would be ready return to work in a couple weeks.  Note for work has been provided.  I would like to see her in approximately 3 months for repeat evaluation.   Follow-up: Return in about 3 months (around 10/23/2023). XR at next visit: AP pelvis and Right hip  Subjective:  Chief Complaint  Patient presents with   Post-op Follow-up    Right hip 03/18/23/ improved     History of Present Illness: Monica Bell is a 63 y.o. female who presents following the above stated procedure.  Surgery was approximately 4 months ago.  He feels much better overall.  She notes occasional aches and pains.  Nothing specific at this time.  No numbness or tingling.  She is doing exercises on her own.  She is getting close to being able to return to work.   Review of Systems: No fevers or chills No numbness or tingling No Chest Pain No shortness of breath   Objective: There were no vitals taken for this visit.  Physical Exam:  Alert and oriented.  No acute distress.  Right sided antalgic gait without assistive device.  Mild atrophy of the right quadriceps.  She is able to maintain a straight leg raise.  No pain with internal/external rotation of her right hip.  Surgical incisions are healed.  No surrounding erythema or drainage.  Active motion intact in EHL/TA.   IMAGING: I personally ordered and reviewed the following images:  AP pelvis and right hip x-rays were obtained in clinic today.  These are compared to prior x-rays.  Femoral neck fracture remains in stable alignment.  Screws not backing out.  There is no screw cut out.  No evidence of AVN.   There is no callus formation.  No evidence of poor healing.  No bony lesions.  Impression: Healed right femoral neck fracture, without hardware failure Oliver Barre, MD 07/23/2023 10:53 AM

## 2023-07-23 NOTE — Patient Instructions (Signed)
 Note for work - ok to return to work starting 08/09/23, without restrictions

## 2023-07-28 ENCOUNTER — Encounter: Payer: Self-pay | Admitting: Cardiology

## 2023-07-28 ENCOUNTER — Ambulatory Visit: Attending: Cardiology | Admitting: Cardiology

## 2023-07-28 VITALS — BP 124/66 | HR 77 | Resp 16 | Ht 71.0 in | Wt 194.2 lb

## 2023-07-28 DIAGNOSIS — I10 Essential (primary) hypertension: Secondary | ICD-10-CM | POA: Diagnosis not present

## 2023-07-28 DIAGNOSIS — D649 Anemia, unspecified: Secondary | ICD-10-CM

## 2023-07-28 DIAGNOSIS — F17210 Nicotine dependence, cigarettes, uncomplicated: Secondary | ICD-10-CM | POA: Diagnosis not present

## 2023-07-28 DIAGNOSIS — I2510 Atherosclerotic heart disease of native coronary artery without angina pectoris: Secondary | ICD-10-CM | POA: Diagnosis not present

## 2023-07-28 DIAGNOSIS — E1165 Type 2 diabetes mellitus with hyperglycemia: Secondary | ICD-10-CM | POA: Diagnosis not present

## 2023-07-28 DIAGNOSIS — E782 Mixed hyperlipidemia: Secondary | ICD-10-CM | POA: Diagnosis not present

## 2023-07-28 MED ORDER — BUPROPION HCL ER (SR) 100 MG PO TB12: 100.0000 mg | ORAL_TABLET | Freq: Two times a day (BID) | ORAL | 3 refills | Status: AC

## 2023-07-28 NOTE — Progress Notes (Signed)
 Cardiology Office Note:  .   Date:  07/28/2023  ID:  Monica Bell, DOB December 15, 1960, MRN [id number] PCP: [name], NP  [facility] Providers Cardiologist:  Truett Mainland, MD PCP: [name], NP  Chief Complaint  Patient presents with   Coronary artery disease of native artery of native heart wi   Follow-up     Monica Bell is a 63 y.o. female with hypertension, hyperlipidemia, type 2 diabetes mellitus, tobacco dependence, CAD, HFrEF-now recovered EF, GERD   Patient had an accidental fall that led to hip fracture requiring hip surgery for months ago.  She is recovering walking without much difficulty now.  She denies any exertional chest pain.  She has occasional skipped beat or neck pain, but only last for few seconds.  Blood pressure is elevated today.  She has been working with her PCP regarding diabetes management.      Vitals:   07/28/23 1541 07/28/23 1558  BP: (!) 172/84 124/66  Pulse: 77   Resp: 16   SpO2: 98%       Review of Systems  Cardiovascular:  Negative for chest pain, dyspnea on exertion, leg swelling, palpitations and syncope.        Studies Reviewed: Marland Kitchen        Independently interpreted 02/2023: HbA1C 10.4% Hb 9.7 Cr 1.30, eGFR 47, Na 134  Coronary intervention 06/2021: LM: Mod 40% disease with MLA 7-9 mm2 LAD: Minimal diffuse disease Lcx: Prox 40-50% disease        Mid Lcx CTO        Patent OM1 stent with no restenosis RCA: Mid 40% disease          Grade 2 collaterals to occluded Lcx   LVEDP normal   (IVUS) guided successful percutaneous coronary intervention mid Lcx (CTO intervention)     PTCA and stent placement 2.25 X 26 mm Onyx Frontier drug-eluting stent     Proximal optimization with 2.75X15 mm [place] 6 balloon at 18 atm     Kissing balloon post stenting angioplasty Lcx/OM1 w/2.75X15 mm and 3.0X8 mm Urania balloons at 12    0% residual stenosis Excellent stent apposition and expansion and ostial mid  Lcx coverage           Intervention     Physical Exam Vitals and nursing note reviewed.  Constitutional:      General: She is not in acute distress. Neck:     Vascular: No JVD.  Cardiovascular:     Rate and Rhythm: Normal rate and regular rhythm.     Heart sounds: Normal heart sounds. No murmur heard. Pulmonary:     Effort: Pulmonary effort is normal.     Breath sounds: Normal breath sounds. No wheezing or rales.  Musculoskeletal:     Right lower leg: No edema.     Left lower leg: No edema.      VISIT DIAGNOSES:   ICD-10-CM   1. Coronary artery disease involving native coronary artery of native heart without angina pectoris  I25.10     2. Uncontrolled type 2 diabetes mellitus with hyperglycemia (HCC)  E11.65     3. Primary hypertension  I10     4. Mixed hyperlipidemia  E78.2     5. Anemia, unspecified type  D64.9        Monica Bell is a 63 y.o. female with hypertension, hyperlipidemia, type 2 diabetes mellitus, tobacco dependence, CAD, HFrEF-now recovered EF, GERD    Assessment & Plan  CAD: NSTEMI 11/2020. Culprit: thrombotic 95% stenosis Prox Lcx into large OM1 (11/2020) Non-culprit: Mid Lcx chronic occlusion with R-to-L grade 3 collaterals Now s/p CTO PCI to mid Lcx (06/2021) Moderate LM and mid RCA disease Completed DAPT. Continue aspirin 81 mg daily. No recurrent angina at this time.  Low EF on stress, but normal on echocardiogram (07/2022). Continue aspirin, statin, Repatha, Coreg. Check lipid panel today. Continue aggressive diabetes management.   HFrEF: Clinically euvolumic.  LVEF recovered. Entresto discontinued in the past due to AKI.  Continue losartan, coreg.   Hypertension: Controlled.   Mixed hyperlipidemia: As above.  Type 2 DM:  Uncontrolled.  Has regular follow with PCP. Check A1c today.    Anemia: Hemoglobin was low after hip surgery. Check CBC today.    Nicotine dependence: Tobacco cessation counseling: -  Currently smoking 1/2 packs/day   - Patient was informed of the dangers of tobacco abuse including stroke, cancer, and MI, as well as benefits of tobacco cessation. - Patient is willing to quit at this time. - Approximately 5 mins were spent counseling patient cessation techniques. We discussed various methods to help quit smoking, including deciding on a date to quit, joining a support group, pharmacological agents. Patient would like to use Wellbutrin 100 mg bid. - I will reassess her progress at the next follow-up visit   Meds ordered this encounter  Medications   buPROPion ER (WELLBUTRIN SR) 100 MG 12 hr tablet    Sig: Take 1 tablet (100 mg total) by mouth 2 (two) times daily.    Dispense:  180 tablet    Refill:  3     F/u in 6 months  Signed, Elder Negus, MD

## 2023-07-28 NOTE — Patient Instructions (Signed)
 Medication Instructions:   START TAKING WELLBUTRIN 100 MG BY MOUTH TWICE DAILY  *If you need a refill on your cardiac medications before your next appointment, please call your pharmacy*  Lab Work:  TOMORROW OR ON FRIDAY AT [facility] FIRST FLOOR--LIPIDS, A1C, AND CBC W DIFF  If you have labs (blood work) drawn today and your tests are completely normal, you will receive your results only by: MyChart Message (if you have MyChart) OR A paper copy in the mail If you have any lab test that is abnormal or we need to change your treatment, we will call you to review the results.    Follow-Up: At [facility], you and your health needs are our priority.  As part of our continuing mission to provide you with exceptional heart care, our providers are all part of one team.  This team includes your primary Cardiologist (physician) and Advanced Practice Providers or APPs (Physician Assistants and Nurse Practitioners) who all work together to provide you with the care you need, when you need it.  Your next appointment:   6 month(s)  Provider:   DR. Rosemary Holms       1st Floor: - Lobby - Registration  - Pharmacy  - Lab - Cafe  2nd Floor: - PV Lab - Diagnostic Testing (echo, CT, nuclear med)  3rd Floor: - Vacant  4th Floor: - TCTS (cardiothoracic surgery) - AFib Clinic - Structural Heart Clinic - Vascular Surgery  - Vascular Ultrasound  5th Floor: - [facility]Care Cardiology (general and EP) - Clinical Pharmacy for coumadin, hypertension, lipid, weight-loss medications, and med management appointments    Valet parking services will be available as well.

## 2023-07-29 ENCOUNTER — Encounter (HOSPITAL_COMMUNITY)

## 2023-08-03 ENCOUNTER — Encounter (HOSPITAL_COMMUNITY)

## 2023-09-17 ENCOUNTER — Other Ambulatory Visit: Payer: Self-pay | Admitting: Cardiology

## 2023-10-26 ENCOUNTER — Ambulatory Visit: Admitting: Orthopedic Surgery

## 2023-11-08 ENCOUNTER — Other Ambulatory Visit (HOSPITAL_COMMUNITY): Payer: Self-pay | Admitting: Nurse Practitioner

## 2023-11-08 DIAGNOSIS — Z1231 Encounter for screening mammogram for malignant neoplasm of breast: Secondary | ICD-10-CM

## 2023-11-15 ENCOUNTER — Ambulatory Visit (HOSPITAL_COMMUNITY)
Admission: RE | Admit: 2023-11-15 | Discharge: 2023-11-15 | Disposition: A | Discharge status: Home / Self Care | Source: Ambulatory Visit | Attending: Nurse Practitioner | Admitting: Nurse Practitioner

## 2023-11-15 ENCOUNTER — Encounter (HOSPITAL_COMMUNITY): Payer: Self-pay

## 2023-11-15 DIAGNOSIS — Z1231 Encounter for screening mammogram for malignant neoplasm of breast: Secondary | ICD-10-CM | POA: Insufficient documentation

## 2023-11-30 ENCOUNTER — Ambulatory Visit: Admitting: Orthopedic Surgery

## 2023-11-30 DIAGNOSIS — N1832 Chronic kidney disease, stage 3b: Secondary | ICD-10-CM | POA: Insufficient documentation

## 2023-11-30 DIAGNOSIS — E663 Overweight: Secondary | ICD-10-CM | POA: Insufficient documentation

## 2023-11-30 DIAGNOSIS — E119 Type 2 diabetes mellitus without complications: Secondary | ICD-10-CM | POA: Insufficient documentation

## 2023-11-30 DIAGNOSIS — I5023 Acute on chronic systolic (congestive) heart failure: Secondary | ICD-10-CM | POA: Insufficient documentation

## 2023-11-30 DIAGNOSIS — E669 Obesity, unspecified: Secondary | ICD-10-CM | POA: Insufficient documentation

## 2023-11-30 DIAGNOSIS — I214 Non-ST elevation (NSTEMI) myocardial infarction: Secondary | ICD-10-CM | POA: Insufficient documentation

## 2023-12-03 ENCOUNTER — Emergency Department (HOSPITAL_COMMUNITY)
Admission: EM | Admit: 2023-12-03 | Discharge: 2023-12-03 | Disposition: A | Discharge status: Home / Self Care | Attending: Emergency Medicine | Admitting: Emergency Medicine

## 2023-12-03 ENCOUNTER — Other Ambulatory Visit: Payer: Self-pay

## 2023-12-03 ENCOUNTER — Emergency Department (HOSPITAL_COMMUNITY)

## 2023-12-03 DIAGNOSIS — Z794 Long term (current) use of insulin: Secondary | ICD-10-CM | POA: Insufficient documentation

## 2023-12-03 DIAGNOSIS — E119 Type 2 diabetes mellitus without complications: Secondary | ICD-10-CM | POA: Diagnosis not present

## 2023-12-03 DIAGNOSIS — Z7984 Long term (current) use of oral hypoglycemic drugs: Secondary | ICD-10-CM | POA: Diagnosis not present

## 2023-12-03 DIAGNOSIS — H00014 Hordeolum externum left upper eyelid: Secondary | ICD-10-CM | POA: Insufficient documentation

## 2023-12-03 DIAGNOSIS — Z79899 Other long term (current) drug therapy: Secondary | ICD-10-CM | POA: Diagnosis not present

## 2023-12-03 DIAGNOSIS — E039 Hypothyroidism, unspecified: Secondary | ICD-10-CM | POA: Insufficient documentation

## 2023-12-03 DIAGNOSIS — Z7982 Long term (current) use of aspirin: Secondary | ICD-10-CM | POA: Diagnosis not present

## 2023-12-03 DIAGNOSIS — L03213 Periorbital cellulitis: Secondary | ICD-10-CM | POA: Diagnosis not present

## 2023-12-03 DIAGNOSIS — I10 Essential (primary) hypertension: Secondary | ICD-10-CM | POA: Diagnosis not present

## 2023-12-03 LAB — CBC WITH DIFFERENTIAL/PLATELET
Abs Immature Granulocytes: 0.03 K/uL (ref 0.00–0.07)
Basophils Absolute: 0.1 K/uL (ref 0.0–0.1)
Basophils Relative: 1 %
Eosinophils Absolute: 0.5 K/uL (ref 0.0–0.5)
Eosinophils Relative: 4 %
HCT: 38.8 % (ref 36.0–46.0)
Hemoglobin: 12.3 g/dL (ref 12.0–15.0)
Immature Granulocytes: 0 %
Lymphocytes Relative: 33 %
Lymphs Abs: 4.1 K/uL — ABNORMAL HIGH (ref 0.7–4.0)
MCH: 25.5 pg — ABNORMAL LOW (ref 26.0–34.0)
MCHC: 31.7 g/dL (ref 30.0–36.0)
MCV: 80.3 fL (ref 80.0–100.0)
Monocytes Absolute: 1 K/uL (ref 0.1–1.0)
Monocytes Relative: 8 %
Neutro Abs: 6.7 K/uL (ref 1.7–7.7)
Neutrophils Relative %: 54 %
Platelets: 394 K/uL (ref 150–400)
RBC: 4.83 MIL/uL (ref 3.87–5.11)
RDW: 18 % — ABNORMAL HIGH (ref 11.5–15.5)
WBC: 12.5 K/uL — ABNORMAL HIGH (ref 4.0–10.5)
nRBC: 0 % (ref 0.0–0.2)

## 2023-12-03 LAB — BASIC METABOLIC PANEL WITH GFR
Anion gap: 10 (ref 5–15)
BUN: 9 mg/dL (ref 8–23)
CO2: 25 mmol/L (ref 22–32)
Calcium: 8.9 mg/dL (ref 8.9–10.3)
Chloride: 104 mmol/L (ref 98–111)
Creatinine, Ser: 1.11 mg/dL — ABNORMAL HIGH (ref 0.44–1.00)
GFR, Estimated: 56 mL/min — ABNORMAL LOW (ref >60)
Glucose, Bld: 197 mg/dL — ABNORMAL HIGH (ref 70–99)
Potassium: 3.8 mmol/L (ref 3.5–5.1)
Sodium: 139 mmol/L (ref 135–145)

## 2023-12-03 MED ORDER — AMOXICILLIN-POT CLAVULANATE 875-125 MG PO TABS: 1.0000 | ORAL_TABLET | Freq: Two times a day (BID) | ORAL | 0 refills | Status: AC

## 2023-12-03 MED ORDER — ACETAMINOPHEN 500 MG PO TABS
1000.0000 mg | ORAL_TABLET | Freq: Once | ORAL | Status: DC
  Filled 2023-12-03: qty 2

## 2023-12-03 MED ORDER — KETOROLAC TROMETHAMINE 15 MG/ML IJ SOLN
15.0000 mg | Freq: Once | INTRAMUSCULAR | Status: AC
  Administered 2023-12-03: 15 mg via INTRAVENOUS
  Filled 2023-12-03: qty 1

## 2023-12-03 MED ORDER — IOHEXOL 300 MG/ML  SOLN
75.0000 mL | Freq: Once | INTRAMUSCULAR | Status: AC | PRN
  Administered 2023-12-03: 75 mL via INTRAVENOUS

## 2023-12-03 MED ORDER — AMOXICILLIN-POT CLAVULANATE 875-125 MG PO TABS
1.0000 | ORAL_TABLET | Freq: Once | ORAL | Status: AC
  Administered 2023-12-03: 1 via ORAL
  Filled 2023-12-03: qty 1

## 2023-12-03 NOTE — ED Provider Notes (Signed)
 [place] EMERGENCY DEPARTMENT AT [facility] Provider Note   CSN: [id number] Arrival date & time: 12/03/23  1650     History  Chief Complaint  Patient presents with   Eye Pain   Headache    Monica Bell is a 63 y.o. female with PMH as listed below who presents with stye/facial pain.  Pt has sty above left eye x 3 weeks that is not going away. Was told to do warm compresses but they aren't helping. She states now over the last couple of days the swelling in her face has gotten worse and she has a bump/area of swelling on her left cheek in front of her left ear that is painful. No painful swallowing, sore throat, oral swelling, submandibular swelling/pain. Thinks she may have had a fever yesterday. Tried to get an appt with ophthalmologist in [place] but wait time is long. Has h/o stye on left upper eyelid that she had to have lanced w/ ophtho.    Past Medical History:  Diagnosis Date   Abnormal mammogram    Allergic rhinitis due to pollen 08/01/2019   Arthritis 01/06/2019   osteoarthritis of both knees   BMI 28.0-28.9,adult 08/01/2019   Cystocele, unspecified (CODE)    Diabetes mellitus    GERD (gastroesophageal reflux disease)    Heart murmur    Hyperlipemia, mixed 12/14/2018   Hypertension    Hypothyroidism    Stress incontinence, female    Thyroid disease    Type 2 diabetes mellitus, without long-term current use of insulin  (HCC) 01/06/2019   Vitamin D deficiency 01/06/2019       Home Medications Prior to Admission medications   Medication Sig Start Date End Date Taking? Authorizing Provider  acetaminophen  (TYLENOL ) 325 MG tablet Take 2 tablets (650 mg total) by mouth every 6 (six) hours as needed for mild pain (pain score 1-3) or moderate pain (pain score 4-6) (or Fever >/= 100.4). 03/19/23   Johnson, Clanford L, MD  aspirin  EC (GNP ASPIRIN  LOW DOSE) 81 MG tablet Take 1 tablet (81 mg total) by mouth 2 (two) times daily. Swallow whole. 03/19/23    Johnson, Clanford L, MD  buPROPion  ER (WELLBUTRIN  SR) 100 MG 12 hr tablet Take 1 tablet (100 mg total) by mouth 2 (two) times daily. 07/28/23   Patwardhan, Newman PARAS, MD  Calcium  Carbonate-Vitamin D (CALTRATE 600+D PO) Take 1 tablet by mouth daily.    [provider]  carvedilol  (COREG ) 6.25 MG tablet TAKE ONE TABLET BY MOUTH TWICE DAILY WITH A MEAL 09/17/23   Patwardhan, Manish J, MD  cetirizine (ZYRTEC) 10 MG tablet Take 10 mg by mouth daily.    [provider]  Evolocumab  (REPATHA  SURECLICK) 140 MG/ML SOAJ Inject 140 mg into the skin every 14 (fourteen) days. 11/16/22   Patwardhan, Newman PARAS, MD  fluconazole  (DIFLUCAN ) 200 MG tablet Take 200 mg by mouth daily. 03/03/23   [provider]  glipiZIDE (GLUCOTROL) 10 MG tablet Take 10 mg by mouth daily before breakfast. 02/24/22   [provider]  guaiFENesin  (MUCINEX ) 600 MG 12 hr tablet Take 2 tablets (1,200 mg total) by mouth 2 (two) times daily. 03/07/23   Suellen Cantor A, PA-C  insulin  aspart (NOVOLOG ) 100 UNIT/ML injection Inject 15 Units into the skin 3 (three) times daily with meals.    [provider]  levothyroxine  (SYNTHROID ) 125 MCG tablet Take 125 mcg by mouth every morning. 05/14/22   [provider]  losartan  (COZAAR ) 25 MG tablet Take  1 tablet (25 mg total) by mouth daily as needed (high bp). 03/19/23 07/17/23  Johnson, Clanford L, MD  magnesium  oxide (MAG-OX) 400 MG tablet Take 400 mg by mouth daily.    [provider]  nitroGLYCERIN  (NITROSTAT ) 0.4 MG SL tablet Place 1 tablet (0.4 mg total) under the tongue every 5 (five) minutes as needed for chest pain. 07/23/22 03/16/23  Patwardhan, Newman PARAS, MD  omeprazole  (PRILOSEC) 20 MG capsule Take 1 capsule (20 mg total) by mouth daily. 03/19/23   Johnson, Clanford L, MD  polyethylene glycol (MIRALAX  / GLYCOLAX ) 17 g packet Take 17 g by mouth daily. 03/20/23   Johnson, Clanford L, MD  TRESIBA FLEXTOUCH 200 UNIT/ML FlexTouch Pen Inject 55  Units into the skin daily. 10/18/20   [provider]  vitamin B-12 (CYANOCOBALAMIN ) 1000 MCG tablet Take 1,000 mcg by mouth daily.    [provider]      Allergies    Codeine and Rosuvastatin     Review of Systems   Review of Systems A 10 point review of systems was performed and is negative unless otherwise reported in HPI.  Physical Exam Updated Vital Signs BP (!) 186/80 (BP Location: Right Arm)   Pulse 80   Temp 98.8 F (37.1 C) (Oral)   Resp 18   Ht 5' 11 (1.803 m)   Wt 87.1 kg   SpO2 96%   BMI 26.78 kg/m  Physical Exam General: Normal appearing female, lying in bed.  HEENT: PERRLA, EOMI, quiet conjunctivae, Sclera anicteric, MMM, trachea midline. NCAT. Mild periorbital edema and erythema with hordeolum noted to left upper eyelid. TTP to left zygoma and left parotid gland with swelling noted to this area as well. No signfiicant induration/fluctuance. Cardiology: RRR, no murmurs/rubs/gallops.  Resp: Normal respiratory rate and effort. CTAB, no wheezes, rhonchi, crackles.  Abd: Soft, non-tender, non-distended. No rebound tenderness or guarding.  GU: Deferred. MSK: No peripheral edema or signs of trauma. Skin: warm, dry.  Neuro: A&Ox4, CNs II-XII grossly intact. MAEs. Sensation grossly intact.  Psych: Normal mood and affect.   ED Results / Procedures / Treatments   Labs (all labs ordered are listed, but only abnormal results are displayed) Labs Reviewed  CBC WITH DIFFERENTIAL/PLATELET - Abnormal; Notable for the following components:      Result Value   WBC 12.5 (*)    MCH 25.5 (*)    RDW 18.0 (*)    Lymphs Abs 4.1 (*)    All other components within normal limits  BASIC METABOLIC PANEL WITH GFR - Abnormal; Notable for the following components:   Glucose, Bld 197 (*)    Creatinine, Ser 1.11 (*)    GFR, Estimated 56 (*)    All other components within normal limits    EKG None  Radiology CT max face w contrast: Asymmetric soft tissue swelling  involving the left preseptal periorbital soft tissues, nonspecific, but could reflect changes of acute preseptal periorbital cellulitis. No postseptal or intraorbital extension at this time. No discrete abscess or drainable fluid collection.  Procedures Procedures    Medications Ordered in ED Medications  iohexol  (OMNIPAQUE ) 300 MG/ML solution 75 mL (75 mLs Intravenous Contrast Given 12/03/23 1911)  amoxicillin -clavulanate (AUGMENTIN ) 875-125 MG per tablet 1 tablet (1 tablet Oral Given 12/03/23 2056)  ketorolac  (TORADOL ) 15 MG/ML injection 15 mg (15 mg Intravenous Given 12/03/23 2059)    ED Course/ Medical Decision Making/ A&P  Medical Decision Making Amount and/or Complexity of Data Reviewed Labs: ordered. Decision-making details documented in ED Course. Radiology: ordered. Decision-making details documented in ED Course.  Risk Prescription drug management.    This patient presents to the ED for concern of stye, facial swelling/pain, this involves an extensive number of treatment options, and is a complaint that carries with it a high risk of complications and morbidity.  I considered the following differential and admission for this acute, potentially life threatening condition. Non-toxic, well-appearing.  MDM:    Patient likely w/ stye. No pain with EOM, low c/f orbital cellulitis. Does have some swelling/erythema but could just be related to stye though must consider periorbital cellulitis as well. She now has swelling/pain in area of left parotid gland, consider parotitis or suppurative parotitis. Given c/f infection in area of stye must also consider facial cellulitis or facial abscess. She is overall well-appearing, HDS, afebrile, low c/f sepsis or serious life threatening bacterial infection. She does report fever yesterday but is afebrile today. Performed shared decision making with patient and will move forward with basic labs and ct max face.    Clinical Course as of 12/10/23 2347  Fri Dec 03, 2023  1928 WBC(!): 12.5 +leukocytosis w/ no left shift, increased lymphocytes [HN]  2000 CT MAXILLOFACIAL W CONTRAST Asymmetric soft tissue swelling involving the left preseptal periorbital soft tissues, nonspecific, but could reflect changes of acute preseptal periorbital cellulitis. No postseptal or intraorbital extension at this time. No discrete abscess or drainable fluid collection.   [HN]    Clinical Course User Index [HN] Franklyn Sid SAILOR, MD    Patient with likely preseptal cellulitis.  She also has an already a wound that is visible.  Will prescribe Augmentin  twice daily x 7 days advise follow-up with an ophthalmologist within 1 week as well as with her PCP within 1 week.  Recommended continuing with warm compresses.  She has a leukocytosis but no fever or other signs of sepsis, and she is nontoxic-appearing.  Given specific discharge instructions and return precautions, all questions answered to patient satisfaction.   Labs: I Ordered, and personally interpreted labs.  The pertinent results include:  those listed above  Imaging Studies ordered: I ordered imaging studies including ct max face w/ contrast I independently visualized and interpreted imaging. I agree with the radiologist interpretation  Additional history obtained from chart review  Reevaluation: After the interventions noted above, I reevaluated the patient and found that they have :improved  Social Determinants of Health: Lives independently  Disposition:  DC  Co morbidities that complicate the patient evaluation  Past Medical History:  Diagnosis Date   Abnormal mammogram    Allergic rhinitis due to pollen 08/01/2019   Arthritis 01/06/2019   osteoarthritis of both knees   BMI 28.0-28.9,adult 08/01/2019   Cystocele, unspecified (CODE)    Diabetes mellitus    GERD (gastroesophageal reflux disease)    Heart murmur    Hyperlipemia, mixed  12/14/2018   Hypertension    Hypothyroidism    Stress incontinence, female    Thyroid disease    Type 2 diabetes mellitus, without long-term current use of insulin  (HCC) 01/06/2019   Vitamin D deficiency 01/06/2019     Medicines No orders of the defined types were placed in this encounter.   I have reviewed the patients home medicines and have made adjustments as needed  Problem List / ED Course: Problem List Items Addressed This Visit   None Visit Diagnoses  Hordeolum externum of left upper eyelid    -  Primary     Periorbital cellulitis of left eye                       This note was created using dictation software, which may contain spelling or grammatical errors.    Franklyn Sid SAILOR, MD 12/10/23 2350

## 2023-12-03 NOTE — Discharge Instructions (Addendum)
 Thank you for coming to [facility] Emergency Department. You were seen for left eye and facial swelling. We did an exam, labs, and imaging, and these showed a stye and likely an infection in the skin surrounding the eye, called periorbital cellulitis. For this please take augmentin  one tablet twice per day for 7 days. Please alternate tylenol /ibuprofen  for pain and discomfort at home. Please follow-up with your primary care physician within 1 week as well as an ophthalmologist within 1 week.  Do not hesitate to return to the ED or call 911 if you experience: -Worsening symptoms -Blurry vision -Pain with movement of the eye -Lightheadedness, passing out -Fevers/chills -Anything else that concerns you

## 2023-12-03 NOTE — ED Notes (Signed)
 Pt/family received d/c paperwork at this time. After going over the paperwork any questions, comments, or concerns were answered to the best of this nurse's knowledge. The pt/family verbally acknowledged the teachings/instructions.

## 2023-12-03 NOTE — ED Triage Notes (Signed)
 Pt has sty above left eye and is also complaining of facial and head pain. Pt stated that it has been a problem for 3 weeks and no interventions that have been done have helped.

## 2023-12-10 ENCOUNTER — Ambulatory Visit: Admitting: Orthopedic Surgery

## 2024-01-28 ENCOUNTER — Ambulatory Visit: Admitting: Cardiology

## 2024-03-22 ENCOUNTER — Encounter: Payer: Self-pay | Admitting: Cardiology

## 2024-03-22 ENCOUNTER — Ambulatory Visit: Attending: Internal Medicine | Admitting: Cardiology

## 2024-03-22 VITALS — BP 172/68 | HR 75 | Ht 71.0 in | Wt 194.0 lb

## 2024-03-22 DIAGNOSIS — Z789 Other specified health status: Secondary | ICD-10-CM | POA: Diagnosis not present

## 2024-03-22 DIAGNOSIS — F17210 Nicotine dependence, cigarettes, uncomplicated: Secondary | ICD-10-CM | POA: Diagnosis not present

## 2024-03-22 DIAGNOSIS — I10 Essential (primary) hypertension: Secondary | ICD-10-CM

## 2024-03-22 DIAGNOSIS — I2510 Atherosclerotic heart disease of native coronary artery without angina pectoris: Secondary | ICD-10-CM | POA: Diagnosis not present

## 2024-03-22 DIAGNOSIS — E782 Mixed hyperlipidemia: Secondary | ICD-10-CM

## 2024-03-22 DIAGNOSIS — E1165 Type 2 diabetes mellitus with hyperglycemia: Secondary | ICD-10-CM

## 2024-03-22 MED ORDER — LOSARTAN POTASSIUM 50 MG PO TABS: 50.0000 mg | ORAL_TABLET | Freq: Every day | ORAL | 3 refills | Status: AC

## 2024-03-22 MED ORDER — NICOTINE POLACRILEX 2 MG MT GUM: 2.0000 mg | CHEWING_GUM | OROMUCOSAL | 0 refills | Status: AC | PRN

## 2024-03-22 NOTE — Patient Instructions (Signed)
 Medication Instructions:  START use of Nicotine  gum  START Losartan  50 mg daily   *If you need a refill on your cardiac medications before your next appointment, please call your pharmacy*  Lab Work: BMP in 1 week  If you have labs (blood work) drawn today and your tests are completely normal, you will receive your results only by: MyChart Message (if you have MyChart) OR A paper copy in the mail If you have any lab test that is abnormal or we need to change your treatment, we will call you to review the results.  Testing/Procedures: ABI  Your physician has requested that you have an ankle brachial index (ABI). During this test an ultrasound and blood pressure cuff are used to evaluate the arteries that supply the arms and legs with blood. Allow thirty minutes for this exam. There are no restrictions or special instructions.  REFERRAL TO PHARM-D LIPID CLINIC     Please note: We ask at that you not bring children with you during ultrasound (echo/ vascular) testing. Due to room size and safety concerns, children are not allowed in the ultrasound rooms during exams. Our front office staff cannot provide observation of children in our lobby area while testing is being conducted. An adult accompanying a patient to their appointment will only be allowed in the ultrasound room at the discretion of the ultrasound technician under special circumstances. We apologize for any inconvenience.   Follow-Up: At [facility], you and your health needs are our priority.  As part of our continuing mission to provide you with exceptional heart care, our providers are all part of one team.  This team includes your primary Cardiologist (physician) and Advanced Practice Providers or APPs (Physician Assistants and Nurse Practitioners) who all work together to provide you with the care you need, when you need it.  Your next appointment:   3 month(s)  Provider:   Newman JINNY Lawrence, MD

## 2024-03-22 NOTE — Progress Notes (Signed)
 Cardiology Office Note:  .   Date:  03/22/2024  ID:  Monica Bell, DOB 10-Dec-1960, MRN [id number] PCP: Monica Bell FALCON, NP  [facility] Providers Cardiologist:  Monica Lawrence, MD PCP: Monica Bell FALCON, NP  Chief Complaint  Patient presents with   Coronary Artery Disease          Monica Bell is a 63 y.o. female with hypertension, hyperlipidemia, type 2 diabetes mellitus, tobacco dependence, CAD, HFrEF-now recovered EF, GERD   Patient denies any complains of chest pain, shortness of breath, leg edema.  She remains noncompliant with her antihypertensive very, is not taking losartan .  A1c remains elevated at 10%.  She does not tolerate statins in the past due to myalgias.  Contrary to my previous understanding, she is not on Repatha  at this time.   Vitals:   03/22/24 1501 03/22/24 1523  BP: (!) 178/72 (!) 172/68  Pulse: 75   SpO2: 98%        Review of Systems  Cardiovascular:  Negative for chest pain, dyspnea on exertion, leg swelling, palpitations and syncope.        Studies Reviewed: SABRA        EKG 03/22/2024: Normal sinus rhythm Nonspecific T wave abnormality When compared with ECG of 16-Mar-2023 14:18, No significant change was found  Independently interpreted 02/2023: HbA1C 10.4% Hb 9.7 Cr 1.30, eGFR 47, Na 134  Coronary intervention 06/2021: LM: Mod 40% disease with MLA 7-9 mm2 LAD: Minimal diffuse disease Lcx: Prox 40-50% disease        Mid Lcx CTO        Patent OM1 stent with no restenosis RCA: Mid 40% disease          Grade 2 collaterals to occluded Lcx   LVEDP normal   (IVUS) guided successful percutaneous coronary intervention mid Lcx (CTO intervention)     PTCA and stent placement 2.25 X 26 mm Onyx Frontier drug-eluting stent     Proximal optimization with 2.75X15 mm [place] balloon at 18 atm     Kiss[place] balloon post stenting angioplasty Lcx/OM1 w/2.75X15 mm and 3.0X8 mm [place] balloons at 12    0% residual  stenosis Excellent stent apposition and expansion and ostial mid Lcx coverage           Intervention     Labs 11/2023: Hb 12.3 Cr 1.11, eGFR 56  05/2023: Chol 202, TG 82, HDL 57, LDL 127 HbA1C 10% Hb 11.1 TSH 10.6     Physical Exam Vitals and nursing note reviewed.  Constitutional:      General: She is not in acute distress. Neck:     Vascular: No JVD.  Cardiovascular:     Rate and Rhythm: Normal rate and regular rhythm.     Heart sounds: Normal heart sounds. No murmur heard. Pulmonary:     Effort: Pulmonary effort is normal.     Breath sounds: Normal breath sounds. No wheezing or rales.  Musculoskeletal:     Right lower leg: No edema.     Left lower leg: No edema.      VISIT DIAGNOSES:   ICD-10-CM   1. Coronary artery disease involving native coronary artery of native heart without angina pectoris  I25.10 EKG 12-Lead    2. Uncontrolled type 2 diabetes mellitus with hyperglycemia (HCC)  E11.65 EKG 12-Lead    VAS US  ABI WITH/WO TBI    3. Mixed hyperlipidemia  E78.2 EKG 12-Lead    AMB Referral to [facility]  4. Statin intolerance  Z78.9 AMB Referral to [facility]    5. Primary hypertension  I10 Basic metabolic panel with GFR    Basic metabolic panel with GFR        Monica Bell is a 63 y.o. female with hypertension, hyperlipidemia, type 2 diabetes mellitus, tobacco dependence, CAD, HFrEF-now recovered EF, GERD    Assessment & Plan   CAD: NSTEMI 11/2020. Culprit: thrombotic 95% stenosis Prox Lcx into large OM1 (11/2020) Non-culprit: Mid Lcx chronic occlusion with R-to-L grade 3 collaterals Now s/p CTO PCI to mid Lcx (06/2021) Moderate LM and mid RCA disease Completed DAPT. Continue aspirin  81 mg daily. No recurrent angina at this time.  Low EF on stress, but normal on echocardiogram (07/2022). Continue aspirin . Not on statin due to statin intolerance.  LDL 127. Referred to lipid clinic for consideration for injectable  agents.  Hypertension: Uncontrolled.  Compliance is an issue. Restart losartan  at 50 mg daily, especially given her underlying diabetes. Check BMP in 1 week.     Mixed hyperlipidemia: As above.  Type 2 DM:  Diabetes remain very poorly controlled.  A1c was 10% recently.  I have encouraged her to talk to her PCP regarding endocrinology referral.   She remains at very high risk for recurrent CAD related events due to her poorly controlled diabetes and hyperlipidemia.  Nicotine  dependence: Tobacco cessation counseling: - Currently smoking 1/4 packs/day   - Patient was informed of the dangers of tobacco abuse including stroke, cancer, and MI, as well as benefits of tobacco cessation. - Patient is willing to quit at this time. - Approximately 5 mins were spent counseling patient cessation techniques. We discussed various methods to help quit smoking, including deciding on a date to quit, joining a support group, pharmacological agents. Patient would like to use nicotine  gum. - I will reassess her progress at the next follow-up visit      Meds ordered this encounter  Medications   losartan  (COZAAR ) 50 MG tablet    Sig: Take 1 tablet (50 mg total) by mouth daily.    Dispense:  90 tablet    Refill:  3   nicotine  polacrilex (NICORETTE ) 2 MG gum    Sig: Take 1 each (2 mg total) by mouth as needed for smoking cessation.    Dispense:  100 tablet    Refill:  0     F/u in 6 months  Signed, Monica Monica Lawrence, MD

## 2024-03-28 ENCOUNTER — Ambulatory Visit (HOSPITAL_COMMUNITY): Admission: RE | Admit: 2024-03-28 | Source: Ambulatory Visit

## 2024-03-29 ENCOUNTER — Encounter (HOSPITAL_COMMUNITY): Payer: Self-pay

## 2024-04-14 ENCOUNTER — Ambulatory Visit: Admitting: Anesthesiology

## 2024-04-14 ENCOUNTER — Encounter: Admission: RE | Disposition: A | Payer: Self-pay | Attending: Gastroenterology

## 2024-04-14 ENCOUNTER — Ambulatory Visit
Admission: RE | Admit: 2024-04-14 | Discharge: 2024-04-14 | Disposition: A | Discharge status: Home / Self Care | Attending: Gastroenterology | Admitting: Gastroenterology

## 2024-04-14 ENCOUNTER — Other Ambulatory Visit: Payer: Self-pay

## 2024-04-14 DIAGNOSIS — Z794 Long term (current) use of insulin: Secondary | ICD-10-CM | POA: Diagnosis not present

## 2024-04-14 DIAGNOSIS — N189 Chronic kidney disease, unspecified: Secondary | ICD-10-CM | POA: Diagnosis not present

## 2024-04-14 DIAGNOSIS — A630 Anogenital (venereal) warts: Secondary | ICD-10-CM | POA: Diagnosis not present

## 2024-04-14 DIAGNOSIS — F17200 Nicotine dependence, unspecified, uncomplicated: Secondary | ICD-10-CM | POA: Diagnosis not present

## 2024-04-14 DIAGNOSIS — I2510 Atherosclerotic heart disease of native coronary artery without angina pectoris: Secondary | ICD-10-CM | POA: Insufficient documentation

## 2024-04-14 DIAGNOSIS — K640 First degree hemorrhoids: Secondary | ICD-10-CM | POA: Insufficient documentation

## 2024-04-14 DIAGNOSIS — K5730 Diverticulosis of large intestine without perforation or abscess without bleeding: Secondary | ICD-10-CM | POA: Diagnosis not present

## 2024-04-14 DIAGNOSIS — Z1211 Encounter for screening for malignant neoplasm of colon: Secondary | ICD-10-CM | POA: Diagnosis present

## 2024-04-14 DIAGNOSIS — E1122 Type 2 diabetes mellitus with diabetic chronic kidney disease: Secondary | ICD-10-CM | POA: Insufficient documentation

## 2024-04-14 DIAGNOSIS — Z7984 Long term (current) use of oral hypoglycemic drugs: Secondary | ICD-10-CM | POA: Insufficient documentation

## 2024-04-14 DIAGNOSIS — I252 Old myocardial infarction: Secondary | ICD-10-CM | POA: Diagnosis not present

## 2024-04-14 DIAGNOSIS — D129 Benign neoplasm of anus and anal canal: Secondary | ICD-10-CM | POA: Insufficient documentation

## 2024-04-14 DIAGNOSIS — K219 Gastro-esophageal reflux disease without esophagitis: Secondary | ICD-10-CM | POA: Diagnosis not present

## 2024-04-14 DIAGNOSIS — E039 Hypothyroidism, unspecified: Secondary | ICD-10-CM | POA: Diagnosis not present

## 2024-04-14 DIAGNOSIS — I129 Hypertensive chronic kidney disease with stage 1 through stage 4 chronic kidney disease, or unspecified chronic kidney disease: Secondary | ICD-10-CM | POA: Diagnosis not present

## 2024-04-14 HISTORY — PX: BIOPSY OF SKIN SUBCUTANEOUS TISSUE AND/OR MUCOUS MEMBRANE: SHX6741

## 2024-04-14 HISTORY — PX: COLONOSCOPY: SHX5424

## 2024-04-14 LAB — GLUCOSE, CAPILLARY: Glucose-Capillary: 151 mg/dL — ABNORMAL HIGH (ref 70–99)

## 2024-04-14 SURGERY — COLONOSCOPY
Anesthesia: General

## 2024-04-14 MED ORDER — LIDOCAINE HCL (PF) 2 % IJ SOLN
INTRAMUSCULAR | Status: AC
  Filled 2024-04-14: qty 5

## 2024-04-14 MED ORDER — PROPOFOL 10 MG/ML IV BOLUS
INTRAVENOUS | Status: DC | PRN
  Administered 2024-04-14: 50 mg via INTRAVENOUS
  Administered 2024-04-14: 30 mg via INTRAVENOUS
  Administered 2024-04-14: 40 mg via INTRAVENOUS
  Administered 2024-04-14: 30 mg via INTRAVENOUS
  Administered 2024-04-14: 40 mg via INTRAVENOUS
  Administered 2024-04-14 (×2): 30 mg via INTRAVENOUS
  Administered 2024-04-14: 50 mg via INTRAVENOUS

## 2024-04-14 MED ORDER — SODIUM CHLORIDE 0.9 % IV SOLN: INTRAVENOUS | Status: DC

## 2024-04-14 MED ORDER — LIDOCAINE HCL (CARDIAC) PF 100 MG/5ML IV SOSY
PREFILLED_SYRINGE | INTRAVENOUS | Status: DC | PRN
  Administered 2024-04-14: 60 mg via INTRAVENOUS

## 2024-04-14 MED ORDER — PROPOFOL 10 MG/ML IV BOLUS
INTRAVENOUS | Status: AC
  Filled 2024-04-14: qty 40

## 2024-04-14 MED ORDER — GLYCOPYRROLATE 0.2 MG/ML IJ SOLN
INTRAMUSCULAR | Status: DC | PRN
  Administered 2024-04-14: .1 mg via INTRAVENOUS

## 2024-04-14 NOTE — Transfer of Care (Signed)
 Immediate Anesthesia Transfer of Care Note  Patient: Monica Bell  Procedure(s) Performed: COLONOSCOPY  Patient Location: PACU  Anesthesia Type:MAC  Level of Consciousness: awake, alert , and oriented  Airway & Oxygen Therapy: Patient Spontanous Breathing  Post-op Assessment: Report given to RN and Post -op Vital signs reviewed and stable  Post vital signs: Reviewed and stable  Last Vitals:  Vitals Value Taken Time  BP 127/67 04/14/24 13:44  Temp 35.8 C 04/14/24 13:44  Pulse 80 04/14/24 13:45  Resp 21 04/14/24 13:45  SpO2 100 % 04/14/24 13:45  Vitals shown include unfiled device data.  Last Pain:  Vitals:   04/14/24 1344  TempSrc:   PainSc: Asleep         Complications: No notable events documented.

## 2024-04-14 NOTE — Interval H&P Note (Signed)
 History and Physical Interval Note:  04/14/2024 1:12 PM  Monica Bell  has presented today for surgery, with the diagnosis of Hx of adenomatous colonic polyps (Z86.0101).  The various methods of treatment have been discussed with the patient and family. After consideration of risks, benefits and other options for treatment, the patient has consented to  Procedures: COLONOSCOPY (N/A) as a surgical intervention.  The patient's history has been reviewed, patient examined, no change in status, stable for surgery.  I have reviewed the patient's chart and labs.  Questions were answered to the patient's satisfaction.     Ole ONEIDA Schick  Ok to proceed with colonoscopy

## 2024-04-14 NOTE — H&P (Signed)
 Outpatient short stay form Pre-procedure 04/14/2024  Ole ONEIDA Schick, MD  Primary Physician: Johnson Morna FALCON, NP  Reason for visit:  Surveillance  History of present illness:    63 y/o lady with history of hypertension, DM II, hypothyroidism, CKD, and CAD here for surveillance colonoscopy. Last colonoscopy in 2021 with 1 cm TA removed at [facility]. No abdominal surgeries. No blood thinners. No family history of GI malignancies.   Current Medications[1]  Medications Prior to Admission  Medication Sig Dispense Refill Last Dose/Taking   aspirin  EC (GNP ASPIRIN  LOW DOSE) 81 MG tablet Take 1 tablet (81 mg total) by mouth 2 (two) times daily. Swallow whole.   04/13/2024   Calcium  Carbonate-Vitamin D (CALTRATE 600+D PO) Take 1 tablet by mouth daily.   Past Week   carvedilol  (COREG ) 6.25 MG tablet TAKE ONE TABLET BY MOUTH TWICE DAILY WITH A MEAL 180 tablet 3 04/13/2024   cetirizine (ZYRTEC) 10 MG tablet Take 10 mg by mouth daily.   04/13/2024   glipiZIDE (GLUCOTROL) 10 MG tablet Take 10 mg by mouth daily before breakfast.   Past Week   insulin  aspart (NOVOLOG ) 100 UNIT/ML injection Inject 15 Units into the skin 3 (three) times daily with meals.   Past Week   levothyroxine  (SYNTHROID ) 125 MCG tablet Take 125 mcg by mouth every morning.   04/13/2024   losartan  (COZAAR ) 50 MG tablet Take 1 tablet (50 mg total) by mouth daily. 90 tablet 3 04/13/2024   magnesium  oxide (MAG-OX) 400 MG tablet Take 400 mg by mouth daily.   Past Week   omeprazole  (PRILOSEC) 20 MG capsule Take 1 capsule (20 mg total) by mouth daily. 30 capsule 2 04/13/2024   TRESIBA FLEXTOUCH 200 UNIT/ML FlexTouch Pen Inject 55 Units into the skin daily.   Past Week   vitamin B-12 (CYANOCOBALAMIN ) 1000 MCG tablet Take 1,000 mcg by mouth daily.   Past Week   acetaminophen  (TYLENOL ) 325 MG tablet Take 2 tablets (650 mg total) by mouth every 6 (six) hours as needed for mild pain (pain score 1-3) or moderate pain (pain score 4-6) (or Fever  >/= 100.4).      amoxicillin -clavulanate (AUGMENTIN ) 875-125 MG tablet Take 1 tablet by mouth every 12 (twelve) hours. (Patient not taking: Reported on 04/14/2024) 14 tablet 0 Not Taking   buPROPion  ER (WELLBUTRIN  SR) 100 MG 12 hr tablet Take 1 tablet (100 mg total) by mouth 2 (two) times daily. 180 tablet 3    Evolocumab  (REPATHA  SURECLICK) 140 MG/ML SOAJ Inject 140 mg into the skin every 14 (fourteen) days. 6 mL 5    fluconazole  (DIFLUCAN ) 200 MG tablet Take 200 mg by mouth daily.      guaiFENesin  (MUCINEX ) 600 MG 12 hr tablet Take 2 tablets (1,200 mg total) by mouth 2 (two) times daily. 20 tablet 0    nicotine  polacrilex (NICORETTE ) 2 MG gum Take 1 each (2 mg total) by mouth as needed for smoking cessation. 100 tablet 0    nitroGLYCERIN  (NITROSTAT ) 0.4 MG SL tablet Place 1 tablet (0.4 mg total) under the tongue every 5 (five) minutes as needed for chest pain. 30 tablet 3    polyethylene glycol (MIRALAX  / GLYCOLAX ) 17 g packet Take 17 g by mouth daily. 14 each 0      Allergies[2]   Past Medical History:  Diagnosis Date   Abnormal mammogram    Allergic rhinitis due to pollen 08/01/2019   Arthritis 01/06/2019   osteoarthritis of both knees   BMI 28.0-28.9,adult 08/01/2019  Cystocele, unspecified (CODE)    Diabetes mellitus    GERD (gastroesophageal reflux disease)    Heart murmur    Hyperlipemia, mixed 12/14/2018   Hypertension    Hypothyroidism    Stress incontinence, female    Thyroid disease    Type 2 diabetes mellitus, without long-term current use of insulin  (HCC) 01/06/2019   Vitamin D deficiency 01/06/2019    Review of systems:  Otherwise negative.    Physical Exam  Gen: Alert, oriented. Appears stated age.  HEENT: PERRLA. Lungs: No respiratory distress CV: RRR Abd: soft, benign, no masses Ext: No edema    Planned procedures: Proceed with colonoscopy. The patient understands the nature of the planned procedure, indications, risks, alternatives and potential  complications including but not limited to bleeding, infection, perforation, damage to internal organs and possible oversedation/side effects from anesthesia. The patient agrees and gives consent to proceed.  Please refer to procedure notes for findings, recommendations and patient disposition/instructions.     Ole ONEIDA Schick, MD [facility]         [1]  Current Facility-Administered Medications:    0.9 %  sodium chloride  infusion, , Intravenous, Continuous, Guenevere Roorda, Ole ONEIDA, MD, Last Rate: 20 mL/hr at 04/14/24 1250, New Bag at 04/14/24 1250 [2]  Allergies Allergen Reactions   Codeine Nausea Only   Rosuvastatin      Other Reaction(s): muscle cramps

## 2024-04-14 NOTE — Op Note (Signed)
 [facility]astroenterology Patient Name: Monica Bell Procedure Date: 04/14/2024 1:03 PM MRN: [id number] Account #: [id number] Date of Birth: 1960/07/08 Admit Type: Outpatient Age: 63 Room: [facility] ENDO ROOM 3 Gender: Female Note Status: Finalized Instrument Name: Colon Scope [id number] Procedure:             Colonoscopy Indications:           Surveillance: Personal history of adenomatous polyps                         on last colonoscopy > 3 years ago Providers:             Ole Schick MD, MD Referring MD:          No Local Md, MD (Referring MD) Medicines:             Monitored Anesthesia Care Complications:         No immediate complications. Estimated blood loss:                         Minimal. Procedure:             Pre-Anesthesia Assessment:                        - Prior to the procedure, a History and Physical was                         performed, and patient medications and allergies were                         reviewed. The patient is competent. The risks and                         benefits of the procedure and the sedation options and                         risks were discussed with the patient. All questions                         were answered and informed consent was obtained.                         Patient identification and proposed procedure were                         verified by the physician, the nurse, the                         anesthesiologist, the anesthetist and the technician                         in the endoscopy suite. Mental Status Examination:                         alert and oriented. Airway Examination: normal                         oropharyngeal airway and neck mobility. Respiratory  Examination: clear to auscultation. CV Examination:                         normal. Prophylactic Antibiotics: The patient does not                         require prophylactic antibiotics. Prior                          Anticoagulants: The patient has taken no anticoagulant                         or antiplatelet agents. ASA Grade Assessment: II - A                         patient with mild systemic disease. After reviewing                         the risks and benefits, the patient was deemed in                         satisfactory condition to undergo the procedure. The                         anesthesia plan was to use monitored anesthesia care                         (MAC). Immediately prior to administration of                         medications, the patient was re-assessed for adequacy                         to receive sedatives. The heart rate, respiratory                         rate, oxygen saturations, blood pressure, adequacy of                         pulmonary ventilation, and response to care were                         monitored throughout the procedure. The physical                         status of the patient was re-assessed after the                         procedure.                        After obtaining informed consent, the colonoscope was                         passed under direct vision. Throughout the procedure,                         the patient's blood pressure, pulse, and oxygen  saturations were monitored continuously. The                         Colonoscope was introduced through the anus and                         advanced to the the terminal ileum, with                         identification of the appendiceal orifice and IC                         valve. The colonoscopy was performed without                         difficulty. The patient tolerated the procedure well.                         The quality of the bowel preparation was good. The                         terminal ileum, ileocecal valve, appendiceal orifice,                         and rectum were photographed. Findings:      The perianal and digital rectal examinations were  normal.      The terminal ileum appeared normal.      A single small-mouthed diverticulum was found in the sigmoid colon.      Few small condylomata were found. This was biopsied with a cold forceps       for histology. Estimated blood loss was minimal.      Internal hemorrhoids were found during retroflexion. The hemorrhoids       were Grade I (internal hemorrhoids that do not prolapse).      The exam was otherwise without abnormality on direct and retroflexion       views. Impression:            - The examined portion of the ileum was normal.                        - Diverticulosis in the sigmoid colon.                        - Condylomata. Biopsied.                        - Internal hemorrhoids.                        - The examination was otherwise normal on direct and                         retroflexion views. Recommendation:        - Discharge patient to home.                        - Resume previous diet.                        - Continue present medications.                        -  Await pathology results.                        - Repeat colonoscopy for surveillance based on                         pathology results.                        - Return to referring physician as previously                         scheduled. Procedure Code(s):     --- Professional ---                        [phone], Colonoscopy, flexible; with biopsy, single or                         multiple Diagnosis Code(s):     --- Professional ---                        Z86.010, Personal history of colonic polyps                        K64.0, First degree hemorrhoids                        A63.0, Anogenital (venereal) warts                        K57.30, Diverticulosis of large intestine without                         perforation or abscess without bleeding CPT copyright 2022 American Medical Association. All rights reserved. The codes documented in this report are preliminary and upon coder review may  be  revised to meet current compliance requirements. Ole Schick MD, MD 04/14/2024 1:49:46 PM Number of Addenda: 0 Note Initiated On: 04/14/2024 1:03 PM Scope Withdrawal Time: 0 hours 10 minutes 0 seconds  Total Procedure Duration: 0 hours 16 minutes 50 seconds  Estimated Blood Loss:  Estimated blood loss was minimal.      [facility]

## 2024-04-14 NOTE — Anesthesia Preprocedure Evaluation (Signed)
 "                                  Anesthesia Evaluation  Patient identified by MRN, date of birth, ID band Patient awake    Reviewed: Allergy & Precautions, NPO status , Patient's Chart, lab work & pertinent test results  History of Anesthesia Complications (+) PONV and history of anesthetic complications  Airway Mallampati: III  TM Distance: >3 FB Neck ROM: full    Dental  (+) Chipped, Poor Dentition   Pulmonary neg shortness of breath, COPD, Current Smoker and Patient abstained from smoking.   Pulmonary exam normal        Cardiovascular Exercise Tolerance: Good hypertension, (-) angina + CAD and + Past MI  Normal cardiovascular exam     Neuro/Psych  Neuromuscular disease  negative psych ROS   GI/Hepatic Neg liver ROS,GERD  Controlled,,  Endo/Other  diabetes, Type 2Hypothyroidism    Renal/GU Renal disease  negative genitourinary   Musculoskeletal   Abdominal   Peds  Hematology negative hematology ROS (+)   Anesthesia Other Findings Past Medical History: No date: Abnormal mammogram 08/01/2019: Allergic rhinitis due to pollen 01/06/2019: Arthritis     Comment:  osteoarthritis of both knees 08/01/2019: BMI 28.0-28.9,adult No date: Cystocele, unspecified (CODE) No date: Diabetes mellitus No date: GERD (gastroesophageal reflux disease) No date: Heart murmur 12/14/2018: Hyperlipemia, mixed No date: Hypertension No date: Hypothyroidism No date: Stress incontinence, female No date: Thyroid disease 01/06/2019: Type 2 diabetes mellitus, without long-term current use  of insulin  (HCC) 01/06/2019: Vitamin D deficiency  Past Surgical History: 03/18/2020: COLONOSCOPY WITH PROPOFOL ; N/A     Comment:  Procedure: COLONOSCOPY WITH PROPOFOL ;  Surgeon:               Maryruth Ole DASEN, MD;  Location: [facility] ENDOSCOPY;                Service: Endoscopy;  Laterality: N/A; 07/15/2021: CORONARY BALLOON ANGIOPLASTY; N/A     Comment:  Procedure: CORONARY  BALLOON ANGIOPLASTY;  Surgeon:               Elmira Newman PARAS, MD;  Location: [facility] INVASIVE CV LAB;               Service: Cardiovascular;  Laterality: N/A; 12/06/2020: CORONARY STENT INTERVENTION; N/A     Comment:  Procedure: CORONARY STENT INTERVENTION;  Surgeon:               Elmira Newman PARAS, MD;  Location: [facility]AS[facility] CV LAB;               Service: Cardiovascular;  Laterality: N/A; 07/15/2021: CORONARY STENT INTERVENTION; N/A     Comment:  Procedure: CORONARY STENT INTERVENTION;  Surgeon:               Elmira Newman PARAS, MD;  Location: [facility];               Service: Cardiovascular;  Laterality: N/A; 12/06/2020: CORONARY ULTRASOUND/IVUS; N/A     Comment:  Procedure: Intravascular Ultrasound/IVUS;  Surgeon:               Elmira Newman PARAS, MD;  Location: [facility];               Service: Cardiovascular;  Laterality: N/A; 07/15/2021: CORONARY ULTRASOUND/IVUS; N/A     Comment:  Procedure: Intravascular Ultrasound/IVUS;  Surgeon:  Patwardhan, Newman PARAS, MD;  Location: [facility];               Service: Cardiovascular;  Laterality: N/A; 03/18/2023: HIP PINNING,CANNULATED; Right     Comment:  Procedure: PERCUTANEOUS FIXATION OF FEMORAL NECK;                Surgeon: Onesimo Oneil LABOR, MD;  Location: [facility];  Service:              Orthopedics;  Laterality: Right; 12/06/2020: LEFT HEART CATH AND CORONARY ANGIOGRAPHY; N/A     Comment:  Procedure: LEFT HEART CATH AND CORONARY ANGIOGRAPHY;                Surgeon: Elmira Newman PARAS, MD;  Location: [facility]              CV LAB;  Service: Cardiovascular;  Laterality: N/A; 07/15/2021: LEFT HEART CATH AND CORONARY ANGIOGRAPHY; N/A     Comment:  Procedure: LEFT HEART CATH AND CORONARY ANGIOGRAPHY;                Surgeon: Elmira Newman PARAS, MD;  Location: [facility]              CV LAB;  Service: Cardiovascular;  Laterality: N/A; No date: ROTATOR CUFF REPAIR No date: THYROIDECTOMY      Reproductive/Obstetrics negative OB ROS                              Anesthesia Physical Anesthesia Plan  ASA: 2  Anesthesia Plan: General   Post-op Pain Management:    Induction: Intravenous  PONV Risk Score and Plan: Propofol  infusion and TIVA  Airway Management Planned: Natural Airway and Nasal Cannula  Additional Equipment:   Intra-op Plan:   Post-operative Plan:   Informed Consent: I have reviewed the patients History and Physical, chart, labs and discussed the procedure including the risks, benefits and alternatives for the proposed anesthesia with the patient or authorized representative who has indicated his/her understanding and acceptance.     Dental Advisory Given  Plan Discussed with: Anesthesiologist, CRNA and Surgeon  Anesthesia Plan Comments: (Patient consented for risks of anesthesia including but not limited to:  - adverse reactions to medications - risk of airway placement if required - damage to eyes, teeth, lips or other oral mucosa - nerve damage due to positioning  - sore throat or hoarseness - Damage to heart, brain, nerves, lungs, other parts of body or loss of life  Patient voiced understanding and assent.)        Anesthesia Quick Evaluation  "

## 2024-04-14 NOTE — Anesthesia Postprocedure Evaluation (Signed)
"   Anesthesia Post Note  Patient: Monica Bell  Procedure(s) Performed: COLONOSCOPY BIOPSY, GI  Patient location during evaluation: Endoscopy Anesthesia Type: General Level of consciousness: awake and alert Pain management: pain level controlled Vital Signs Assessment: post-procedure vital signs reviewed and stable Respiratory status: spontaneous breathing, nonlabored ventilation and respiratory function stable Cardiovascular status: blood pressure returned to baseline and stable Postop Assessment: no apparent nausea or vomiting Anesthetic complications: no   No notable events documented.   Last Vitals:  Vitals:   04/14/24 1354 04/14/24 1404  BP: 129/72 (!) 141/77  Pulse: 84 84  Resp: (!) 22 19  Temp:    SpO2: 100% 100%    Last Pain:  Vitals:   04/14/24 1404  TempSrc:   PainSc: 0-No pain                 [name]      "

## 2024-04-18 LAB — SURGICAL PATHOLOGY

## 2024-05-11 NOTE — Progress Notes (Unsigned)
 Patient ID: Monica Bell                 DOB: 1960/11/30                    MRN: [id number]      HPI: Monica Bell is a 64 y.o. female patient referred to lipid clinic by Dr. Elmira. PMH is significant for HTN, HLD, T2DM, tobacco abuse, CAD (NSTEMI 11/2020), HFrEF (now HFimpEF), GERD.  At last office visit with Dr. Elmira on 03/22/24, the patient reported history of statin intolerance and reported not being on Repatha  anymore. LDL-C at that time was 127 and she was referred to lipid clinic for further management.   Last LDL in 2022 of 134 ***  At today's visit, ***  She does have a history of trying Repatha  in 2024, unsure why discontinued -- couldn't find via chart review, need to verify with patient  Verify LDL goal with Melissa, pretty sure it's <55 mg/dL considering past medical history and significant risk factors Def needs medication review Tobaco use Needs updated lipid panel, can't find more up to date one  Reviewed options for lowering LDL cholesterol, including ezetimibe, PCSK-9 inhibitors, bempedoic acid and inclisiran.  Discussed mechanisms of action, dosing, side effects and potential decreases in LDL cholesterol.  Also reviewed cost information and potential options for patient assistance.    Current Medications: none Intolerances: Statins (myalgia) Risk Factors: h/o CAD (NSTEMI), tobacco use, HTN, T2DM, h/o CVA with brother  LDL-C goal: <55 mg/dL *** ???  Diet: ***  Exercise: ***  Family History:  Family History  Problem Relation Age of Onset   Diabetes Mother    Cancer Mother    COPD Father    Hypertension Father    Hypertension Sister    Hypertension Sister    Hypertension Sister    Hypertension Brother    Hypertension Brother    Hypertension Brother    Hypertension Brother    Hypertension Brother    Stroke Brother    Cancer Brother     Social History:  Tobacco: active *** EtOH: ***  Labs: Lipid Panel     Component Value  Date/Time   CHOL 201 (H) 12/06/2020 0503   TRIG 78 12/06/2020 0503   HDL 51 12/06/2020 0503   CHOLHDL 3.9 12/06/2020 0503   VLDL 16 12/06/2020 0503   LDLCALC 134 (H) 12/06/2020 0503    Past Medical History:  Diagnosis Date   Abnormal mammogram    Allergic rhinitis due to pollen 08/01/2019   Arthritis 01/06/2019   osteoarthritis of both knees   BMI 28.0-28.9,adult 08/01/2019   Cystocele, unspecified (CODE)    Diabetes mellitus    GERD (gastroesophageal reflux disease)    Heart murmur    Hyperlipemia, mixed 12/14/2018   Hypertension    Hypothyroidism    Stress incontinence, female    Thyroid disease    Type 2 diabetes mellitus, without long-term current use of insulin  (HCC) 01/06/2019   Vitamin D deficiency 01/06/2019    Medications Ordered Prior to Encounter[1]  Allergies[2]  Assessment/Plan:  1. Hyperlipidemia -  No problem-specific Assessment & Plan notes found for this encounter.    Thank you, ***  Melissa D Maccia, Pharm.JONETTA SARAN, CPP [facility] of [facility] [address] [place], [place] [address]  Phone: [phone]; Fax: [phone]      [1]  Current Outpatient Medications on File Prior to Visit  Medication  Sig Dispense Refill   acetaminophen  (TYLENOL ) 325 MG tablet Take 2 tablets (650 mg total) by mouth every 6 (six) hours as needed for mild pain (pain score 1-3) or moderate pain (pain score 4-6) (or Fever >/= 100.4).     amoxicillin -clavulanate (AUGMENTIN ) 875-125 MG tablet Take 1 tablet by mouth every 12 (twelve) hours. (Patient not taking: Reported on 04/14/2024) 14 tablet 0   aspirin  EC (GNP ASPIRIN  LOW DOSE) 81 MG tablet Take 1 tablet (81 mg total) by mouth 2 (two) times daily. Swallow whole.     buPROPion  ER (WELLBUTRIN  SR) 100 MG 12 hr tablet Take 1 tablet (100 mg total) by mouth 2 (two) times daily. 180 tablet 3   Calcium  Carbonate-Vitamin D (CALTRATE 600+D PO) Take 1 tablet by mouth daily.      carvedilol  (COREG ) 6.25 MG tablet TAKE ONE TABLET BY MOUTH TWICE DAILY WITH A MEAL 180 tablet 3   cetirizine (ZYRTEC) 10 MG tablet Take 10 mg by mouth daily.     Evolocumab  (REPATHA  SURECLICK) 140 MG/ML SOAJ Inject 140 mg into the skin every 14 (fourteen) days. 6 mL 5   fluconazole  (DIFLUCAN ) 200 MG tablet Take 200 mg by mouth daily.     glipiZIDE (GLUCOTROL) 10 MG tablet Take 10 mg by mouth daily before breakfast.     guaiFENesin  (MUCINEX ) 600 MG 12 hr tablet Take 2 tablets (1,200 mg total) by mouth 2 (two) times daily. 20 tablet 0   insulin  aspart (NOVOLOG ) 100 UNIT/ML injection Inject 15 Units into the skin 3 (three) times daily with meals.     levothyroxine  (SYNTHROID ) 125 MCG tablet Take 125 mcg by mouth every morning.     losartan  (COZAAR ) 50 MG tablet Take 1 tablet (50 mg total) by mouth daily. 90 tablet 3   magnesium  oxide (MAG-OX) 400 MG tablet Take 400 mg by mouth daily.     nicotine  polacrilex (NICORETTE ) 2 MG gum Take 1 each (2 mg total) by mouth as needed for smoking cessation. 100 tablet 0   nitroGLYCERIN  (NITROSTAT ) 0.4 MG SL tablet Place 1 tablet (0.4 mg total) under the tongue every 5 (five) minutes as needed for chest pain. 30 tablet 3   omeprazole  (PRILOSEC) 20 MG capsule Take 1 capsule (20 mg total) by mouth daily. 30 capsule 2   polyethylene glycol (MIRALAX  / GLYCOLAX ) 17 g packet Take 17 g by mouth daily. 14 each 0   TRESIBA FLEXTOUCH 200 UNIT/ML FlexTouch Pen Inject 55 Units into the skin daily.     vitamin B-12 (CYANOCOBALAMIN ) 1000 MCG tablet Take 1,000 mcg by mouth daily.     No current facility-administered medications on file prior to visit.  [2]  Allergies Allergen Reactions   Codeine Nausea Only   Rosuvastatin      Other Reaction(s): muscle cramps

## 2024-05-12 ENCOUNTER — Ambulatory Visit: Admitting: Pharmacist

## 2024-06-29 ENCOUNTER — Ambulatory Visit: Admitting: Cardiology
